# Patient Record
Sex: Male | Born: 1960 | Race: White | Hispanic: No | Marital: Married | Smoking: Former smoker
Health system: Southern US, Community
[De-identification: ages and names within clinical notes are randomized; demographics above are authoritative.]

## PROBLEM LIST (undated history)

## (undated) DIAGNOSIS — J189 Pneumonia, unspecified organism: Secondary | ICD-10-CM

## (undated) DIAGNOSIS — E119 Type 2 diabetes mellitus without complications: Secondary | ICD-10-CM

## (undated) DIAGNOSIS — I2510 Atherosclerotic heart disease of native coronary artery without angina pectoris: Secondary | ICD-10-CM

## (undated) DIAGNOSIS — I472 Ventricular tachycardia, unspecified: Secondary | ICD-10-CM

## (undated) DIAGNOSIS — Z87442 Personal history of urinary calculi: Secondary | ICD-10-CM

## (undated) DIAGNOSIS — E785 Hyperlipidemia, unspecified: Secondary | ICD-10-CM

## (undated) DIAGNOSIS — G4730 Sleep apnea, unspecified: Secondary | ICD-10-CM

## (undated) DIAGNOSIS — I4729 Other ventricular tachycardia: Secondary | ICD-10-CM

## (undated) DIAGNOSIS — I10 Essential (primary) hypertension: Secondary | ICD-10-CM

## (undated) DIAGNOSIS — E7800 Pure hypercholesterolemia, unspecified: Secondary | ICD-10-CM

## (undated) DIAGNOSIS — I252 Old myocardial infarction: Secondary | ICD-10-CM

## (undated) HISTORY — DX: Other ventricular tachycardia: I47.29

## (undated) HISTORY — DX: Essential (primary) hypertension: I10

## (undated) HISTORY — DX: Ventricular tachycardia, unspecified: I47.20

## (undated) HISTORY — PX: CORONARY STENT PLACEMENT: SHX1402

## (undated) HISTORY — DX: Pure hypercholesterolemia, unspecified: E78.00

## (undated) HISTORY — DX: Type 2 diabetes mellitus without complications: E11.9

## (undated) HISTORY — DX: Old myocardial infarction: I25.2

## (undated) HISTORY — DX: Ventricular tachycardia: I47.2

## (undated) HISTORY — DX: Pneumonia, unspecified organism: J18.9

## (undated) HISTORY — DX: Hyperlipidemia, unspecified: E78.5

## (undated) HISTORY — PX: CARDIAC CATHETERIZATION: SHX172

## (undated) HISTORY — DX: Atherosclerotic heart disease of native coronary artery without angina pectoris: I25.10

---

## 2001-05-17 ENCOUNTER — Inpatient Hospital Stay (HOSPITAL_COMMUNITY): Admission: EM | Admit: 2001-05-17 | Discharge: 2001-05-21 | Payer: Self-pay | Admitting: *Deleted

## 2001-05-17 ENCOUNTER — Encounter: Payer: Self-pay | Admitting: Nephrology

## 2004-04-26 ENCOUNTER — Ambulatory Visit (HOSPITAL_COMMUNITY): Admission: RE | Admit: 2004-04-26 | Discharge: 2004-04-26 | Payer: Self-pay | Admitting: Family Medicine

## 2004-04-26 ENCOUNTER — Emergency Department (HOSPITAL_COMMUNITY): Admission: EM | Admit: 2004-04-26 | Discharge: 2004-04-26 | Payer: Self-pay | Admitting: Family Medicine

## 2005-07-16 ENCOUNTER — Ambulatory Visit: Payer: Self-pay | Admitting: Cardiology

## 2005-07-16 ENCOUNTER — Inpatient Hospital Stay (HOSPITAL_COMMUNITY): Admission: EM | Admit: 2005-07-16 | Discharge: 2005-07-20 | Payer: Self-pay | Admitting: Emergency Medicine

## 2005-07-26 ENCOUNTER — Ambulatory Visit: Payer: Self-pay

## 2005-07-26 ENCOUNTER — Encounter: Payer: Self-pay | Admitting: Cardiology

## 2005-08-11 ENCOUNTER — Ambulatory Visit: Payer: Self-pay | Admitting: Cardiology

## 2005-08-25 ENCOUNTER — Ambulatory Visit: Payer: Self-pay | Admitting: Cardiology

## 2005-10-25 ENCOUNTER — Ambulatory Visit: Payer: Self-pay | Admitting: Cardiology

## 2005-11-17 ENCOUNTER — Ambulatory Visit: Payer: Self-pay | Admitting: Cardiology

## 2006-05-25 ENCOUNTER — Ambulatory Visit: Payer: Self-pay | Admitting: Cardiology

## 2006-05-27 ENCOUNTER — Ambulatory Visit: Payer: Self-pay | Admitting: Cardiology

## 2006-07-19 ENCOUNTER — Ambulatory Visit: Payer: Self-pay | Admitting: Cardiology

## 2006-07-19 LAB — CONVERTED CEMR LAB
ALT: 27 units/L (ref 0–40)
AST: 21 units/L (ref 0–37)
Albumin: 4.2 g/dL (ref 3.5–5.2)
Alkaline Phosphatase: 35 units/L — ABNORMAL LOW (ref 39–117)
Bilirubin, Direct: 0.1 mg/dL (ref 0.0–0.3)
HDL: 31.9 mg/dL — ABNORMAL LOW (ref >39.0)
Total Protein: 7.3 g/dL (ref 6.0–8.3)

## 2006-08-06 ENCOUNTER — Inpatient Hospital Stay (HOSPITAL_COMMUNITY): Admission: EM | Admit: 2006-08-06 | Discharge: 2006-08-09 | Payer: Self-pay | Admitting: Emergency Medicine

## 2006-08-08 ENCOUNTER — Ambulatory Visit: Payer: Self-pay | Admitting: Internal Medicine

## 2006-09-08 ENCOUNTER — Ambulatory Visit: Payer: Self-pay | Admitting: Cardiology

## 2006-09-14 ENCOUNTER — Ambulatory Visit: Payer: Self-pay | Admitting: Cardiology

## 2006-09-14 LAB — CONVERTED CEMR LAB
ALT: 28 units/L (ref 0–40)
AST: 17 units/L (ref 0–37)
HDL: 27.5 mg/dL — ABNORMAL LOW (ref >39.0)
Total Protein: 7 g/dL (ref 6.0–8.3)

## 2007-10-06 ENCOUNTER — Emergency Department (HOSPITAL_COMMUNITY): Admission: EM | Admit: 2007-10-06 | Discharge: 2007-10-06 | Payer: Self-pay | Admitting: Family Medicine

## 2009-01-13 ENCOUNTER — Telehealth: Payer: Self-pay | Admitting: Cardiology

## 2009-04-15 DIAGNOSIS — I2510 Atherosclerotic heart disease of native coronary artery without angina pectoris: Secondary | ICD-10-CM | POA: Insufficient documentation

## 2009-04-15 DIAGNOSIS — I10 Essential (primary) hypertension: Secondary | ICD-10-CM | POA: Insufficient documentation

## 2009-04-15 DIAGNOSIS — I252 Old myocardial infarction: Secondary | ICD-10-CM

## 2009-04-15 DIAGNOSIS — I472 Ventricular tachycardia: Secondary | ICD-10-CM

## 2009-04-15 DIAGNOSIS — E785 Hyperlipidemia, unspecified: Secondary | ICD-10-CM

## 2009-04-18 ENCOUNTER — Ambulatory Visit: Payer: Self-pay | Admitting: Cardiology

## 2009-04-22 ENCOUNTER — Ambulatory Visit: Payer: Self-pay | Admitting: Cardiology

## 2009-04-22 ENCOUNTER — Encounter (INDEPENDENT_AMBULATORY_CARE_PROVIDER_SITE_OTHER): Payer: Self-pay

## 2009-04-22 ENCOUNTER — Ambulatory Visit: Payer: Self-pay

## 2009-04-23 LAB — CONVERTED CEMR LAB
BUN: 16 mg/dL (ref 6–23)
Basophils Absolute: 0.1 10*3/uL (ref 0.0–0.1)
Eosinophils Absolute: 0.2 10*3/uL (ref 0.0–0.7)
Glucose, Bld: 120 mg/dL — ABNORMAL HIGH (ref 70–99)
HCT: 47.6 % (ref 39.0–52.0)
MCHC: 35.1 g/dL (ref 30.0–36.0)
MCV: 86.3 fL (ref 78.0–100.0)
Monocytes Absolute: 0.6 10*3/uL (ref 0.1–1.0)
Neutrophils Relative %: 57.2 % (ref 43.0–77.0)
Potassium: 3.9 meq/L (ref 3.5–5.1)
RDW: 12.6 % (ref 11.5–14.6)
Sodium: 140 meq/L (ref 135–145)

## 2009-04-24 ENCOUNTER — Observation Stay (HOSPITAL_COMMUNITY): Admission: AD | Admit: 2009-04-24 | Discharge: 2009-04-25 | Payer: Self-pay | Admitting: Orthopedic Surgery

## 2009-04-24 ENCOUNTER — Ambulatory Visit: Payer: Self-pay | Admitting: Cardiology

## 2009-04-30 ENCOUNTER — Encounter (INDEPENDENT_AMBULATORY_CARE_PROVIDER_SITE_OTHER): Payer: Self-pay | Admitting: *Deleted

## 2009-05-01 ENCOUNTER — Ambulatory Visit: Payer: Self-pay

## 2009-05-01 ENCOUNTER — Encounter: Payer: Self-pay | Admitting: Cardiology

## 2009-05-01 ENCOUNTER — Ambulatory Visit: Payer: Self-pay | Admitting: Cardiovascular Disease

## 2009-05-01 ENCOUNTER — Ambulatory Visit (HOSPITAL_COMMUNITY): Admission: RE | Admit: 2009-05-01 | Discharge: 2009-05-01 | Payer: Self-pay | Admitting: Cardiology

## 2009-05-02 ENCOUNTER — Encounter: Payer: Self-pay | Admitting: Cardiology

## 2009-05-07 ENCOUNTER — Encounter (INDEPENDENT_AMBULATORY_CARE_PROVIDER_SITE_OTHER): Payer: Self-pay | Admitting: *Deleted

## 2009-05-07 ENCOUNTER — Ambulatory Visit: Payer: Self-pay | Admitting: Cardiology

## 2009-07-16 ENCOUNTER — Telehealth: Payer: Self-pay | Admitting: Cardiology

## 2009-07-29 ENCOUNTER — Ambulatory Visit: Payer: Self-pay | Admitting: Cardiology

## 2009-09-16 ENCOUNTER — Ambulatory Visit: Payer: Self-pay | Admitting: Cardiology

## 2009-10-03 LAB — CONVERTED CEMR LAB: Alpha-1-Globulin: 6.8 % — ABNORMAL HIGH (ref 2.9–4.9)

## 2010-01-20 ENCOUNTER — Telehealth (INDEPENDENT_AMBULATORY_CARE_PROVIDER_SITE_OTHER): Payer: Self-pay | Admitting: *Deleted

## 2010-06-16 ENCOUNTER — Emergency Department (HOSPITAL_COMMUNITY)
Admission: EM | Admit: 2010-06-16 | Discharge: 2010-06-16 | Payer: Self-pay | Source: Home / Self Care | Admitting: Emergency Medicine

## 2010-06-24 ENCOUNTER — Ambulatory Visit
Admission: RE | Admit: 2010-06-24 | Discharge: 2010-06-24 | Payer: Self-pay | Source: Home / Self Care | Attending: Physician Assistant | Admitting: Physician Assistant

## 2010-06-24 ENCOUNTER — Encounter: Payer: Self-pay | Admitting: Physician Assistant

## 2010-06-24 DIAGNOSIS — F17200 Nicotine dependence, unspecified, uncomplicated: Secondary | ICD-10-CM | POA: Insufficient documentation

## 2010-06-24 DIAGNOSIS — R5381 Other malaise: Secondary | ICD-10-CM | POA: Insufficient documentation

## 2010-06-24 DIAGNOSIS — G4730 Sleep apnea, unspecified: Secondary | ICD-10-CM | POA: Insufficient documentation

## 2010-06-24 DIAGNOSIS — R5383 Other fatigue: Secondary | ICD-10-CM

## 2010-06-24 DIAGNOSIS — I517 Cardiomegaly: Secondary | ICD-10-CM | POA: Insufficient documentation

## 2010-07-10 ENCOUNTER — Ambulatory Visit
Admission: RE | Admit: 2010-07-10 | Discharge: 2010-07-10 | Payer: Self-pay | Source: Home / Self Care | Attending: Internal Medicine | Admitting: Internal Medicine

## 2010-07-10 ENCOUNTER — Ambulatory Visit
Admission: RE | Admit: 2010-07-10 | Discharge: 2010-07-10 | Payer: Self-pay | Source: Home / Self Care | Attending: Pulmonary Disease | Admitting: Pulmonary Disease

## 2010-07-10 DIAGNOSIS — J45909 Unspecified asthma, uncomplicated: Secondary | ICD-10-CM | POA: Insufficient documentation

## 2010-07-10 DIAGNOSIS — G4733 Obstructive sleep apnea (adult) (pediatric): Secondary | ICD-10-CM | POA: Insufficient documentation

## 2010-07-14 ENCOUNTER — Ambulatory Visit
Admission: RE | Admit: 2010-07-14 | Discharge: 2010-07-14 | Payer: Self-pay | Source: Home / Self Care | Attending: Internal Medicine | Admitting: Internal Medicine

## 2010-07-16 ENCOUNTER — Encounter: Payer: Self-pay | Admitting: Internal Medicine

## 2010-07-19 LAB — CONVERTED CEMR LAB
ALT: 32 units/L (ref 0–53)
AST: 17 units/L (ref 0–37)
Alkaline Phosphatase: 54 units/L (ref 39–117)

## 2010-07-21 NOTE — Assessment & Plan Note (Signed)
Summary: 3 mo f/u   Visit Type:  3 months follow up Primary Provider:  No PCP at this time  CC:  No complains.  History of Present Illness: Started walking and exercising and no chest pain.  Did two miles without difficulty.  Doing alot better.  Lots of recent family stress with death of a family member.  Really taking better care of self at this point.  Denies any significant chest pain.    Current Medications (verified): 1)  Crestor 20 Mg Tabs (Rosuvastatin Calcium) .... Take One Tablet By Mouth Daily. 2)  Aspirin Ec 325 Mg Tbec (Aspirin) .... Take One Tablet By Mouth Daily 3)  Nitrostat 0.4 Mg Subl (Nitroglycerin) .Marland Kitchen.. 1 Tablet Under Tongue At Onset of Chest Pain; You May Repeat Every 5 Minutes For Up To 3 Doses. 4)  Metoprolol Tartrate 25 Mg Tabs (Metoprolol Tartrate) .Marland Kitchen.. 1 Tab Two Times A Day 5)  Plavix 75 Mg Tabs (Clopidogrel Bisulfate) .Marland Kitchen.. 1 Tab Once Daily  Allergies (verified): No Known Drug Allergies  Vital Signs:  Patient profile:   50 year old male Height:      66 inches Weight:      196.25 pounds BMI:     31.79 Pulse rate:   71 / minute Pulse rhythm:   regular Resp:     18 per minute BP sitting:   122 / 88  (left arm) Cuff size:   large  Vitals Entered By: Vikki Ports (July 29, 2009 11:26 AM)  Physical Exam  General:  Well developed, well nourished, in no acute distress. Head:  normocephalic and atraumatic Lungs:  Clear bilaterally to auscultation and percussion. Heart:  PMI non displaced.  Normal S1 and S2.  No murmur, rub.  Pos S4 gallop.   Extremities:  No clubbing or cyanosis. Neurologic:  Alert and oriented x 3.   EKG  Procedure date:  07/29/2009  Findings:      NSR.  Nonspecific St abnormality.  Impression & Recommendations:  Problem # 1:  CAD (ICD-414.00) Remains stable.  Had DES for ISR with Cypher given lesion lenght.  Need for DAPT stressed to patient as well as reasons.  Continue medical therapy and reinforce good habits.   His  updated medication list for this problem includes:    Aspirin Ec 325 Mg Tbec (Aspirin) .Marland Kitchen... Take one tablet by mouth daily    Nitrostat 0.4 Mg Subl (Nitroglycerin) .Marland Kitchen... 1 tablet under tongue at onset of chest pain; you may repeat every 5 minutes for up to 3 doses.    Metoprolol Tartrate 25 Mg Tabs (Metoprolol tartrate) .Marland Kitchen... 1 tab two times a day    Plavix 75 Mg Tabs (Clopidogrel bisulfate) .Marland Kitchen... 1 tab once daily  Problem # 2:  HYPERTENSION, UNSPECIFIED (ICD-401.9) Reasonable control at present.  Continue current medications. His updated medication list for this problem includes:    Aspirin Ec 325 Mg Tbec (Aspirin) .Marland Kitchen... Take one tablet by mouth daily    Metoprolol Tartrate 25 Mg Tabs (Metoprolol tartrate) .Marland Kitchen... 1 tab two times a day  Problem # 3:  HYPERLIPIDEMIA (ICD-272.4) Check liver panel.   His updated medication list for this problem includes:    Crestor 20 Mg Tabs (Rosuvastatin calcium) .Marland Kitchen... Take one tablet by mouth daily.  Orders: TLB-Hepatic/Liver Function Pnl ([id number]-[id number])  Patient Instructions: 1)  Your physician recommends that you schedule a follow-up appointment in: 6 months with Dr. Riley Kill. 2)  Your physician recommends that you getr lab work today. 3)  Your  physician recommends that you continue on your current medications as directed. Please refer to the Current Medication list given to you today.

## 2010-07-21 NOTE — Progress Notes (Signed)
Summary: loss letter for gym, appt today  Phone Note Call from Patient Call back at Home Phone [phone]   Caller: Patient Reason for Call: Talk to Nurse Details for Reason: pt has appt today - gym. pt loss letter. need another Initial call taken by: Lorne Skeens,  July 16, 2009 12:49 PM  Follow-up for Phone Call        COPY OF LETTER LEFT AT FRONT DESK  PT'S WIFE AWARE Follow-up by: Scherrie Bateman, LPN,  July 16, 2009 1:12 PM

## 2010-07-21 NOTE — Progress Notes (Signed)
Summary: gym letter  Phone Note Call from Patient Call back at Home Phone [phone] Call back at [phone]   Caller: Sister/Debra Summary of Call: letter for gym has to be on prescription pad, please fax to [phone] Initial call taken by: Migdalia Dk,  July 16, 2009 2:38 PM  Follow-up for Phone Call        Phone Call Completed faxed note to gym. Follow-up by: Scherrie Bateman, LPN,  July 16, 2009 2:54 PM

## 2010-07-21 NOTE — Letter (Signed)
Summary: [facility]   Imported By: Kassie Mends 10/01/2009 10:35:48  _____________________________________________________________________  External Attachment:    Type:   Image     Comment:   External Document

## 2010-07-21 NOTE — Letter (Signed)
Summary: [facility] Progress Note  [facility]   Imported By: Roderic Ovens 09/25/2009 16:48:33  _____________________________________________________________________  External Attachment:    Type:   Image     Comment:   External Document

## 2010-07-21 NOTE — Progress Notes (Signed)
Summary: refill request  Phone Note Refill Request Message from:  Patient on January 20, 2010 9:02 AM  Refills Requested: Medication #1:  CRESTOR 20 MG TABS Take one tablet by mouth daily.  Medication #2:  METOPROLOL TARTRATE 25 MG TABS 1 tab two times a day  Medication #3:  PLAVIX 75 MG TABS 1 tab once daily. needs 3 month supply of all three meds, [facility] [address] [facility]pt changed insurance now has to get 34mo supply and is out of two of them now-call if any problems pt#[phone]   Method Requested: Telephone to Pharmacy Initial call taken by: Glynda Jaeger,  January 20, 2010 9:05 AM  Follow-up for Phone Call        Rx called to pharmacy. Vikki Ports  January 20, 2010 10:52 AM     Prescriptions: PLAVIX 75 MG TABS (CLOPIDOGREL BISULFATE) 1 tab once daily  #90 x 3   Entered by:   Vikki Ports   Authorized by:   Ronaldo Miyamoto, MD, [facility]   Signed by:   Vikki Ports on 01/20/2010   Method used:   [facility]ed to ...       [facility] [id number]       [address]       [place], [place]  [address]       Ph: [phone]       Fax: [phone]   RxID:   [id number] METOPROLOL TARTRATE 25 MG TABS (METOPROLOL TARTRATE) 1 tab two times a day  #180 x 3   Entered by:   Vikki Ports   Authorized by:   Ronaldo Miyamoto, MD, [facility]   Signed by:   Vikki Ports on 01/20/2010   Method used:   Faxed to ...       [facility] [id number]       [address]       [place], [place]  [address]       Ph: [phone]       Fax: [phone]   RxID:   [id number] CRESTOR 20 MG TABS (ROSUVASTATIN CALCIUM) Take one tablet by mouth daily.  #90 x 3   Entered by:   Vikki Ports   Authorized by:   Ronaldo Miyamoto, MD, [facility]   Signed by:   Vikki Ports on 01/20/2010   Method used:   Faxed to ...       [facility] [id number]       [address]       [place], [place]  [address]       Ph: [phone]       Fax: [phone]   RxID:   [id number]

## 2010-07-23 NOTE — Miscellaneous (Signed)
Summary: Orders Update pft charges   Clinical Lists Changes  Orders: Added new Service order of Carbon Monoxide diffusing w/capacity (94729) - Signed Added new Service order of Lung Volumes/Gas dilution or washout (94727) - Signed Added new Service order of Spirometry (Pre & Post) (94060) - Signed 

## 2010-07-23 NOTE — Assessment & Plan Note (Addendum)
Summary: pt went to ED for a cold & was told he has enlarged heart   Visit Type:  Follow-up Primary Provider:  No PCP at this time  CC:  no cardiac complaints.  History of Present Illness: Primary Cardiologist:  Dr. Shawnie Riggs  [name] is a 50 yo male with a h/o CAD, s/p stenting to the CFX and RCA in the past, last cath in 04/2009 demonstrating 90% in stent restenosis in the RCA which was treated with a DES (Cypher) and preserved LVF (EF 55%).  He recently presented to the emergency room and was diagnosed with acute bronchitis.  BNP was < 30 and CXR demonstrated cardiomegaly, no edema and bronchitic changes.  He was concerned about the cardiomegaly on his CXR and arranged this appt.  He is feeling much better since starting on tx for his bronchitis.  He is trying to quit smoking.  He denies chest discomfort reminiscent of his prior angina.  No arm or jaw pain.  He did note dyspnea with his bronchitis but this is getting better.  No orthopnea or PND.  No syncope.    Current Medications (verified): 1)  Crestor 20 Mg Tabs (Rosuvastatin Calcium) .... Take One Tablet By Mouth Daily. 2)  Aspirin Ec 325 Mg Tbec (Aspirin) .... Take One Tablet By Mouth Daily 3)  Nitrostat 0.4 Mg Subl (Nitroglycerin) .Marland Kitchen.. 1 Tablet Under Tongue At Onset of Chest Pain; You May Repeat Every 5 Minutes For Up To 3 Doses. 4)  Metoprolol Tartrate 25 Mg Tabs (Metoprolol Tartrate) .Marland Kitchen.. 1 Tab Two Times A Day 5)  Plavix 75 Mg Tabs (Clopidogrel Bisulfate) .Marland Kitchen.. 1 Tab Once Daily 6)  Prednisone 5 Mg Tabs (Prednisone) .... Use As Directed 7)  Iophen C-Nr 100-10 Mg/24ml Liqd (Guaifenesin-Codeine) .... Take 1 To 2 Teaspoons By Mouth Every 6 Hours As Needed For Cough 8)  Amoxicillin-Pot Clavulanate 875-125 Mg Tabs (Amoxicillin-Pot Clavulanate) .... Tak E1 Tablet Two Times A Day With Food 9)  Proair Hfa 108 [phone] Base) Mcg/act Aers (Albuterol Sulfate) .... Inhale 2 Puffs Every 4 Hours As Needed 10)  Acetaminophen-Codeine #3  300-30 Mg Tabs (Acetaminophen-Codeine) .... Take On or 2 Tablets Every 4 Ours As Needed  Allergies (verified): No Known Drug Allergies  Past History:  Past Medical History: Current Problems:  CAD (ICD-414.00)   a. s/p BMS to RCA and CFX   b.  s/p DES to RCA 2/2 ISR 2010 Good LVF HYPERTENSION, UNSPECIFIED (ICD-401.9) HYPERLIPIDEMIA (ICD-272.4) MYOCARDIAL INFARCTION, HX OF (ICD-412) VENTRICULAR TACHYCARDIA (ICD-427.1) HX  Social History: Reviewed history from 04/15/2009 and no changes required.  The patient works as a Education administrator.  He has a history of  tobacco abuse.  He denies alcohol abuse.  He is married.  Review of Systems       His wife reports h/o snoring and witnessed apneic episodes.  He describes daytime hypersomnolence and fatigue.  Otherwise, as per  the HPI.  All other systems reviewed and negative.   Vital Signs:  Patient profile:   50 year old male Height:      66 inches Weight:      199 pounds BMI:     32.24 Pulse rate:   89 / minute BP sitting:   132 / 74  (left arm)  Vitals Entered By: [name], CMA (June 24, 2010 9:35 AM)  Physical Exam  General:  Well nourished, well developed, in no acute distress HEENT: normal Neck: no JVD Cardiac:  normal S1, S2; RRR; no murmur  Lungs:  clear to auscultation bilaterally, no wheezing, rhonchi or rales Abd: soft, nontender, no hepatomegaly Ext: no edema Skin: warm and dry Neuro:  CNs 2-12 intact, no focal abnormalities noted    EKG  Procedure date:  06/24/2010  Findings:      Normal Sinus Rhythm Heart rate 89 Normal axis Nonspecific ST-T wave changes No significant change when compared to prior tracings.  Impression & Recommendations:  Problem # 1:  CARDIOMEGALY (ICD-429.3) I reviewed his prior CXRs with him and it appears his heart size is stable.  His echo in 2010 was notable for normal LVF and Grade 1 diast. dysfxn.  He had a negative BNP and no signs of CHF.  His breathing is improved with  treatment of his bronchitis.  I spent a lot of time explaining measurement of heart size on CXRs and when this is significant.  At this time, I do not think he needs any further testing.  I reassured him and his wife.  Problem # 2:  CAD (ICD-414.00) No angina. Continue ASA, Plavix and Statin.  Orders: EKG w/ Interpretation (93000)  Problem # 3:  HYPERTENSION, UNSPECIFIED (ICD-401.9) Controlled.  Orders: Primary Care Referral (Primary)  Problem # 4:  FATIGUE (ICD-780.79) Refer to sleep med specialist as I think he has symptoms of sleep apnea; espeially with mild CM noted on CXR.  Problem # 5:  SMOKER (ICD-305.1) He is committed to quiting.  Other Orders: Sleep Disorder Referral (Sleep Disorder)  Patient Instructions: 1)  David Newcomer, PA recommends for you to be evaluated by eith [name] or [name] for possible sleep apnea 2)  You have been referred to PRIMARY CARE AT OUR [facility] 3)  Your physician recommends that you continue on your current medications as directed. Please refer to the Current Medication list given to you today. 4)  Your physician recommends that you schedule a follow-up appointment in: 3 MONTHS WITH [name]

## 2010-07-23 NOTE — Assessment & Plan Note (Signed)
Summary: NEW/ [facility] / NWS  #   Vital Signs:  Patient profile:   50 year old male Height:      66 inches Weight:      198 pounds BMI:     32.07 O2 Sat:      92 % Temp:     98.5 degrees F oral Pulse rate:   92 / minute Pulse rhythm:   regular Resp:     16 per minute BP sitting:   120 / 72  (left arm) Cuff size:   regular  Vitals Entered By: Lanier Prude, CMA(AAMA) (July 10, 2010 1:22 PM)  Nutrition Counseling: Patient's BMI is greater than 25 and therefore counseled on weight management options. CC: Est PCP c/o recurrent cough, Cough Is Patient Diabetic? No   Primary Care Provider:  Yetta Barre  CC:  Est PCP c/o recurrent cough and Cough.  History of Present Illness:  Cough      This is a 50 year old man who presents with Cough.  The symptoms began 2 months ago.  The intensity is described as moderate.  The patient reports productive cough, shortness of breath, wheezing, and exertional dyspnea, but denies non-productive cough, pleuritic chest pain, fever, hemoptysis, and malaise.  Associated symtpoms include cold/URI symptoms, sore throat, nasal congestion, and chronic rhinitis.  The patient denies the following symptoms: weight loss, acid reflux symptoms, and peripheral edema.  The cough is worse with cold exposure.  Ineffective prior treatments have included throat lozenges and albuterol inhaler.  Risk factors include history of asthma.  Diagnostic testing to date has included CXR.  He tells me that he went to an [facility] about 3 weeks ago and was prescribed amoxil, prednisone, albuterol, a pain pill, and a cough med and was getting better until about 5 days ago when his symptons returned with a vengeance.  Preventive Screening-Counseling & Management  Alcohol-Tobacco     Alcohol drinks/day: 0     Alcohol Counseling: not indicated; patient does not drink     Smoking Status: current     Smoking Cessation Counseling: yes     Smoke Cessation Stage: precontemplative     Packs/Day:  2.0     Year Started: 1980     Pack years: 35     Tobacco Counseling: to quit use of tobacco products  Caffeine-Diet-Exercise     Does Patient Exercise: no  Hep-HIV-STD-Contraception     Hepatitis Risk: no risk noted     HIV Risk: no risk noted     STD Risk: no risk noted      Sexual History:  currently monogamous.        Drug Use:  no.        Blood Transfusions:  no.    Clinical Review Panels:  Lipid Management   Cholesterol:  102 (09/14/2006)   LDL (bad choesterol):  58 (09/14/2006)   HDL (good cholesterol):  27.5 (09/14/2006)  Diabetes Management   Creatinine:  0.8 (04/22/2009)  CBC   WBC:  9.1 (04/22/2009)   RBC:  5.51 (04/22/2009)   Hgb:  16.7 (04/22/2009)   Hct:  47.6 (04/22/2009)   Platelets:  228.0 (04/22/2009)   MCV  86.3 (04/22/2009)   MCHC  35.1 (04/22/2009)   RDW  12.6 (04/22/2009)   PMN:  57.2 (04/22/2009)   Lymphs:  32.9 (04/22/2009)   Monos:  6.8 (04/22/2009)   Eosinophils:  2.5 (04/22/2009)   Basophil:  0.6 (04/22/2009)  Complete Metabolic Panel   Glucose:  120 (04/22/2009)  Sodium:  140 (04/22/2009)   Potassium:  3.9 (04/22/2009)   Chloride:  104 (04/22/2009)   CO2:  27 (04/22/2009)   BUN:  16 (04/22/2009)   Creatinine:  0.8 (04/22/2009)   Albumin:  4.3 (07/29/2009)   Total Protein:  8.5 (07/29/2009)   Calcium:  9.4 (04/22/2009)   Total Bili:  0.5 (07/29/2009)   Alk Phos:  54 (07/29/2009)   SGPT (ALT):  32 (07/29/2009)   SGOT (AST):  17 (07/29/2009)   Medications Prior to Update: 1)  Crestor 20 Mg Tabs (Rosuvastatin Calcium) .... Take One Tablet By Mouth Daily. 2)  Aspirin Ec 325 Mg Tbec (Aspirin) .... Take One Tablet By Mouth Daily 3)  Nitrostat 0.4 Mg Subl (Nitroglycerin) .Marland Kitchen.. 1 Tablet Under Tongue At Onset of Chest Pain; You May Repeat Every 5 Minutes For Up To 3 Doses. 4)  Metoprolol Tartrate 25 Mg Tabs (Metoprolol Tartrate) .Marland Kitchen.. 1 Tab Two Times A Day 5)  Plavix 75 Mg Tabs (Clopidogrel Bisulfate) .Marland Kitchen.. 1 Tab Once Daily 6)  Proair  Hfa 108 (90 Base) Mcg/act Aers (Albuterol Sulfate) .... Inhale 2 Puffs Every 4 Hours As Needed  Current Medications (verified): 1)  Crestor 20 Mg Tabs (Rosuvastatin Calcium) .... Take One Tablet By Mouth Daily. 2)  Aspirin Ec 325 Mg Tbec (Aspirin) .... Take One Tablet By Mouth Daily 3)  Nitrostat 0.4 Mg Subl (Nitroglycerin) .Marland Kitchen.. 1 Tablet Under Tongue At Onset of Chest Pain; You May Repeat Every 5 Minutes For Up To 3 Doses. 4)  Metoprolol Tartrate 25 Mg Tabs (Metoprolol Tartrate) .Marland Kitchen.. 1 Tab Two Times A Day 5)  Plavix 75 Mg Tabs (Clopidogrel Bisulfate) .Marland Kitchen.. 1 Tab Once Daily 6)  Proair Hfa 108 (90 Base) Mcg/act Aers (Albuterol Sulfate) .... Inhale 2 Puffs Every 4 Hours As Needed 7)  Dulera 200-5 Mcg/act Aero (Mometasone Furo-Formoterol Fum) .... 2 Puffs Two Times A Day 8)  Avelox 400 Mg Tabs (Moxifloxacin Hcl) .... One By Mouth Once Daily For 10 Days 9)  Tussicaps 10-8 Mg Xr12h-Cap (Hydrocod Polst-Chlorphen Polst) .... One By Mouth Two Times A Day As Needed For Cough  Allergies (verified): No Known Drug Allergies  Past History:  Past Medical History: Current Problems:  CAD (ICD-414.00)   a. s/p BMS to RCA and CFX   b.  s/p DES to RCA 2/2 ISR 2010 Good LVF HYPERTENSION, UNSPECIFIED (ICD-401.9) HYPERLIPIDEMIA (ICD-272.4) MYOCARDIAL INFARCTION, HX OF (ICD-412) VENTRICULAR TACHYCARDIA (ICD-427.1) HX Asthma  Past Surgical History: Reviewed history from 04/15/2009 and no changes required.  stenting x2 in January of 2007.  The patient also had a right coronary stent in 2002.  Family History: Reviewed history from 04/15/2009 and no changes required.  His family history is positive for coronary artery disease.  Social History: Reviewed history from 04/15/2009 and no changes required.  The patient works as a Education administrator.  He has a history of  tobacco abuse.  He denies alcohol abuse.  He is married. Drug use-no Regular exercise-no Packs/Day:  2.0 Hepatitis Risk:  no risk noted HIV Risk:   no risk noted STD Risk:  no risk noted Sexual History:  currently monogamous Blood Transfusions:  no Drug Use:  no Does Patient Exercise:  no  Review of Systems       The patient complains of weight gain and prolonged cough.  The patient denies anorexia, fever, weight loss, decreased hearing, hoarseness, chest pain, syncope, peripheral edema, headaches, hemoptysis, abdominal pain, suspicious skin lesions, difficulty walking, depression, abnormal bleeding, enlarged lymph nodes, and angioedema.  General:  Complains of malaise and sleep disorder; denies chills, fatigue, fever, loss of appetite, sweats, and weakness. Resp:  Complains of cough, excessive snoring, hypersomnolence, shortness of breath, sputum productive, and wheezing; denies chest discomfort, chest pain with inspiration, coughing up blood, and pleuritic.  Physical Exam  General:  ruddy appearance, alert, well-developed, well-nourished, well-hydrated, appropriate dress, cooperative to examination, good hygiene, and overweight-appearing.   Head:  normocephalic, atraumatic, no abnormalities observed, and no abnormalities palpated.   Eyes:  vision grossly intact, pupils equal, and no injection.   Ears:  R ear normal and L ear normal.   Nose:  no external deformity, no nasal discharge, no mucosal pallor, no mucosal edema, no airflow obstruction, no intranasal foreign body, no nasal polyps, no nasal mucosal lesions, no mucosal friability, no active bleeding or clots, and no sinus percussion tenderness.   Mouth:  good dentition, no exudates, no posterior lymphoid hypertrophy, no postnasal drip, no pharyngeal crowing, no lesions, no aphthous ulcers, no erosions, no tongue abnormalities, no leukoplakia, no petechiae, and pharyngeal erythema.   Neck:  supple, full ROM, no masses, no thyromegaly, no thyroid nodules or tenderness, no JVD, normal carotid upstroke, no carotid bruits, no cervical lymphadenopathy, and no neck tenderness.   Lungs:   he has diffuse bilateral inspiratory wet rhonchi that clear with cough and some inspiratory wheezes, there are rales in the left base, he has good air movement. normal respiratory effort.   Heart:  normal rate, regular rhythm, no murmur, no gallop, no rub, and no JVD.   Abdomen:  soft, non-tender, normal bowel sounds, no distention, no masses, no guarding, no rigidity, no rebound tenderness, no abdominal hernia, no inguinal hernia, no hepatomegaly, and no splenomegaly.   Msk:  No deformity or scoliosis noted of thoracic or lumbar spine.   Pulses:  R and L carotid,radial,femoral,dorsalis pedis and posterior tibial pulses are full and equal bilaterally Extremities:  No clubbing, cyanosis, edema, or deformity noted with normal full range of motion of all joints.   Neurologic:  No cranial nerve deficits noted. Station and gait are normal. Plantar reflexes are down-going bilaterally. DTRs are symmetrical throughout. Sensory, motor and coordinative functions appear intact. Skin:  Intact without suspicious lesions or rashes Cervical Nodes:  no anterior cervical adenopathy and no posterior cervical adenopathy.   Axillary Nodes:  no R axillary adenopathy and no L axillary adenopathy.   Inguinal Nodes:  no R inguinal adenopathy and no L inguinal adenopathy.   Psych:  Cognition and judgment appear intact. Alert and cooperative with normal attention span and concentration. No apparent delusions, illusions, hallucinations   Impression & Recommendations:  Problem # 1:  COUGH (ICD-786.2) will check some studies to help distinguish between COPD, asthma, chronic bronchitis, pneumonia, mass , edema  Orders: T-2 View CXR (71020TC) Pulmonary Referral (Pulmonary)  Problem # 2:  ASTHMA (ICD-493.90) Assessment: Deteriorated  His updated medication list for this problem includes:    Proair Hfa 108 (90 Base) Mcg/act Aers (Albuterol sulfate) ..... Inhale 2 puffs every 4 hours as needed    Dulera 200-5 Mcg/act Aero  (Mometasone furo-formoterol fum) .Marland Kitchen... 2 puffs two times a day  Orders: Pulmonary Referral (Pulmonary) Tobacco use cessation intermediate 3-10 minutes (13086)  Problem # 3:  BRONCHITIS-ACUTE (ICD-466.0) Assessment: New  His updated medication list for this problem includes:    Proair Hfa 108 (90 Base) Mcg/act Aers (Albuterol sulfate) ..... Inhale 2 puffs every 4 hours as needed    Dulera 200-5 Mcg/act Aero (Mometasone furo-formoterol fum) .Marland KitchenMarland KitchenMarland KitchenMarland Kitchen  2 puffs two times a day    Avelox 400 Mg Tabs (Moxifloxacin hcl) ..... One by mouth once daily for 10 days    Tussicaps 10-8 Mg Xr12h-cap (Hydrocod polst-chlorphen polst) ..... One by mouth two times a day as needed for cough  Problem # 4:  SMOKER (ICD-305.1) Assessment: Deteriorated  Orders: Tobacco use cessation intermediate 3-10 minutes (99406)  Problem # 5:  HYPERTENSION, UNSPECIFIED (ICD-401.9) Assessment: Improved  His updated medication list for this problem includes:    Metoprolol Tartrate 25 Mg Tabs (Metoprolol tartrate) .Marland Kitchen... 1 tab two times a day  BP today: 120/72 Prior BP: 132/74 (06/24/2010)  Prior 10 Yr Risk Heart Disease: N/A (04/22/2009)  Labs Reviewed: K+: 3.9 (04/22/2009) Creat: : 0.8 (04/22/2009)   Chol: 102 (09/14/2006)   HDL: 27.5 (09/14/2006)   LDL: 58 (09/14/2006)   TG: 85 (09/14/2006)  Complete Medication List: 1)  Crestor 20 Mg Tabs (Rosuvastatin calcium) .... Take one tablet by mouth daily. 2)  Aspirin Ec 325 Mg Tbec (Aspirin) .... Take one tablet by mouth daily 3)  Nitrostat 0.4 Mg Subl (Nitroglycerin) .Marland Kitchen.. 1 tablet under tongue at onset of chest pain; you may repeat every 5 minutes for up to 3 doses. 4)  Metoprolol Tartrate 25 Mg Tabs (Metoprolol tartrate) .Marland Kitchen.. 1 tab two times a day 5)  Plavix 75 Mg Tabs (Clopidogrel bisulfate) .Marland Kitchen.. 1 tab once daily 6)  Proair Hfa 108 (90 Base) Mcg/act Aers (Albuterol sulfate) .... Inhale 2 puffs every 4 hours as needed 7)  Dulera 200-5 Mcg/act Aero (Mometasone  furo-formoterol fum) .... 2 puffs two times a day 8)  Avelox 400 Mg Tabs (Moxifloxacin hcl) .... One by mouth once daily for 10 days 9)  Tussicaps 10-8 Mg Xr12h-cap (Hydrocod polst-chlorphen polst) .... One by mouth two times a day as needed for cough  Patient Instructions: 1)  Please schedule a follow-up appointment in 1 month. 2)  Tobacco is very bad for your health and your loved ones! You Should stop smoking!. 3)  Stop Smoking Tips: Choose a Quit date. Cut down before the Quit date. decide what you will do as a substitute when you feel the urge to smoke(gum,toothpick,exercise). 4)  It is important that you exercise regularly at least 20 minutes 5 times a week. If you develop chest pain, have severe difficulty breathing, or feel very tired , stop exercising immediately and seek medical attention. 5)  You need to lose weight. Consider a lower calorie diet and regular exercise.  6)  Check your Blood Pressure regularly. If it is above 130/80: you should make an appointment. 7)  Take your antibiotic as prescribed until ALL of it is gone, but stop if you develop a rash or swelling and contact our office as soon as possible. 8)  Acute bronchitis symptoms for less than 10 days are not helped by antibiotics. take over the counter cough medications. call if no improvment in  5-7 days, sooner if increasing cough, fever, or new symptoms( shortness of breath, chest pain). Prescriptions: TUSSICAPS 10-8 MG XR12H-CAP (HYDROCOD POLST-CHLORPHEN POLST) One by mouth two times a day as needed for cough  #60 x 1   Entered and Authorized by:   Etta Grandchild MD   Signed by:   Etta Grandchild MD on 07/10/2010   Method used:   Print then Give to Patient   RxID:   [id number] AVELOX 400 MG TABS (MOXIFLOXACIN HCL) One by mouth once daily for 10 days  #10 x 1  Entered and Authorized by:   Etta Grandchild MD   Signed by:   Etta Grandchild MD on 07/10/2010   Method used:   Print then Give to Patient   RxID:    [id number] JYNWGN 200-5 MCG/ACT AERO (MOMETASONE FURO-FORMOTEROL FUM) 2 puffs two times a day  #8 inhs x 0   Entered and Authorized by:   Etta Grandchild MD   Signed by:   Etta Grandchild MD on 07/10/2010   Method used:   Samples Given   RxID:   [id number]    Orders Added: 1)  T-2 View CXR [71020TC] 2)  Pulmonary Referral [Pulmonary] 3)  Tobacco use cessation intermediate 3-10 minutes [99406] 4)  New Patient Level V [99205]

## 2010-07-29 NOTE — Assessment & Plan Note (Signed)
Summary: consult for possible osa   Copy to:  Shawnie Pons Primary Provider/Referring Provider:  No PCP at this time  CC:  Sleep Consult.  History of Present Illness: The pt is a 49y/o male who I have been asked to see for possible osa.  He has been noted to have loud snoring, as well as an abnormal breathing pattern during sleep.  He goes to bed around 11pm, and arises at 7-8am to start his day.  He awakens very frequently during the night, and his wife sees him sitting on the side of the bed very often.  He feels he is rested in the am's upon arising.  He stays very busy during the day in his job, but in the evening will fall asleep easily with tv and movies.  He has even fallen asleep in movie theatres.  On weekends he will fall asleep anytime he sits down.  He also notes sleepiness driving longer distances.  His epworth score today is 13, and his weight is up about 10 pounds over the last 2 years.    Current Medications (verified): 1)  Crestor 20 Mg Tabs (Rosuvastatin Calcium) .... Take One Tablet By Mouth Daily. 2)  Aspirin Ec 325 Mg Tbec (Aspirin) .... Take One Tablet By Mouth Daily 3)  Nitrostat 0.4 Mg Subl (Nitroglycerin) .Marland Kitchen.. 1 Tablet Under Tongue At Onset of Chest Pain; You May Repeat Every 5 Minutes For Up To 3 Doses. 4)  Metoprolol Tartrate 25 Mg Tabs (Metoprolol Tartrate) .Marland Kitchen.. 1 Tab Two Times A Day 5)  Plavix 75 Mg Tabs (Clopidogrel Bisulfate) .Marland Kitchen.. 1 Tab Once Daily 6)  Proair Hfa 108 (90 Base) Mcg/act Aers (Albuterol Sulfate) .... Inhale 2 Puffs Every 4 Hours As Needed  Allergies (verified): No Known Drug Allergies  Past History:  Past Medical History:  CAD (ICD-414.00)   a. s/p BMS to RCA and CFX   b.  s/p DES to RCA 2/2 ISR 2010 Good LVF HYPERTENSION, UNSPECIFIED (ICD-401.9) HYPERLIPIDEMIA (ICD-272.4) MYOCARDIAL INFARCTION, HX OF (ICD-412) VENTRICULAR TACHYCARDIA (ICD-427.1) HX  Past Surgical History:  stenting x2 in January of 2007 and Oct 2010.  The patient  also had a right coronary stent in 2002.  Family History: Reviewed history from 04/15/2009 and no changes required. heart disease: mother, siblings  Social History: Reviewed history from 04/15/2009 and no changes required.  The patient works as a Camera operator.   He has a history of  tobacco abuse.  current smoker.  1/2 ppd.  started at age 14. He denies alcohol abuse.   He is married and lives with wife, Hildebran Sink. Pt has children.  Review of Systems       The patient complains of shortness of breath with activity, shortness of breath at rest, productive cough, chest pain, tooth/dental problems, headaches, and hand/feet swelling.  The patient denies non-productive cough, coughing up blood, irregular heartbeats, acid heartburn, indigestion, loss of appetite, weight change, abdominal pain, difficulty swallowing, sore throat, nasal congestion/difficulty breathing through nose, sneezing, itching, ear ache, anxiety, depression, joint stiffness or pain, rash, change in color of mucus, and fever.    Vital Signs:  Patient profile:   50 year old male Height:      66 inches Weight:      198 pounds BMI:     32.07 O2 Sat:      94 % on Room air Temp:     97.4 degrees F oral Pulse rate:   93 / minute BP sitting:   114 /  70  (left arm) Cuff size:   large  Vitals Entered By: Arman Filter LPN (July 10, 2010 10:27 AM)  O2 Flow:  Room air CC: Sleep Consult Comments Medications reviewed with patient Arman Filter LPN  July 10, 2010 10:27 AM    Physical Exam  General:  ow male in nad Eyes:  PERRLA and EOMI.   Nose:  deviated septum to left with near obstruction right with mucosal edema but patent Mouth:  small OP, elongation of soft palate, normal uvula Crowding posteriorly Neck:  no jvd, tmg, LN Lungs:  a few rhonchi, o/w clear Heart:  rrr, no mrg Abdomen:  soft and nontender, bs+ Extremities:  no edema or cyanosis  pulses intact distally Neurologic:  awake, but  does appear very sleepy moves all 4   Impression & Recommendations:  Problem # 1:  OBSTRUCTIVE SLEEP APNEA (ICD-327.23) the pt's history is very suggestive of significant sleep apnea.  He has an abnormal breathing pattern during sleep, loud snoring, frequent awakenings, and definite EDS.  I have had a long discussion with the pt about sleep apnea, including its impact on QOL and CV health.  I think he needs to have a sleep study for diagnosis, and the pt is agreeable.    Medications Added to Medication List This Visit: 1)  Crestor 20 Mg Tabs (Rosuvastatin calcium) .... Take one tablet by mouth daily.  Other Orders: Consultation Level IV (16109) Sleep Disorder Referral (Sleep Disorder)  Patient Instructions: 1)  will schedule for a sleep study, and will arrange followup  to discuss results once available.

## 2010-08-04 ENCOUNTER — Encounter: Payer: Self-pay | Admitting: Pulmonary Disease

## 2010-08-04 ENCOUNTER — Encounter (HOSPITAL_BASED_OUTPATIENT_CLINIC_OR_DEPARTMENT_OTHER): Payer: Self-pay

## 2010-08-04 ENCOUNTER — Ambulatory Visit (HOSPITAL_BASED_OUTPATIENT_CLINIC_OR_DEPARTMENT_OTHER): Payer: PRIVATE HEALTH INSURANCE | Attending: Pulmonary Disease

## 2010-08-04 DIAGNOSIS — G4733 Obstructive sleep apnea (adult) (pediatric): Secondary | ICD-10-CM | POA: Insufficient documentation

## 2010-08-19 DIAGNOSIS — G4710 Hypersomnia, unspecified: Secondary | ICD-10-CM

## 2010-08-19 DIAGNOSIS — G4730 Sleep apnea, unspecified: Secondary | ICD-10-CM

## 2010-08-20 ENCOUNTER — Telehealth (INDEPENDENT_AMBULATORY_CARE_PROVIDER_SITE_OTHER): Payer: Self-pay | Admitting: *Deleted

## 2010-08-26 ENCOUNTER — Encounter: Payer: Self-pay | Admitting: Pulmonary Disease

## 2010-08-26 ENCOUNTER — Ambulatory Visit (INDEPENDENT_AMBULATORY_CARE_PROVIDER_SITE_OTHER): Payer: PRIVATE HEALTH INSURANCE | Admitting: Pulmonary Disease

## 2010-08-26 DIAGNOSIS — G4733 Obstructive sleep apnea (adult) (pediatric): Secondary | ICD-10-CM

## 2010-08-27 NOTE — Progress Notes (Signed)
Summary: need to sched ov with kc for sleep study results.   Phone Note Outgoing Call   Call placed by: Arman Filter LPN,  August 20, 2010 11:50 AM Call placed to: Patient Summary of Call: pt needs ov with kc to discuss sleep study results.   spoke with pt's wife.  wife scheduled pt an appt with kc for 08-26-2010 at 4:15pm. Initial call taken by: Arman Filter LPN,  August 20, 2010 11:54 AM

## 2010-08-31 LAB — BRAIN NATRIURETIC PEPTIDE: Pro B Natriuretic peptide (BNP): <30 pg/mL (ref 0.0–100.0)

## 2010-09-08 NOTE — Assessment & Plan Note (Signed)
Summary: ov to discuss sleep study results/mg   Copy to:  Shawnie Pons Primary Provider/Referring Provider:  Yetta Barre  CC:  Ov to discuss sleep study results.  .  History of Present Illness: the pt comes in today for f/u of his recent sleep study.  He was found to have mild osa with AHI 13/hr, and transient desat as low as 77%.  I have reviewed the study in detail with the pt, and answered all of their questions.   Medications Prior to Update: 1)  Crestor 20 Mg Tabs (Rosuvastatin Calcium) .... Take One Tablet By Mouth Daily. 2)  Aspirin Ec 325 Mg Tbec (Aspirin) .... Take One Tablet By Mouth Daily 3)  Nitrostat 0.4 Mg Subl (Nitroglycerin) .Marland Kitchen.. 1 Tablet Under Tongue At Onset of Chest Pain; You May Repeat Every 5 Minutes For Up To 3 Doses. 4)  Metoprolol Tartrate 25 Mg Tabs (Metoprolol Tartrate) .Marland Kitchen.. 1 Tab Two Times A Day 5)  Plavix 75 Mg Tabs (Clopidogrel Bisulfate) .Marland Kitchen.. 1 Tab Once Daily 6)  Proair Hfa 108 (90 Base) Mcg/act Aers (Albuterol Sulfate) .... Inhale 2 Puffs Every 4 Hours As Needed 7)  Dulera 200-5 Mcg/act Aero (Mometasone Furo-Formoterol Fum) .... 2 Puffs Two Times A Day 8)  Tussicaps 10-8 Mg Xr12h-Cap (Hydrocod Polst-Chlorphen Polst) .... One By Mouth Two Times A Day As Needed For Cough  Allergies (verified): No Known Drug Allergies  Review of Systems       The patient complains of productive cough and change in color of mucus.  The patient denies shortness of breath with activity, shortness of breath at rest, non-productive cough, coughing up blood, chest pain, irregular heartbeats, acid heartburn, indigestion, loss of appetite, weight change, abdominal pain, difficulty swallowing, sore throat, tooth/dental problems, headaches, nasal congestion/difficulty breathing through nose, sneezing, itching, ear ache, anxiety, depression, hand/feet swelling, joint stiffness or pain, rash, and fever.    Vital Signs:  Patient profile:   50 year old male Height:      66 inches Weight:       196.50 pounds BMI:     31.83 O2 Sat:      95 % on Room air Temp:     97.9 degrees F oral Pulse rate:   86 / minute BP sitting:   118 / 80  (left arm) Cuff size:   regular  Vitals Entered By: Arman Filter LPN (August 25, 8117 4:06 PM)  O2 Flow:  Room air CC: Ov to discuss sleep study results.   Comments Medications reviewed with patient Arman Filter LPN  August 25, 1476 4:06 PM    Physical Exam  General:  ow male in nad  Extremities:  no edema or cyanosis  Neurologic:  awake, but appears sleepy, moves all 4    Impression & Recommendations:  Problem # 1:  OBSTRUCTIVE SLEEP APNEA (ICD-327.23) the pt has mild osa by his recent sleep study, but has significant symptoms of sleep disruption and daytime sleepiness.  I have reviewed the various treatments for mild osa, including weight loss, ua surgery, dental appliance, and cpap.  After a long discussion, the pt has decided to try cpap given his symptoms.  I will set the patient up on cpap at a moderate pressure level to allow for desensitization, and will troubleshoot the device over the next 4-6weeks if needed.  The pt is to call me if having issues with tolerance.  Will then optimize the pressure once patient is able to wear cpap on a consistent basis.  Other Orders: Consultation Level IV (16109) DME Referral (DME) Est. Patient Level III (60454)  Patient Instructions: 1)  will try on cpap over the next 6 weeks and see how you do. Let me know if issues with tolerance. 2)  work on weight loss 3)  followup with me in 6weeks.

## 2010-09-15 ENCOUNTER — Encounter: Payer: Self-pay | Admitting: Cardiology

## 2010-09-17 ENCOUNTER — Ambulatory Visit (INDEPENDENT_AMBULATORY_CARE_PROVIDER_SITE_OTHER): Payer: PRIVATE HEALTH INSURANCE | Admitting: Cardiology

## 2010-09-17 ENCOUNTER — Encounter: Payer: Self-pay | Admitting: Cardiology

## 2010-09-17 DIAGNOSIS — F17200 Nicotine dependence, unspecified, uncomplicated: Secondary | ICD-10-CM

## 2010-09-17 DIAGNOSIS — E785 Hyperlipidemia, unspecified: Secondary | ICD-10-CM

## 2010-09-17 DIAGNOSIS — E7800 Pure hypercholesterolemia, unspecified: Secondary | ICD-10-CM

## 2010-09-17 DIAGNOSIS — I2510 Atherosclerotic heart disease of native coronary artery without angina pectoris: Secondary | ICD-10-CM

## 2010-09-17 LAB — HEPATIC FUNCTION PANEL
Albumin: 4.6 g/dL (ref 3.5–5.2)
Total Bilirubin: 0.3 mg/dL (ref 0.3–1.2)
Total Protein: 8 g/dL (ref 6.0–8.3)

## 2010-09-17 LAB — LIPID PANEL
Cholesterol: 170 mg/dL (ref 0–200)
Total CHOL/HDL Ratio: 5
Triglycerides: 285 mg/dL — ABNORMAL HIGH (ref 0.0–149.0)

## 2010-09-17 NOTE — Patient Instructions (Signed)
Your physician recommends that you have a FASTING lipid and liver profile today.  Your physician has recommended you make the following change in your medication: DECREASE Aspirin to 81mg  one daily.   Your physician wants you to follow-up in: 6 MONTHS.  You will receive a reminder letter in the mail two months in advance. If you don't receive a letter, please call our office to schedule the follow-up appointment.

## 2010-09-17 NOTE — Assessment & Plan Note (Signed)
Improved with CPAP.

## 2010-09-17 NOTE — Assessment & Plan Note (Signed)
No angina.  Told to reduce ASA to 81mg .  Prevention of SCD discussed with patient.

## 2010-09-17 NOTE — Assessment & Plan Note (Signed)
Counseled to ways to stop and importance. CXR shown to patient which shows findings of COPD.

## 2010-09-17 NOTE — Assessment & Plan Note (Signed)
Needs lipid and liver.  Reviewed in detail.

## 2010-09-17 NOTE — Progress Notes (Signed)
HPI:  Patient seen in followup.  Doing well. Counseled on smoking after he said that in fact that he is smoking again.  We spent from 3-10 minutes discussing this in detail.  Denies any chest pain.  Feels good overall.  He thinks CPAP has helped him a lot. Sleeping through the night.  Current Outpatient Prescriptions  Medication Sig Dispense Refill  . aspirin 325 MG EC tablet Take 325 mg by mouth daily.        . clopidogrel (PLAVIX) 75 MG tablet Take 75 mg by mouth daily.        . metoprolol tartrate (LOPRESSOR) 25 MG tablet Take 25 mg by mouth 2 (two) times daily.        . nitroGLYCERIN (NITROSTAT) 0.4 MG SL tablet Place 0.4 mg under the tongue every 5 (five) minutes as needed.        . rosuvastatin (CRESTOR) 20 MG tablet Take 20 mg by mouth daily.        Marland Kitchen albuterol (PROAIR HFA) 108 (90 BASE) MCG/ACT inhaler Inhale 2 puffs into the lungs every 6 (six) hours as needed.        Marland Kitchen DISCONTD: Hydrocod Polst-Chlorphen Polst (TUSSICAPS) 10-8 MG CP12 Take 1 capsule by mouth 2 (two) times daily as needed.        Marland Kitchen DISCONTD: Mometasone Furo-Formoterol Fum (DULERA) 200-5 MCG/ACT AERO Inhale 2 puffs into the lungs 2 (two) times daily.          Allergies  Allergen Reactions  . Heparin     Low bp    Past Medical History  Diagnosis Date  . Coronary artery disease     s/p BMS to RCA and CFX. s/p DES to RCA 2/2 ISR 2010  . Hypertension   . Hyperlipidemia   . Old myocardial infarction   . Paroxysmal ventricular tachycardia   . Asthma     Past Surgical History  Procedure Date  . Coronary stent placement 2002    right    Family History  Problem Relation Age of Onset  . Heart disease Mother     an siblings    History   Social History  . Marital Status: Married    Spouse Name: N/A    Number of Children: N/A  . Years of Education: N/A   Occupational History  . painter    Social History Main Topics  . Smoking status: Current Everyday Smoker -- 1.5 packs/day for 30 years    Types:  Cigarettes  . Smokeless tobacco: Never Used  . Alcohol Use: No  . Drug Use: No  . Sexually Active: Not on file   Other Topics Concern  . Not on file   Social History Narrative  . No narrative on file    ROS: Please see the HPI.  All other systems reviewed and negative.  PHYSICAL EXAM:  BP 132/89  Pulse 82  Ht 5\' 6"  (1.676 m)  Wt 191 lb (86.637 kg)  BMI 30.83 kg/m2  General: Well developed, well nourished, in no acute distress. Head:  Normocephalic and atraumatic. Neck: no JVD Lungs: Clear to auscultation and percussion. Heart: Normal S1 and S2.  No murmur, rubs or gallops.  Abdomen:  Normal bowel sounds; soft; non tender; no organomegaly Pulses: Pulses normal in all 4 extremities. Extremities: No clubbing or cyanosis. No edema. Neurologic: Alert and oriented x 3.  EKG:  ASSESSMENT AND PLAN:

## 2010-09-23 LAB — CBC
Hemoglobin: 15.9 g/dL (ref 13.0–17.0)
MCHC: 34.7 g/dL (ref 30.0–36.0)
Platelets: 205 10*3/uL (ref 150–400)
RBC: 5.42 MIL/uL (ref 4.22–5.81)
RDW: 13.6 % (ref 11.5–15.5)
WBC: 10.1 10*3/uL (ref 4.0–10.5)

## 2010-09-23 LAB — BASIC METABOLIC PANEL
Chloride: 99 mEq/L (ref 96–112)
Creatinine, Ser: 0.63 mg/dL (ref 0.4–1.5)
GFR calc Af Amer: >60 mL/min (ref >60)
Potassium: 3.7 mEq/L (ref 3.5–5.1)

## 2010-09-23 LAB — CARDIAC PANEL(CRET KIN+CKTOT+MB+TROPI)
Total CK: 136 U/L (ref 7–232)
Troponin I: 0.01 ng/mL (ref 0.00–0.06)

## 2010-10-01 ENCOUNTER — Encounter: Payer: Self-pay | Admitting: Pulmonary Disease

## 2010-10-06 ENCOUNTER — Encounter: Payer: Self-pay | Admitting: Pulmonary Disease

## 2010-10-07 ENCOUNTER — Ambulatory Visit: Payer: PRIVATE HEALTH INSURANCE | Admitting: Pulmonary Disease

## 2010-10-15 ENCOUNTER — Telehealth: Payer: Self-pay

## 2010-10-15 DIAGNOSIS — E7800 Pure hypercholesterolemia, unspecified: Secondary | ICD-10-CM

## 2010-10-15 DIAGNOSIS — I2510 Atherosclerotic heart disease of native coronary artery without angina pectoris: Secondary | ICD-10-CM

## 2010-10-15 MED ORDER — ROSUVASTATIN CALCIUM 40 MG PO TABS: 40.0000 mg | ORAL_TABLET | Freq: Every day | ORAL | Status: DC

## 2010-10-15 NOTE — Telephone Encounter (Signed)
Pt aware of results by phone. The pt will increase Crestor to 40mg  daily and have his labs rechecked the week of 11/30/10 (lipid and liver

## 2010-10-15 NOTE — Telephone Encounter (Signed)
Message copied by Julieta Gutting on Thu Oct 15, 2010  5:50 PM ------      Message from: Shawnie Pons      Created: Sun Oct 04, 2010 11:38 AM       He likely needs to increase Crestor to 40mg  unless there is an issue regarding tolerability.  He should have repeat lipid and liver in six weeks if he is in agreement to change the dose.  TS

## 2010-10-22 ENCOUNTER — Encounter: Payer: Self-pay | Admitting: Pulmonary Disease

## 2010-10-22 ENCOUNTER — Ambulatory Visit (INDEPENDENT_AMBULATORY_CARE_PROVIDER_SITE_OTHER): Payer: PRIVATE HEALTH INSURANCE | Admitting: Pulmonary Disease

## 2010-10-22 VITALS — BP 130/86 | HR 78 | Temp 97.5°F | Ht 66.0 in | Wt 196.0 lb

## 2010-10-22 DIAGNOSIS — G4733 Obstructive sleep apnea (adult) (pediatric): Secondary | ICD-10-CM

## 2010-10-22 NOTE — Progress Notes (Signed)
  Subjective:    Patient ID: [name], male    DOB: 06/21/1961, 50 y.o.   MRN: [id number]  HPI The pt comes in today for f/u of his osa.  He was started on cpap last visit, and put on auto with limits of 5-15cm.  His download shows great compliance, but his pressure stayed the majority of the time at the max of 15cm.  He also having issues with intermittant mask leaking which aggrevates his sleep.  The pt feels his sleep is much improved on the device, and his alertness is better as well during the day.  He is happy with his response to treatment.    Review of Systems  Constitutional: Negative for fever and unexpected weight change.  HENT: Positive for dental problem. Negative for ear pain, nosebleeds, congestion, sore throat, rhinorrhea, sneezing, trouble swallowing, postnasal drip and sinus pressure.   Eyes: Positive for redness. Negative for itching.  Respiratory: Negative for cough, chest tightness, shortness of breath and wheezing.   Cardiovascular: Negative for palpitations and leg swelling.  Gastrointestinal: Negative for nausea and vomiting.  Genitourinary: Negative for dysuria.  Musculoskeletal: Negative for joint swelling.  Skin: Negative for rash.  Neurological: Negative for headaches.  Hematological: Bruises/bleeds easily.  Psychiatric/Behavioral: Negative for dysphoric mood. The patient is not nervous/anxious.        Objective:   Physical Exam Ow male in nad No skin breakdown or pressure necrosis from cpap mask. No LE edema or cyanosis Awake, appears alert, moves all 4        Assessment & Plan:

## 2010-10-22 NOTE — Patient Instructions (Signed)
Will have your DME work on mask fit.  Let me know if this is not successful Will put machine on auto with pressure limits up to 20 for 2 weeks, and I will call with results of download Work on weight loss followup with me in 6mos

## 2010-10-22 NOTE — Assessment & Plan Note (Addendum)
The pt has had excellent compliance by his download, and feels he is sleeping much better with improved daytime alertness.  His auto study shows he was at max pressure of 15cm almost all nights.  Will therefore redo auto study with upper limit of 20cm instead.  He also has had mask leak issues at times, and will have dme work on improved fit.  I have encouraged him to work on weight loss.

## 2010-10-26 ENCOUNTER — Encounter: Payer: Self-pay | Admitting: Pulmonary Disease

## 2010-10-29 NOTE — Letter (Signed)
Oct 28, 2010   To whom it may concern:  RE:  RIVER, AMBROSIO MRN:  [id number]  /  DOB:  12-Jan-1961  I have been asked to write a letter on behalf of Mr. [name], a patient I follow for obstructive sleep apnea.  He has recently had a nocturnal polysomnogram which shows mild obstructive sleep apnea with an apnea/hypopnea index of 13 events per hour, but has oxygen desaturation as low as 77%.  Mr. Attridge has been noted to have loud snoring and also frequent apneas during the night by his wife.  He has frequent awakenings and his wife has noticed that he will often suddenly sit up on the side of the bed with breathing issues.  The patient also notes significant daytime sleepiness with periods of inactivity, as well as driving.  The patient has known coronary artery disease with myocardial infarction, as well as a history of ventricular tachycardia.  As a board certified sleep specialist, I would highly recommend the patient maintain on CPAP therapy.  He is very symptomatic both at night, during the day, poses a risk to others while driving, and has underlying health issues that are significantly affected by sleep disordered breathing.  This has been documented in many studies in the medical literature.  If I can be of further service or provide any other information that would allow you to have a favorable decision concerning his CPAP therapy, please feel free to call.   Sincerely,     Barbaraann Share, MD,FCCP Electronically Signed   [facility]/MedQ  DD: 10/28/2010  DT: 10/29/2010  Job #: [id number]

## 2010-11-06 NOTE — H&P (Signed)
NAMEMAKEL, MCMANN NO.:  [id number]   MEDICAL RECORD NO.:  [id number]          PATIENT TYPE:  EMS   LOCATION:  [facility]                         FACILITY:  [facility]   PHYSICIAN:  Doylene Canning. Ladona Ridgel, MD    DATE OF BIRTH:  1961-04-24   DATE OF ADMISSION:  08/06/2006  DATE OF DISCHARGE:                              HISTORY & PHYSICAL   ADMISSION DIAGNOSIS:  Chest pain consistent with unstable angina.   HISTORY OF PRESENT ILLNESS:  The patient is a 50 year old man with known  coronary artery disease, status post stenting in January 2007.  He has a  history of coronary artery disease with right coronary artery stenting  in 2002.  He has a history of dyslipidemia, noncompliance.  He was  recently seen by Dr. Riley Kill and had a stress test done which apparently  was not having any evidence for worsening coronary disease.  This was  carried out in December 2007.  The patient had been exercising on a  regular basis.  He awoke this morning with substernal chest pain, just  like what he had prior to his prior stents and presented to the  emergency department, where he has been treated with heparin and  morphine with improvement of symptoms.  The patient notes pain in his  jaw, and neck, and his upper chest.  He denies radiation to his arms.  He has minimal dyspnea.  He denies nausea, vomiting, or diaphoresis.  Additional past medical history notable for severe dyslipidemia for  which he is on a Statin drug therapy.   His family history is positive for coronary artery disease.   SOCIAL HISTORY:  The patient works as a Education administrator.  He has a history of  tobacco abuse.  He denies alcohol abuse.  He is married.   REVIEW OF SYSTEMS:  Unremarkable, except as stated in the HPI.   PHYSICAL EXAMINATION:  GENERAL:  He is a pleasant, middle-aged man who  looks somewhat older than his stated age of 83.  VITAL SIGNS:  His blood pressure was 141/95, pulse is 84 and regular,  respirations are  22, temperature is 98.  HEENT:  Normocephalic, atraumatic.  Pupils equal and round.  Oropharynx  moist.  Sclerae anicteric.  NECK:  Revealed no jugular distension.  There is no thyromegaly.  Trachea is midline.  The carotid were 2+ and symmetric.  Trachea was  midline.  LUNGS:  Clear bilaterally to auscultation.  There are no wheezes, rales  or rhonchi.  There is no increased work of breathing.  CARDIOVASCULAR:  Revealed a regular rate and rhythm with a normal S1 and  S2.  There was a fairly prominent S4 gallop present.  There are no  obvious murmurs.  The PMI was not enlarged nor laterally displaced.  ABDOMEN:  Soft, nontender, nondistended.  There is no organomegaly.  Bowel sounds are present.  There is no rebound or guarding.  EXTREMITIES:  Demonstrated no cyanosis, clubbing or edema.  The pulses  were 2+ symmetric.  NEUROLOGIC:  Alert and oriented x3.  Cranial nerves intact.  Strength  5/5 and symmetric.  MEDICATIONS:  Crestor, aspirin, metoprolol, and folate.   The EKG demonstrates a sinus rhythm with no acute ST-T wave changes.   IMPRESSION:  1. Chest pain consistent with unstable angina.  2. Known coronary artery disease, status post stenting x2.  3. Dyslipidemia.  4. History of medical noncompliance of   DISCUSSION:  I have recommended that we admit the patient to the  hospital, obtain serial cardiac enzymes.  As he recently (less than 2  months ago) had a catheterization and because of recurrent chest pain,  we might consider proceeding with catheterization again to define his  anatomy.   Now, we will treat him with morphine, aspirin, nitro, Plavix, and  Lovenox.      Doylene Canning. Ladona Ridgel, MD  Electronically Signed     GWT/MEDQ  D:  08/06/2006  T:  08/06/2006  Job:  [id number]

## 2010-11-06 NOTE — Procedures (Signed)
[facility]                              EXERCISE TREADMILL   NAME:FIELDSLeah, Skora                       MRN:          [id number]  DATE:05/27/2006                            DOB:          01-28-1961    EXERCISE TOLERANCE TEST:  Duration of exercise was 7 minutes 20 seconds.  Maximum heart rate 142, 81% of age-predicted maximum.   COMMENTS:  Mr. Dibiasio exercised today on the Bruce protocol.  Exercise  tolerance was good.  He experienced no chest pain.  The test was  terminated due to volitional fatigue.  There was no leg discomfort.   The electrocardiogram at baseline reveals normal sinus rhythm with minor  nonspecific ST abnormality.  At maximum stress, there was no significant  horizontal ST depression.  There was no ventricular ectopy.  The study  was felt to be nondiagnostic due to failure to achieve 85% of age-  predicted maximum, but without evidence of exercise-induced ischemia at  a good work load.   Mr. Mclester has had some atypical chest pain.  At maximum exercise today,  he had no evidence of chest discomfort.  His exercise tolerance was  fairly good.  I have encouraged him to remain active and do aerobic  exercise.  We did discuss his deer hunting.  It is very important that  he wants to do this.  I have encouraged him to avoid the extreme cold  associated with this.  He is to return to see me in one month in  followup, or sooner, should he have further symptoms.     Arturo Morton. Riley Kill, MD, [facility]  Electronically Signed    TDS/MedQ  DD: 05/27/2006  DT: 05/27/2006  Job #: [id number]

## 2010-11-06 NOTE — Cardiovascular Report (Signed)
NAMETREBOR, GALDAMEZ NO.:  [id number]   MEDICAL RECORD NO.:  [id number]          PATIENT TYPE:  INP   LOCATION:  6522                         FACILITY:  [facility]   PHYSICIAN:  Arturo Morton. Riley Kill, M.D. [facility] OF BIRTH:  04/12/1961   DATE OF PROCEDURE:  07/19/2005  DATE OF DISCHARGE:                              CARDIAC CATHETERIZATION   INDICATIONS:  Mr. Varden is a 50 year old gentleman who previously underwent  percutaneous stenting of the right coronary artery with non-DES stents in  2002. He has remained stable but over the last year has developed  progressive exertional angina. He was brought to catheterization laboratory  today for further evaluation. Importantly, the patient has resumed smoking.  He has also discontinued all of his medications.   His wife tells me that after his medications were complete, he just  discontinued all of them.   PROCEDURES:  1.  Left heart catheterization  2.  Selective coronary arteriography.  3.  PTCA and stenting of the circumflex coronary artery.   DESCRIPTION OF PROCEDURE:  The patient was brought to the catheterization  laboratory, prepped and draped in the usual fashion. Through an anterior  puncture, the right femoral artery was easily entered. A 6-French sheath was  placed. We then attempted to do a ventriculography; however, there appeared  to be possible clotting in the power injector, so we removed the pigtail  measuring pressures only. Views of the left and right coronary arteries were  then obtained. The patient had continued patency of the RCA stents but a  progressive lesion in the midcircumflex. Based on this, we felt that  percutaneous coronary intervention was indicated. The patient has had  progressive symptoms, progressing to the point where his wife thought he was  going to have a heart attack this past week. I discussed the case with the  patient and also subsequently with his wife. They were agreeable  to proceed  with percutaneous intervention. Following this, a JL-3.5 guiding catheter  was utilized. The patient had been enrolled in the [place] trial for  antiplatelet therapy. Also, the patient was given bivalirudin. ACT was  checked and found to be appropriate. A Prowater wire was put down the  artery. We then passed the nondrug-eluting stent, but there was no flow  beyond the stent. As a result, I removed the stent. We then placed a 2.20 x  15 Maverick balloon and dilatations were done up to about 5-6 atmospheres  for predilatation. The lesion was then crossed with an 18 x 2.5 mini Vision  stent. We elected to place a nondrug-eluting stent largely because the  patient has not been fairly compliant with medications, and he has not had  restenosis from the previously placed nondrug-eluting stents. With this the  stent was taken up to about 10 atmospheres. Because of its size, we did not  go much higher than this. We then postdilated using a Quantum Maverick  balloon inside the stent. These were taken to 14 atmospheres for  postdilatation with an excellent angiographic appearance. There was no  evidence of edge tear. All catheters were subsequently  removed.   Femoral sheath was sewn into place and he was taken to the holding area in  satisfactory clinical condition.   HEMODYNAMIC DATA:  1.  Central aortic pressure 95/58.  2.  Left ventricular pressure 107/18.  3.  No gradient on pullback across the aortic valve.   ANGIOGRAPHIC DATA:  1.  The right coronary artery demonstrates a previously placed stent in the      proximal to midvessel. Compared to the previous studies, the vessel      remains widely patent with about 30% diffuse narrowing throughout the      stent site; however, it appears patent. There is a distally placed stent      before the PDA that has 30-40% narrowing at most. There appears to be      good flow through this. The PDA and the posterolateral system are widely       patent.  2.  Left main is free of critical disease.  3.  The LAD courses to the apex. There is a fair amount of diffuse luminal      irregularity throughout the LAD but no areas of high-grade critical      stenoses.  4.  The circumflex demonstrates about 30% narrowing. There is a moderate-      sized marginal branch that has diffuse segmental plaquing of about 50%      in the midportion. Just beyond this, the AV circumflex demonstrates a      progressive 90% stenosis compared to the previous study. I have given      the wife a picture of the previous study and the current study.      Following stenting with the 18 x 2.5 mini Vision, the stenosis was      reduced to 0%. There was some diffuse luminal irregularity distally but      good flow in to the distal vessel.   Ventriculography was ultimately not performed to reduce contrast load. A 2-D  echocardiogram will be obtained in the morning.   CONCLUSIONS:  1.  Continued patency of the previously placed right coronary artery stents.  2.  High-grade stenosis of the midcircumflex with progression of disease      previously with classic history of exertional angina and successful      percutaneous stenting with a nondrug-eluting platform.   DISCUSSION:  The patient has not had restenosis from his previously placed  non-DES stents. Not too long after the stent placement, the patient  discontinued all of his medicines, and would not allow his wife to call the  physician. In addition, even when he developed pain over the past six months  or so he has encouraged her not to call. Ultimately, he was agreeable and he  now has presented with high-grade exertional symptoms. With the high-grade  lesion in the circumflex, we went ahead and stented this vessel and we used  a nondrug-eluting stent given the lack of restenosis in the previously  placed stent, and also the history of questionable compliance with medications. The patient will be  treated with medications appropriately.      Arturo Morton. Riley Kill, M.D. [facility]  Electronically Signed     TDS/MEDQ  D:  07/19/2005  T:  07/20/2005  Job:  [id number]   cc:   CV Laboratory   Patient's medical record

## 2010-11-06 NOTE — Assessment & Plan Note (Signed)
[facility]                            CARDIOLOGY OFFICE NOTE   JOAOPEDRO, ESCHBACH                       MRN:          [id number]  DATE:05/25/2006                            DOB:          1961-04-20    Mr. Cervone is in for a followup.  He is a Education administrator.  He has had some  discomfort for the past 2 weeks.  He initially said he did not have any,  but when his wife encouraged him to speak up, he had had some  discomfort.  The discomfort is not necessarily related to exertion.  He  can get out and about without any difficulty whatsoever.  Of note, the  patient underwent exercise tolerance testing nearly 4 months after his  second percutaneous intervention, and he had not had inducible ischemia  at that time.   MEDICATIONS:  1. Simvastatin 80 mg daily.  2. Enteric-coated aspirin 325 mg daily.  3. Plavix 75 mg daily.  4. Metoprolol 12.5 b.i.d.   PHYSICAL EXAMINATION:  VITAL SIGNS:  The blood pressure is 138/80, pulse  78.  LUNGS:  Lung Capron are clear.  CHEST:  There is no tenderness over the left chest.  HEART:  Cardiac rhythm is regular.   ELECTROCARDIOGRAM:  Normal sinus rhythm with no acute changes.   We talked about various options and strategies today.  The patient has  had 2-vessel percutaneous intervention with non-drug-eluding platforms.  He appears to be stable at the present time.  However, he has had  episodes of chest discomfort for the past 2 weeks, but they have been  decidedly atypical.  We talked about the option of a cardiac  catheterization, but he is agreeable to coming in within the next 2 days  for a repeat exercise tolerance test, and we will reassess his status.  Should he have any problems in the interim, they are to call 911.  He  has nitroglycerin to take at home should he have further symptoms.     Arturo Morton. Riley Kill, MD, [facility]  Electronically Signed    TDS/MedQ  DD: 05/25/2006  DT: 05/26/2006  Job #: [id number]

## 2010-11-06 NOTE — Assessment & Plan Note (Signed)
[facility]                            CARDIOLOGY OFFICE NOTE   SABER, [name]                       MRN:          [id number]  DATE:07/19/2006                            DOB:          1960/10/03    July 19, 2006   [name] is in for follow-up.  He is stable.  He has not been having  any chest pain.  He feels well. He goes to the gym on a daily basis.  He  has not missed the gym at all.   PHYSICAL EXAMINATION:  VITAL SIGNS:  Today the blood pressure is 130/82,  pulse 75, weight 192 pounds.  LUNGS:  Lung Jake are clear.  CARDIOVASCULAR:  Regular rhythm.  EXTREMITIES:  No edema.   IMPRESSION:  1. Coronary artery disease status post stenting of the circumflex and      right coronary arteries with nondrug eluting platforms.  2. Negative exercise tolerance test May 27, 2006.  3. Hyperlipidemia.   PLAN:  1. Decrease aspirin to 81 mg daily.  2. Discontinue Plavix.  3. Lipid and liver profile.     [name]. Riley Kill, MD, [facility]  Electronically Signed    TDS/MedQ  DD: 07/19/2006  DT: 07/19/2006  Job #: [id number]

## 2010-11-06 NOTE — Cardiovascular Report (Signed)
[place]. [facility]  Patient:    [name], HEURING Visit Number: [id number] MRN: [id number]          Service Type: MED Location: CCUA 2929 01 Attending Physician:  Ronaldo Miyamoto Dictated by:   Arturo Morton Riley Kill, M.D. [facility] Proc. Date: 05/19/01 Admit Date:  05/17/2001   CC:         CV Laboratory  Nathen May, M.D. [facility]   Cardiac Catheterization  INDICATIONS: The patient is a 50 year old male, who presents with chest pain. He had elevated enzymes. He had mild ST segment abnormalities which subsequently resolved. He also had an episode of nonsustained ventricular arrhythmia following bradycardia, possibly thought to be due to reperfusion. There was no significant evidence of ST segment elevation. He was brought to the catheterization lab after a period of initial medical stabilization with antiplatelets, IIb/IIIa glycoprotein inhibitors, intravenous heparin, and beta blockade. Risks, benefits and alternatives were discussed with the patient and his family in detail. He consented to proceed.  PROCEDURES: 1. Left heart catheterization. 2. Selective coronary arteriography. 3. Selective left ventriculography. 4. Percutaneous coronary intervention of the right coronary artery.  DESCRIPTION OF PROCEDURE: The procedure was performed from the right femoral artery using 6 French catheters.  He tolerated the procedure without complication. The study demonstrated some mild irregularities in the left coronary system, and two high-grade areas of disease involving the right coronary artery. Because of this, percutaneous intervention was recommended. I discussed the options with the patient and then subsequently his wife. Both were agreeable to proceed.  The 6 French sheath was then exchanged for a 7 [place] sheath.  ACT was checked and appropriate heparinization was given. A  7 [place] guiding catheter was utilized. A 0.014 Hi-Torque Floppy  wire was placed in the distal vessel. We pre-dilated the proximal lesion with a 2.5 mm balloon. Following this, the distal lesion was stented with a 12 mm length, 2.5 mm Express stent.  This was taken up to 14 atmospheres yielding a vessel of about 2.7. This was markedly improved.  The proximal lesion was then stented with a 24 mm stent x 2.5. There was marked improvement in the appearance of this artery, but the stent was just short proximally and a second 8 mm x 2.5 mm stent was overlapped in this area. We then post-dilated this entire area using a 3 mm Quantum Maverick balloon which was taken up to about 13-14 atmospheres with marked improvement in the appearance of the artery. All catheters were subsequently removed and the femoral sheath sewn into place and the patient was taken to the holding area in satisfactory clinical condition.  HEMODYNAMIC DATA: 1. Central aortic pressure 127/89, mean 107. 2. Left ventricular 116/15/23. 3. No gradient on pullback across the aortic valve.  ANGIOGRAPHIC DATA:  LEFT VENTRICULOGRAPHY: Left ventriculography was performed in the RAO projection. Ejection fraction was calculated at 45%. There was inferobasal hypo but not akinesis.  The left main coronary artery was free of critical disease.  The left anterior descending artery coursed to the apex where it bifurcated. There were two major diagonal branches and two major septal perforators. Both of these appeared to be free of significant disease.  There was a very small ramus intermedius vessel.  There was a 20% area of focal narrowing in the proximal circumflex prior to the first small marginal branch. The second marginal branch was moderate in size. There was segmental plaquing of about 50% distally.  The right coronary artery  demonstrates an 80% focal stenosis near the proximal and mid junction followed by a segmental 95% overlapping small RV branch. Distally, there was a 75% stenosis  prior to the PDA and posterolateral system. Following stenting the proximal stenoses were reduced to 0% and the distal stenosis reduced to 0%. There was an excellent angiographic appearance with TIMI-3 flow.  CONCLUSIONS: 1. Mild reduction in global left ventricular function with an inferobasal    wall motion abnormality with hypo but not akinesis. 2. High-grade tandem stenoses of the right coronary artery with successful    percutaneous stenting. 3. Mild abnormalities of the left coronary system as described above.  DISPOSITION: The patient will be treated with aspirin and Plavix. He will be continued on low-dose beta blockade and statin therapy will be added to his regimen. Long-term followup with cardiology will be recommended. Dictated by:   Arturo Morton Riley Kill, M.D. [facility] Attending Physician:  Ronaldo Miyamoto DD:  05/19/01 TD:  05/19/01 Job: [id number]

## 2010-11-06 NOTE — Discharge Summary (Signed)
NAMEYUEPHENG, SCHALLER NO.:  [id number]   MEDICAL RECORD NO.:  [id number]          PATIENT TYPE:  INP   LOCATION:  6522                         FACILITY:  [facility]   PHYSICIAN:  Learta Codding, M.D. LHCDATE OF BIRTH:  1961-02-15   DATE OF ADMISSION:  07/16/2005  DATE OF DISCHARGE:  07/20/2005                           DISCHARGE SUMMARY - REFERRING   ADDENDUM:  This is the additional supplement discharge addendum.  This is  supplemental to discharge summary [phone] and addendum [phone].   An echocardiogram will be performed at Dr. Rosalyn Charters office on July 26, 2005 at 2 p.m. to determine LV function in that during his catheterization  on July 19, 2005, LV-gram was not performed.      Joellyn Rued, P.A. [facility]      Learta Codding, M.D. [facility]  Electronically Signed    EW/MEDQ  D:  07/20/2005  T:  07/20/2005  Job:  [id number]

## 2010-11-06 NOTE — Discharge Summary (Signed)
NAMEBRAIN, HONEYCUTT NO.:  [id number]   MEDICAL RECORD NO.:  [id number]          PATIENT TYPE:  INP   LOCATION:  6522                         FACILITY:  [facility]   PHYSICIAN:  Joellyn Rued, P.A. [facility] DATE OF BIRTH:  Dec 21, 1960   DATE OF ADMISSION:  07/16/2005  DATE OF DISCHARGE:  07/20/2005                           DISCHARGE SUMMARY - REFERRING   ADDENDUM:  Additional discharge information for [name].  He was  given a supplemental discharge instruction sheet in regards to cardiac  catheterization post activity and wound care instructions.  He will follow  up with Dr. Riley Kill in the [facility] office on August 13, 2005 at 4:15  p.m.  He was advised no smoking or tobacco products.   MEDICATIONS:  1.  Aspirin 325 mg daily.  2.  Plavix 75 mg daily.  3.  Metoprolol 12.5 mg b.i.d.  4.  Simvastatin 40 mg q.h.s.  5.  Foltx 1 mg p.o. daily.  6.  Nitroglycerin p.r.n.   Prior to discharge, he received educational material in regards to smoking  cessation and hyperlipidemia.  In approximately six to eight weeks, he will  need fasting lipids and LFTs since a statin was initiated during this  admission.      Joellyn Rued, P.A. [facility]     EW/MEDQ  D:  07/20/2005  T:  07/20/2005  Job:  [id number]   cc:   Arturo Morton. Riley Kill, M.D. [facility]  [address]  [address]  [place]  [place] [address]

## 2010-11-06 NOTE — H&P (Signed)
[name], [name] NO.:  [id number]   MEDICAL RECORD NO.:  [id number]          PATIENT TYPE:  EMS   LOCATION:  MAJO                         FACILITY:  [facility]   PHYSICIAN:  Arturo Morton. Riley Kill, M.D. [facility] OF BIRTH:  07/16/1960   DATE OF ADMISSION:  07/16/2005  DATE OF DISCHARGE:                                HISTORY & PHYSICAL   PRIMARY CARE PHYSICIAN:  No primary care physician at this time.   Mr. [name] is a 50 year old Caucasian gentleman with a known history of  coronary artery disease, status post non-ST elevated MI with a PCI in 2002,  last visit with Dr. Riley Kill was in May 2003, at which time the patient had  an exercise stress test.  The patient has not had any followup with  cardiology or a primary care physician since that time.  He presents today  at the insistence of his wife with chest discomfort.  The patient's wife  states he had done quite well since 2002, when he had two stents to the  right coronary artery.  In fact, the patient's wife states he did so well  that the patient discontinued all of his medications himself secondary to  feeling so well.  And, the patient himself states he has been feeling quite  well until last year when he began having some mild dyspnea on exertion.  However, the last two months he has had intermittent episodes of chest  discomfort, described as substernal chest pain in the form of tightness.  However, the last two weeks the chest discomfort has become more pronounced,  more frequent, and more intense, to the point that the patient has not been  able to work a full day at times and he is a Education administrator up on a scaffold.  He  has had more shortness of breath also.  He has complained of dizziness,  generalized weakness, blurred vision.  The patient's wife states he is  diaphoretic with any exertion.  He is somewhat pale at times and nauseated.  Sunday, he had an episode of chest discomfort that was so intense he rated  it at a 10 on a scale of 1 to 10.  The patient was laying in the bed very  pale and diaphoretic, did not respond to wife for approximately five  minutes.  He states the pain was so intense he could not move.  He is  noncompliant with his diet, medications.  He continues to smoke two packs a  day.  He has decreased energy, increased fatigue, currently rating his pain  around a 5.  Nitroglycerin has been initiated.  The patient has received 4  mg of morphine.  While I am standing with the patient, he has had an episode  of paleness with severe diaphoresis after administration of morphine.  Nitroglycerin drip has been stopped.  The patient has been placed flat on a  stretcher.  A fluid bolus has been given.  His blood pressure dropped to  102/56.  After fluid bolus, blood pressure returned to 116/75.  The patient  more comfortable, currently denying any chest  pain.   ALLERGIES:  No known drug allergies.   MEDICATIONS:  None.   The patient was previously in 2002 at discharge on:  1.  Altace 2.5 mg daily.  2.  Zocor 20 mg daily.  3.  Aspirin 325 mg.  4.  Toprol XL 50 mg daily.  5.  Wellbutrin 150 mg p.o. b.i.d.  6.  Plavix 75 mg daily.  7.  Foltx one tablet daily.   PAST MEDICAL HISTORY:  1.  Coronary artery disease, status post non-ST elevated MI.      1.  Cath in 2002 with two Express stents to the right coronary artery.          Patient also with a 20% area of focal narrowing in the proximal          circumflex prior to the first small marginal branch.  The second          marginal branch was moderate in size.  There was a segmental          plaquing of about 50% distally.  Patient with an ejection fraction          calculated at 45% at time of cardiac catheterization.  There was          inferior basal hypo but not akinesis also.      2.  Last stress testing was done in May 2003.  The patient had an          exercise treadmill with no significant ST depression, negative for           ongoing ischemia, EF calculated at 45%, with inferior basal          hypokinesis at that time.      3.  The patient also had an episode of nonsustained V tach following          bradycardia at time of cardiac catheterization possibly thought to          be secondary to reperfusion.  2.  Questionable history of asthma.  3.  History of hyperlipidemia.  4.  History of ongoing tobacco abuse.   SOCIAL HISTORY:  The patient lives in [place] with his wife.  He is a  Education administrator.  He has two adult children.  Tobacco use:  Two packs a day, has  done so for many years.  No regular exercise.  Denies any ETOH, drug, or  herbal medication use.  He is on a regular diet.   FAMILY HISTORY:  Mother alive.  She has a history of bypass and also has a  history of Alzheimer disease.  Father deceased secondary to MVA at a younger  age.  Siblings:  He has a sister who has already had bypass.  She has kidney  disease and peripheral vascular disease.  He has two brothers who have also  had bypass and have already had redo.   REVIEW OF SYSTEMS:  Positive for sweats, positive for headaches, positive  for blurred vision.  CARDIOPULMONARY:  Positive for chest pain, shortness of  breath, dyspnea on exertion, palpitations, presyncope, chronic cough,  questionable claudication.  NEURO/PSYCH:  Positive for anxiety.  GI:  Positive for nausea, dysphagia, and GERD symptoms.  ENDO:  Positive for heat  intolerance.  All other systems negative per patient.   PHYSICAL EXAMINATION:  VITAL SIGNS:  Temp is 97, pulse is 89, respirations  are 18, blood pressure 143/91, blood pressure currently 116/75.  The patient  is sating  97% on room air, currently on 4 liters of oxygen sating 99%.  GENERAL:  He is in no acute distress.  He has a very flat affect.  He avoids  eye contact.  HEENT:  Pupils are equal, round, and reactive to light.  His sclerae is very injected.  Dentition:  The patient has a partial upper plate.  NECK:   Supple without lymphadenopathy.  Negative bruit or JVD.  CARDIOVASCULAR:  Heart rate regular rhythm.  S1 and S2.  Pulses are 2+ and  equal without bruits.  LUNGS:  Clear to auscultation.  He has poor inspiratory effort.  ABDOMEN:  Soft, nontender.  Positive bowel sounds.  EXTREMITIES:  Without clubbing, cyanosis, or edema.  He has 2+ DPs  bilateral.  NEUROLOGIC:  He is alert and oriented x3.  Cranial nerves II-XII grossly  intact.   Chest x-ray:  Results are pending.  EKG at a rate of 90, sinus rhythm.  He  has some mild ST depression inferior leads, otherwise no acute change.   LABORATORY DATA:  We got a WBC of 11.4, hemoglobin 16.4, hematocrit 47.8  with a platelet count of 258,000.  PT of 14.1, INR of 1.1.  Point-of-care  cardiac enzymes:  CK-MB 1.2, myo 72.6, with a troponin of less than 0.05.  No other lab work available at this time.   Dr. Lewayne Bunting in to examine and assess the patient with known coronary  artery disease, complaining of chest discomfort x2 week severe headaches,  blurred vision.   1.  The patient will be admitted for cardiac catheterization.  We will      proceed with cath on Monday.  2.  The patient will be continued on heparin which has already been started,      however, heparin is currently on hold.  3.  We will proceed with a CT scan of the head secondary to blurred vision      and ongoing headaches.  4.  A CT of the chest to rule out dissection.  If CT of the chest is      negative for dissection, heparin will be resumed at current rate per      pharmacy dosing.  5.  Nitroglycerin drip has been discontinued secondary to episode of      hypotension, questionable vagal response.  Nitroglycerin will be ordered      sublingual p.r.n. for chest discomfort.  6.  Lopressor will be initiated at a low dose 12.5 mg p.o. b.i.d.  It will      be held for systolic blood pressure less than 100 or heart rate less      than 65.  7.  The patient will also be placed  on a Statin, Lipitor 20 mg p.o. q.h.s.,      Foltx one tablet daily, aspirin 325 mg p.o. daily, Protonix 40 mg daily.  8.  In the setting of ongoing use of tobacco two packs per day, the patient      will be treated with Ativan 1 mg p.o.  __________  .  We will also get a      drug and ETOH screening.  Ativan can also serve dual purpose if needed.  9.  Morphine will be discontinued secondary to episode of hypotension and      decreased heart rate in the 60s, after administration.  We will use      Dilaudid if needed for chest discomfort.  10. At this time point-of-care enzymes are  negative.  11. We will place the patient on telemetry unit.  Will need to be upgraded      if any changes in status or CT scan is positive.      Dorian Pod, NP      Arturo Morton. Riley Kill, M.D. [facility]  Electronically Signed    MB/MEDQ  D:  07/16/2005  T:  07/16/2005  Job:  [id number]

## 2010-11-06 NOTE — Discharge Summary (Signed)
. [facility]  Patient:    AVYUKTH, BONTEMPO Visit Number: [id number] MRN: [id number]          Service Type: MED Location: 3700 3730 01 Attending Physician:  Ronaldo Miyamoto Dictated by:   [name], P.A. Admit Date:  05/17/2001 Discharge Date: 05/21/2001                             Discharge Summary  DATE OF BIRTH: 07-18-1960  DISCHARGE DIAGNOSES:  1. Status post nonST-segment elevation myocardial infarction.     a. Status post stenting x 2 to the right coronary artery.     b. Residual coronary artery disease in the proximal circumflex with 20%        narrowing in second marginal branch with segmental plaque of about        50% distally.     c. Left ventriculogram revealing ejection fraction of 45% with        inferobasilar hypokinesis.  2. Nonsustained ventricular tachycardia this admission related to     reperfusion.  3. Tobacco abuse.  4. Family history of coronary artery disease.  5. Questionable history of asthma.  6. Hyperlipidemia.  PROCEDURES THIS ADMISSION: Cardiac catheterization and percutaneous coronary intervention by Dr. Shawnie Pons on May 19, 2001.  HISTORY OF PRESENT ILLNESS: This 50 year old male, with no previous past medical history except for questionable asthma, presented to the emergency room on May 17, 2001 with complaint of chest pain.  He developed anterior chest pain like someone hit him in the chest that radiated to his left arm, with associated with shortness of breath and diaphoresis.  This occurred while driving.  He returned home and his wife made him come to the emergency room. He denied any prior occurrence of exertional chest pain, shortness of breath or limitations, orthopnea, PND, edema, palpitations, syncope, GI or GU problems.  In the emergency room he was treated with sublingual nitroglycerin, aspirin, and IV heparin with some improvement.  His chest x-ray showed  no active disease.  His first EKG revealed normal sinus rhythm, ST depression in 1 and aVL, and T wave inversion in 1, aVL, aVF, and lateral T wave flattening. His second EKG showed normal sinus rhythm with some improvement.  PHYSICAL EXAMINATION:  VITAL SIGNS:  On initial examination his blood pressure was 130/78, pulse 79, respirations 12.  NECK: Without JVD or bruits.  LUNGS: Clear to auscultation.  HEART: Regular rate and rhythm.  ABDOMEN: Soft without bruits.  EXTREMITIES: Without edema.  HOSPITAL COURSE: The patient was admitted and placed on IV heparin, aspirin, and 2b-3a inhibitor, as well as low-dose beta-blocker.  The patient experienced an episode of hypotension that evening as well as bradycardia, with heart rate in the 30s.  He was treated with Atropine and transferred to the CCU.  His nitroglycerin drip had been turned off.  ECG revealed sinus rhythm, heart rate 74, inferior Qs, but improved ST segments.  His nitroglycerin was left off.  He was continued on heparin and Integrelin.  The patient later had runs of nonsustained V tach.  He was placed on a lidocaine drip and his beta-blocker was increased.  On the morning of May 18, 2001 he was found to be in stable condition, without any further chest discomfort or left arm pain.  His VT had resolved with lidocaine.  His arrhythmia was reviewed by Dr. Riley Kill and it was felt that this  was secondary to reperfusion.  The patients enzymes were noted to be elevated.  His initial set of enzymes were normal but his second set his CK was 491, CK-MB 62.2, and troponin I 2.6.  His cardiac enzymes peaked out at a total CK of 978, CK-MB 121.8, and troponin I 11.87.  He was taken to the cardiac catheterization laboratory on May 19, 2001 by Dr. Shawnie Pons.  The results are noted above.  The patient also underwent percutaneous coronary intervention.  He tolerated the procedure well and had no immediate complications.   On the morning of May 20, 2001 he was in stable condition, without any shortness of breath or chest pain or groin bleeding.  His groin was stable.  His EKG was noted to exhibit inferior Q waves.  Tobacco cessation was discussed with the patient.  His beta-blocker was increased and ACE inhibitor, Foltx and statin were all added.  On the morning of May 21, 2001 he was found to be in stable condition without any further chest pain, and was ready to go home. His groin is still stable without hematoma or bruit, but with some slight ecchymosis.  It was decided he was stable enough to discharge home.  He would continue treatment with aspirin, Plavix, statin, beta-blocker, ACE inhibitor, Foltx, and nitroglycerin p.r.n.  He would remain on Wellbutrin for tobacco cessation.  FOLLOW-UP: He is to follow up with Dr. Riley Kill on June 02, 2001 in [place], [place].  He will need to be established with a primary care physician.  He will see Dr. De Nurse assistant on June 02, 2001 at 12 noon at our office in [place], [place].  LABORATORY DATA: Prior to discharge WBC 11,200, hemoglobin 14.2, hematocrit 41.7, platelet count 203,000.  INR on admission was 1.0.  Sodium 138, potassium 3.4, chloride 105, CO2 26, glucose 113, BUN 8, creatinine 1.0.  Last set of cardiac enzymes checked showed CK 258, CK-MB 7.3.  Lipid profile - total cholesterol 135, triglyceride 241, HDL 30, LDL 157, VLDL 48.  TSH 0.825.  DISCHARGE MEDICATIONS:  1. Aspirin 325 mg q.d.  2. Wellbutrin SR 150 mg b.i.d.  3. Plavix 75 mg q.d.  4. Toprol XL 50 mg q.d.  5. Altace 2.5 mg q.d.  6. Foltx q.d.  7. Zocor 20 mg q.h.s.  8. Nitroglycerin p.r.n. chest pain.  DISCHARGE ACTIVITY: No heavy exertion or working until seen by the doctor on June 02, 2001.  DISCHARGE DIET: Low-fat/low-sodium.  WOUND CARE: The patient is to call or return for any groin swelling, bleeding, or  bruising. Dictated by:   [name], P.A. Attending Physician:  Ronaldo Miyamoto DD:  05/21/01  TD:  05/21/01 Job: [id number]

## 2010-11-06 NOTE — Assessment & Plan Note (Signed)
[facility]                            CARDIOLOGY OFFICE NOTE   LAVERNE, KLUGH                       MRN:          [id number]  DATE:09/08/2006                            DOB:          1960/08/27    Mr. [name] is in for a followup visit.  From a cardiac standpoint, he  has been stable.  He has not been having any ongoing chest pain.  He  underwent cardiac catheterization.  Catheterization procedure  demonstrated continued patency of his stents.  He has scattered  plaque.  His laboratory studies during his hospitalization did reveal borderline  sugars as well as a hemoglobin A1c of 6.1.  His cardiac markers were  normal.  His cholesterol was in the mid 200s with triglycerides of 400s.  The HDL was 23.  All of these fit in with the pattern that appears to be  that of adult dysmetabolic syndrome.  He has had no recurrent chest  pain.   CURRENT MEDICATIONS:  1. Enteric-coated aspirin 81 mg daily.  2. Crestor 20 mg daily.  3. Metoprolol 12.5 mg p.o. b.i.d.   PHYSICAL EXAMINATION:  GENERAL:  He is alert and oriented.  VITAL SIGNS:  Blood pressure 130/90, pulse 60.  LUNGS:  The lung Kasler are clear.  CARDIAC:  Rhythm is regular.   Recent chest x-ray reveals cardiomegaly without evidence of acute  cardiopulmonary disease, but it was an AP film.   Other laboratory studies are as noted.   IMPRESSION:  1. Coronary artery disease.  2. Mixed hyperlipidemia associated with what appears to be adult      dysmetabolic syndrome.  3. Recent cardiac catheterization which fits with prior negative      exercise tolerance test in December 2007.   PLAN:  1. Return to clinic in 6 months.  2. I recommended significant weight loss, and we would reassess his      lipid profile in the interval period of time.     Arturo Morton. Riley Kill, MD, [facility]  Electronically Signed    TDS/MedQ  DD: 09/08/2006  DT: 09/08/2006  Job #: [id number]

## 2010-11-06 NOTE — Discharge Summary (Signed)
NAMETRASHAUN, STREIGHT NO.:  [id number]   MEDICAL RECORD NO.:  [id number]          PATIENT TYPE:  INP   LOCATION:  3711                         FACILITY:  [facility]   PHYSICIAN:  Arturo Morton. Riley Kill, MD, FACCDATE OF BIRTH:  April 15, 1961   DATE OF ADMISSION:  08/06/2006  DATE OF DISCHARGE:  08/09/2006                               DISCHARGE SUMMARY   NOTE:  The patient was discharged home by Dr. Riley Kill.  I am  transcribing discharge summary for Dr. Riley Kill.   PRIMARY CARE PHYSICIAN:  Arturo Morton. Riley Kill, MD, [facility]   PRIMARY CARE:  The patient states he is followed by the Urgent Care in  [place].   DISCHARGING DIAGNOSES:  1. Chest pain status post cardiac catheterization showing well-      preserved left ventricular function continued patency of the stents      involving two locations in the right coronary artery and distal      circumflex.  Chest pain of uncertain etiology.  Negative cardiac      workup.  2. Hypertension.  3. Hyperlipidemia.   PAST MEDICAL HISTORY:  1. Coronary artery disease status post stenting x2 in January of 2007.      The patient also had a right coronary stent in 2002.  2. Dyslipidemia.  3. Noncompliance.  4. Similar history of previous tobacco use.  5. Hypertension.  6. History of nonsustained ventricular tachycardia.   HOSPITAL COURSE:  Mr. Speir is a 50 year old gentleman with a known  history of coronary artery disease, status post previous stents.  Other  medical history as stated above.  The patient states on the morning of  admission he awoke with substernal chest discomfort similar to chest  discomfort prior to his stents.  He presented to the emergency room  where he was treated with heparin and morphine with improvement of his  symptoms EKG showed normal sinus rhythm without acute ST or T-wave  changes.  The patient stated compliance with his medications.  The  patient was admitted to the hospital and ruled out for an MI.   Treated  with aspirin, nitroglycerin, Plavix, Lovenox and morphine.  The patient  continued to have some chest discomfort concerning for unstable angina.  Taken to the cath lab on August 08, 2006 by Dr. Riley Kill showing normal  ejection fraction was continued patency of the right coronary artery and  circumflex stents.  D-dimer within normal limits.  Dr. Riley Kill  apparently saw the patient post cath the following day.  Lab work was  stable with a BUN of 12, creatinine 0.85, hemoglobin 14.4.  Patient  afebrile, blood pressure 121/79.  The patient discharged home by Dr.  Riley Kill.   DISCHARGE INSTRUCTIONS:  1. May return to work on August 15, 2006.  2. Follow up with Dr. Riley Kill.   MEDICATION:  1. Metoprolol as at home.  2. Aspirin 325 daily.   DURATION OF TRANSCRIBING:  Ten minutes.  Unknown discharge encounter  time by Dr. Riley Kill.   Unable to send to a primary care physician, as it is not listed which  urgent care the patient  frequents.      Dorian Pod, ACNP      Arturo Morton. Riley Kill, MD, [facility]  Electronically Signed    MB/MEDQ  D:  09/10/2006  T:  09/10/2006  Job:  [id number]

## 2010-11-06 NOTE — Discharge Summary (Signed)
NAMEARNELL, SLIVINSKI NO.:  [id number]   MEDICAL RECORD NO.:  [id number]          PATIENT TYPE:  INP   LOCATION:  6522                         FACILITY:  [facility]   PHYSICIAN:  Arturo Morton. Riley Kill, M.D. Romualdo Bolk OF BIRTH:  10-19-1960   DATE OF ADMISSION:  07/16/2005  DATE OF DISCHARGE:  07/20/2005                                 DISCHARGE SUMMARY   CARDIOLOGIST:  Maisie Fus D. Riley Kill, M.D.   DISCHARGING DIAGNOSIS:  Coronary artery disease, status post stent to the  circumflex lesion.   PROCEDURES PERFORMED DURING THIS HOSPITALIZATION:  1.  2-D echo, July 20, 2005.  2.  Left heart catheterization, SCA, percutaneous transluminal coronary      angioplasty/stent to circumflex on July 19, 2005.   SECONDARY DIAGNOSES:  1.  History of coronary artery disease, status post percutaneous coronary      intervention and stent to the right coronary artery x2 in 2002.  2.  Nonsustained ventricular tachycardia.  3.  History of dyslipidemia.  4.  History of noncompliance.   HISTORY OF PRESENT ILLNESS:  The patient is a 50 year old Caucasian male who  was admitted on July 16, 2005, for chest pain.  The patient had a  previous history of a stent in 2002 to his RCA.  The patient had  discontinued his medications at home and in the last few months had started  to have intermittent chest pain.   The patient was admitted on July 16, 2005, with unstable angina.  A  cardiac catheterization was performed on July 19, 2005, which showed  patent RCA stent with progressive circumflex lesion.  Dr. Riley Kill performed  a successful PCI with a drug-eluting stent on July 19, 2005.  A 2-D echo  was also ordered today, which is pending.   DISCHARGING MEDICATIONS:  1.  Aspirin 325 mg p.o. daily.  2.  Plavix 75 mg p.o. daily.  3.  Metoprolol 12.5 mg b.i.d.  4.  Simvastatin 40 mg q.h.s.  5.  Foltx tablet p.o. daily.   Discharging hemoglobin is 13.5, hematocrit is 35.8, platelet  count at 146.  Sodium is 138, potassium is 3.4, BUN is less than 1, creatinine is 0.7,  glucose is 137.  EKG shows normal sinus rhythm with a questionable left  atrial enlargement, with a rate of 71.   The patient was instructed to eat a healthy heart diet with a low-  cholesterol, low-fat diet, to stop smoking, to follow up with Dr. Riley Kill in  two weeks, and to continue current medications.  He was also instructed to  not drive for two days and no lifting for one week.  An instruction sheet  was given to the patient for follow-up.     ______________________________  April Humphrey, NP      Arturo Morton. Riley Kill, M.D. [facility]  Electronically Signed    AH/MEDQ  D:  07/20/2005  T:  07/20/2005  Job:  [id number]

## 2010-11-06 NOTE — Cardiovascular Report (Signed)
[name], [name] NO.:  [id number]   MEDICAL RECORD NO.:  [id number]          PATIENT TYPE:  INP   LOCATION:  3711                         FACILITY:  [facility]   PHYSICIAN:  Arturo Morton. Riley Kill, MD, FACCDATE OF BIRTH:  05-Oct-1960   DATE OF PROCEDURE:  08/08/2006  DATE OF DISCHARGE:                            CARDIAC CATHETERIZATION   INDICATIONS:  Mr. [name] is a 50 year old gentleman well-known to [place].  He underwent stenting in two locations in the right coronary artery in  2002 for an acute coronary syndrome.  He had a repeat intervention 1  year earlier involving the distal circumflex.  He has had a recent  stress test without evidence of exercise-induced chest discomfort.  Nonetheless, he presented with a recurrent episodes of chest pain.  The  current study was done to assess his coronary anatomy.  Enzymes were  negative.  There were no definite electrocardiographic abnormalities.   PROCEDURE:  1. Left heart catheterization.  2. Selective coronary arteriography.  3. Selective left ventriculography.   DESCRIPTION OF THE PROCEDURE:  The procedure was performed from the  right femoral artery using 5-French catheters.  He tolerated the  procedure without complication and was taken to the holding area in  satisfactory clinical condition.   HEMODYNAMIC DATA:  1. Central aortic pressure was 121/81, mean 99.  2. Left ventricular pressure 107/49.  3. No gradient or pullback across the aortic valve.   ANGIOGRAPHIC DATA:  1. Left ventriculography was done in the RAO projection.  Overall      systolic function is improved.  When compared to the study of 2002,      regional wall motion involving the inferior wall was significantly      better.  Overall systolic function appears to be preserved with an      ejection fraction estimate in the 55-60 range.  2. The left main is free of critical disease.  3. The LAD courses to the apex.  There two major diagonal  branches.      There is minor luminal irregularity in the LAD, but no significant      areas of high-grade focal stenosis.  The distal vessel does wrap      the apical tip.  4. The circumflex provides an insignificant first marginal.  The      second marginal is a segmentally plaqued vessel with about 40% mid-      narrowing.  It is somewhat smaller in caliber.  The AV circumflex      also has some diffuse luminal irregularities, but the stent site      demonstrates no more than about 30% area of narrowing in the mid      vessel.  5. The right coronary artery is a dominant vessel.  It has been      previously stented both in the mid and distal locations.  There is      a long stent in the proximal to mid portion of the mid artery.      There is perhaps diffuse 30-40% healing along this stent site.  The  distally placed stent is widely patent in its distal-most aspect,      but has about 30% narrowing in the proximal most aspect.  In either      case, it does not appear to be flow-limiting.  The posterior      descending and posterolateral branches are without critical      stenosis.   CONCLUSIONS:  1. Well-preserved left ventricular function.  2. Continued patency of the stents involving the 2 locations in the      right coronary artery and distal circumflex.  3. Chest pain of uncertain etiology.   DISPOSITION:  We will review the patient's clinical findings and look  for other potential sources of chest pain.  I have reviewed the films  with his wife in detail.      Arturo Morton. Riley Kill, MD, [facility]  Electronically Signed     TDS/MEDQ  D:  08/08/2006  T:  08/09/2006  Job:  [id number]

## 2010-11-26 ENCOUNTER — Telehealth: Payer: Self-pay | Admitting: Cardiology

## 2010-11-26 NOTE — Telephone Encounter (Signed)
Per wife - pt has taken Chantix before and it helped him stop smoking.  He has since restarted and want to try Chantix again.  Wife aware Dr Riley Kill not in the office today but will be tomorrow.  When we obtain order from MD - will call her back and let her know.  She states understanding.

## 2010-11-26 NOTE — Telephone Encounter (Signed)
Need a Rx for chantix - to help stop smoking.

## 2010-11-30 ENCOUNTER — Other Ambulatory Visit: Payer: Self-pay | Admitting: Cardiology

## 2010-11-30 ENCOUNTER — Other Ambulatory Visit (INDEPENDENT_AMBULATORY_CARE_PROVIDER_SITE_OTHER): Payer: PRIVATE HEALTH INSURANCE | Admitting: *Deleted

## 2010-11-30 DIAGNOSIS — E7800 Pure hypercholesterolemia, unspecified: Secondary | ICD-10-CM

## 2010-11-30 DIAGNOSIS — I2510 Atherosclerotic heart disease of native coronary artery without angina pectoris: Secondary | ICD-10-CM

## 2010-11-30 LAB — LDL CHOLESTEROL, DIRECT: Direct LDL: 79.7 mg/dL

## 2010-11-30 LAB — LIPID PANEL
Cholesterol: 157 mg/dL (ref 0–200)
HDL: 31.6 mg/dL — ABNORMAL LOW (ref >39.00)
Total CHOL/HDL Ratio: 5
Triglycerides: 326 mg/dL — ABNORMAL HIGH (ref 0.0–149.0)
VLDL: 65.2 mg/dL — ABNORMAL HIGH (ref 0.0–40.0)

## 2010-11-30 LAB — HEPATIC FUNCTION PANEL: Albumin: 4.8 g/dL (ref 3.5–5.2)

## 2010-11-30 MED ORDER — VARENICLINE TARTRATE 0.5 MG X 11 & 1 MG X 42 PO MISC: ORAL | Status: DC

## 2010-11-30 NOTE — Telephone Encounter (Signed)
I reviewed EMR record and this pt has been given Chantix in the past.  The pt had quit smoking for over a year after using Chantix.  I will send in a new Rx for Chantix starting month pack.  The pt will call the office when he completes this pack and then the continuing month pack can be prescribed (will need for 2 months).

## 2010-11-30 NOTE — Telephone Encounter (Signed)
Pt calling re rx for chantrix, was told dr Riley Kill would be asked on Friday and call her back, hasn't heard yet, pls call with answer ok to leave message

## 2010-12-01 NOTE — Telephone Encounter (Signed)
I spoke with the pt's wife and made her aware that Dr Riley Kill does not want the pt to start Chantix at this time.  I also reviewed the pt's cholesterol results with the pt's wife.  At this time the pt is not going to start fish oil because the pt's wife said that in the past Dr Riley Kill had instructed them not to take fish oil in the past because it interfered with the pt's medications. I scheduled the pt to come into the office on 12/16/10 to discuss smoking and cholesterol results with Dr Riley Kill.

## 2010-12-01 NOTE — Telephone Encounter (Signed)
I spoke with Dr Riley Kill by phone and at this time he would like to hold off on having the pt start Chantix.  Yesterday the pt's prescription was printed not sent into the pharmacy.  I will void this prescription.

## 2010-12-13 ENCOUNTER — Other Ambulatory Visit: Payer: Self-pay | Admitting: Pulmonary Disease

## 2010-12-13 DIAGNOSIS — G4733 Obstructive sleep apnea (adult) (pediatric): Secondary | ICD-10-CM

## 2010-12-16 ENCOUNTER — Ambulatory Visit (INDEPENDENT_AMBULATORY_CARE_PROVIDER_SITE_OTHER): Payer: PRIVATE HEALTH INSURANCE | Admitting: Cardiology

## 2010-12-16 ENCOUNTER — Encounter: Payer: Self-pay | Admitting: Cardiology

## 2010-12-16 DIAGNOSIS — E785 Hyperlipidemia, unspecified: Secondary | ICD-10-CM

## 2010-12-16 DIAGNOSIS — F17200 Nicotine dependence, unspecified, uncomplicated: Secondary | ICD-10-CM

## 2010-12-16 DIAGNOSIS — G4730 Sleep apnea, unspecified: Secondary | ICD-10-CM

## 2010-12-16 DIAGNOSIS — E7800 Pure hypercholesterolemia, unspecified: Secondary | ICD-10-CM

## 2010-12-16 DIAGNOSIS — Z720 Tobacco use: Secondary | ICD-10-CM

## 2010-12-16 DIAGNOSIS — I10 Essential (primary) hypertension: Secondary | ICD-10-CM

## 2010-12-16 DIAGNOSIS — I2510 Atherosclerotic heart disease of native coronary artery without angina pectoris: Secondary | ICD-10-CM

## 2010-12-16 LAB — LIPID PANEL
HDL: 32 mg/dL — ABNORMAL LOW (ref >39.00)
Triglycerides: 216 mg/dL — ABNORMAL HIGH (ref 0.0–149.0)

## 2010-12-16 LAB — LDL CHOLESTEROL, DIRECT: Direct LDL: 60.1 mg/dL

## 2010-12-16 LAB — HEPATIC FUNCTION PANEL
ALT: 32 U/L (ref 0–53)
AST: 20 U/L (ref 0–37)
Alkaline Phosphatase: 55 U/L (ref 39–117)
Total Bilirubin: 0.7 mg/dL (ref 0.3–1.2)

## 2010-12-16 NOTE — Patient Instructions (Signed)
Your physician recommends that you have lab work today: lipid/liver  Your physician recommends that you continue on your current medications as directed. Please refer to the Current Medication list given to you today.  Your physician wants you to follow-up in: 6 months. You will receive a reminder letter in the mail two months in advance. If you don't receive a letter, please call our office to schedule the follow-up appointment.

## 2010-12-23 DIAGNOSIS — Z720 Tobacco use: Secondary | ICD-10-CM | POA: Insufficient documentation

## 2010-12-23 NOTE — Assessment & Plan Note (Signed)
Reviewed in detail with patient and wife.  Exceed 3 minutes in counseling time.  Not a Chantix candidate. FDA data given.  Discussed methods, and he understands although has not been successful with a variety of things in the past.

## 2010-12-23 NOTE — Progress Notes (Signed)
HPI:  Continues to smoke and is in today to discuss this.  Worked with Chantix in the past, but I reveiwed with him the FDA warning, and he and his wife agreed this was not the solution.  We did review what has worked in the past.  Denies current chest pain, and overall is doing pretty well.  Last cath data is in [facility] for perusal of the user.   He did break out in a rash recently, was placed on Prednisone on a short course, and it is improved.  Went to urgent care.   Current Outpatient Prescriptions  Medication Sig Dispense Refill  . aspirin 325 MG tablet Take 325 mg by mouth daily.        . clopidogrel (PLAVIX) 75 MG tablet Take 75 mg by mouth daily.        . fish oil-omega-3 fatty acids 1000 MG capsule Take 2 g by mouth daily.        . metoprolol tartrate (LOPRESSOR) 25 MG tablet Take 25 mg by mouth 2 (two) times daily.        . Mometasone Furo-Formoterol Fum (DULERA) 200-5 MCG/ACT AERO Inhale 2 puffs into the lungs 2 (two) times daily.        . nitroGLYCERIN (NITROSTAT) 0.4 MG SL tablet Place 0.4 mg under the tongue every 5 (five) minutes as needed.        . rosuvastatin (CRESTOR) 40 MG tablet Take 1 tablet (40 mg total) by mouth daily.  30 tablet  11  . DISCONTD: clopidogrel (PLAVIX) 75 MG tablet Take 75 mg by mouth daily.         Allergies  Allergen Reactions  . Heparin     Low bp    Past Medical History  Diagnosis Date  . Coronary artery disease     s/p BMS to RCA and CFX. s/p DES to RCA 2/2 ISR 2010  . Hypertension   . Hyperlipidemia   . Old myocardial infarction   . Paroxysmal ventricular tachycardia   . Asthma     Past Surgical History  Procedure Date  . Coronary stent placement 2002, 2007, 2010    right    Family History  Problem Relation Age of Onset  . Heart disease Mother     an siblings    History   Social History  . Marital Status: Married    Spouse Name: N/A    Number of Children: N/A  . Years of Education: N/A   Occupational History  . painter      Social History Main Topics  . Smoking status: Current Everyday Smoker -- 1.5 packs/day for 30 years    Types: Cigarettes  . Smokeless tobacco: Never Used  . Alcohol Use: No  . Drug Use: No  . Sexually Active: Not on file   Other Topics Concern  . Not on file   Social History Narrative  . No narrative on file    ROS: Please see the HPI.  All other systems reviewed and negative.  PHYSICAL EXAM:  BP 127/89  Pulse 80  Ht 5\' 8"  (1.727 m)  Wt 194 lb (87.998 kg)  BMI 29.50 kg/m2  General: Well developed, well nourished, in no acute distress. Head:  Normocephalic and atraumatic. Neck: no JVD Lungs: Clear to auscultation and percussion.  Prolonged expiration Heart: Normal S1 and S2.  No murmur, rubs or gallops.  Abdomen:  Normal bowel sounds; soft; non tender; no organomegaly Pulses: Pulses normal in all 4 extremities.  Extremities: No clubbing or cyanosis. No edema. Neurologic: Alert and oriented x 3.  EKG: NSR. WNL.  CXR:  06/16/2010:  CHEST - 2 VIEW    Comparison: 06/16/2010    Findings: Attenuated peripheral pulmonary vasculature is compatible   with emphysema/COPD.  Mild cardiomegaly noted, without edema.    No pleural effusion identified.    IMPRESSION:    1. Attenuated peripheral pulmonary vasculature is compatible with   emphysema/COPD.   2.  Mild cardiomegaly, stable.    Read By:  Dellia Cloud,  M.D.  ASSESSMENT AND PLAN:

## 2010-12-23 NOTE — Assessment & Plan Note (Signed)
Currently controlled.  Would improve more if he quit smoking.

## 2010-12-23 NOTE — Assessment & Plan Note (Signed)
He remains stable despite his risk factors.

## 2010-12-23 NOTE — Assessment & Plan Note (Addendum)
Lipid data as noted.  Low HDL and higher triglceride----LDL at target.  Remain on statin.  Might be good candidate for CTEP inhibitor when available.  Also needs focus on weight.  Will recheck levels and make appropriate recommendations.

## 2011-01-22 ENCOUNTER — Other Ambulatory Visit: Payer: Self-pay | Admitting: *Deleted

## 2011-01-22 MED ORDER — CLOPIDOGREL BISULFATE 75 MG PO TABS: 75.0000 mg | ORAL_TABLET | Freq: Every day | ORAL | Status: DC

## 2011-02-05 ENCOUNTER — Other Ambulatory Visit: Payer: Self-pay | Admitting: *Deleted

## 2011-02-05 DIAGNOSIS — E7800 Pure hypercholesterolemia, unspecified: Secondary | ICD-10-CM

## 2011-02-05 DIAGNOSIS — I2510 Atherosclerotic heart disease of native coronary artery without angina pectoris: Secondary | ICD-10-CM

## 2011-02-05 MED ORDER — ROSUVASTATIN CALCIUM 40 MG PO TABS: 40.0000 mg | ORAL_TABLET | Freq: Every day | ORAL | Status: DC

## 2011-02-05 NOTE — Telephone Encounter (Signed)
rx sent in today. [name]  

## 2011-02-15 ENCOUNTER — Other Ambulatory Visit: Payer: Self-pay | Admitting: Cardiology

## 2011-04-16 ENCOUNTER — Other Ambulatory Visit: Payer: Self-pay | Admitting: Cardiology

## 2011-04-26 ENCOUNTER — Ambulatory Visit (INDEPENDENT_AMBULATORY_CARE_PROVIDER_SITE_OTHER): Payer: PRIVATE HEALTH INSURANCE | Admitting: Pulmonary Disease

## 2011-04-26 ENCOUNTER — Encounter: Payer: Self-pay | Admitting: Pulmonary Disease

## 2011-04-26 VITALS — BP 140/82 | HR 82 | Temp 97.6°F | Ht 66.0 in | Wt 200.4 lb

## 2011-04-26 DIAGNOSIS — G4733 Obstructive sleep apnea (adult) (pediatric): Secondary | ICD-10-CM

## 2011-04-26 NOTE — Assessment & Plan Note (Signed)
The patient has done extremely well with CPAP, and feels that he is sleeping much better with improved daytime alertness.  His machine recently stopped working, and therefore we will arrange for a replacement.  I have asked him to work aggressively on weight loss, and to followup with me in one year.

## 2011-04-26 NOTE — Patient Instructions (Signed)
Stay on cpap.  Will get you a new machine Work on weight loss followup with me in one year.

## 2011-04-26 NOTE — Progress Notes (Signed)
  Subjective:    Patient ID: [name], male    DOB: [birth year], 50 y.o.   MRN: [id number]  HPI The patient comes in today for followup of his obstructive sleep apnea.  He has been wearing CPAP compliantly, and has had no issues with mask fit or pressure tolerance.  He feels that he is sleeping much better, and his wife notes improved daytime alertness.  His machine recently began to malfunction, and it is only approximately 35 months old.  Will need to get him a replacement.   Review of Systems  Constitutional: Negative for fever and unexpected weight change.  HENT: Negative for ear pain, nosebleeds, congestion, sore throat, rhinorrhea, sneezing, trouble swallowing, dental problem, postnasal drip and sinus pressure.   Eyes: Negative for redness and itching.  Respiratory: Negative for cough, chest tightness, shortness of breath and wheezing.   Cardiovascular: Negative for palpitations and leg swelling.  Gastrointestinal: Negative for nausea and vomiting.  Genitourinary: Negative for dysuria.  Musculoskeletal: Negative for joint swelling.  Skin: Negative for rash.  Neurological: Negative for headaches.  Hematological: Does not bruise/bleed easily.  Psychiatric/Behavioral: Negative for dysphoric mood. The patient is not nervous/anxious.        Objective:   Physical Exam Overweight male in no acute distress no skin breakdown or pressure necrosis from the CPAP mask Lower extremities without edema, no cyanosis noted Alert and oriented, moves all 4 extremities.      Assessment & Plan:

## 2011-10-26 ENCOUNTER — Other Ambulatory Visit: Payer: Self-pay | Admitting: Cardiology

## 2011-11-12 ENCOUNTER — Other Ambulatory Visit: Payer: Self-pay

## 2011-11-12 MED ORDER — CLOPIDOGREL BISULFATE 75 MG PO TABS: 75.0000 mg | ORAL_TABLET | Freq: Every day | ORAL | Status: DC

## 2012-05-02 ENCOUNTER — Telehealth: Payer: Self-pay | Admitting: Pulmonary Disease

## 2012-05-02 NOTE — Telephone Encounter (Signed)
Recall Appointment: °Called patient to schedule follow up apt 3 times and left message. Sent letter 05/02/12.  °

## 2012-06-16 ENCOUNTER — Other Ambulatory Visit: Payer: Self-pay | Admitting: *Deleted

## 2012-06-16 MED ORDER — METOPROLOL TARTRATE 25 MG PO TABS: 25.0000 mg | ORAL_TABLET | Freq: Two times a day (BID) | ORAL | Status: DC

## 2012-06-26 IMAGING — CR DG CHEST 2V
2 series · 2 of 2 positions shown · non-contrast
Comparison: 04/24/2009

CLINICAL DATA: Back pain, cough, shortness of breath.  History
smoking two packs per day.

CHEST - 2 VIEW

[view not recorded (1 of 2)]
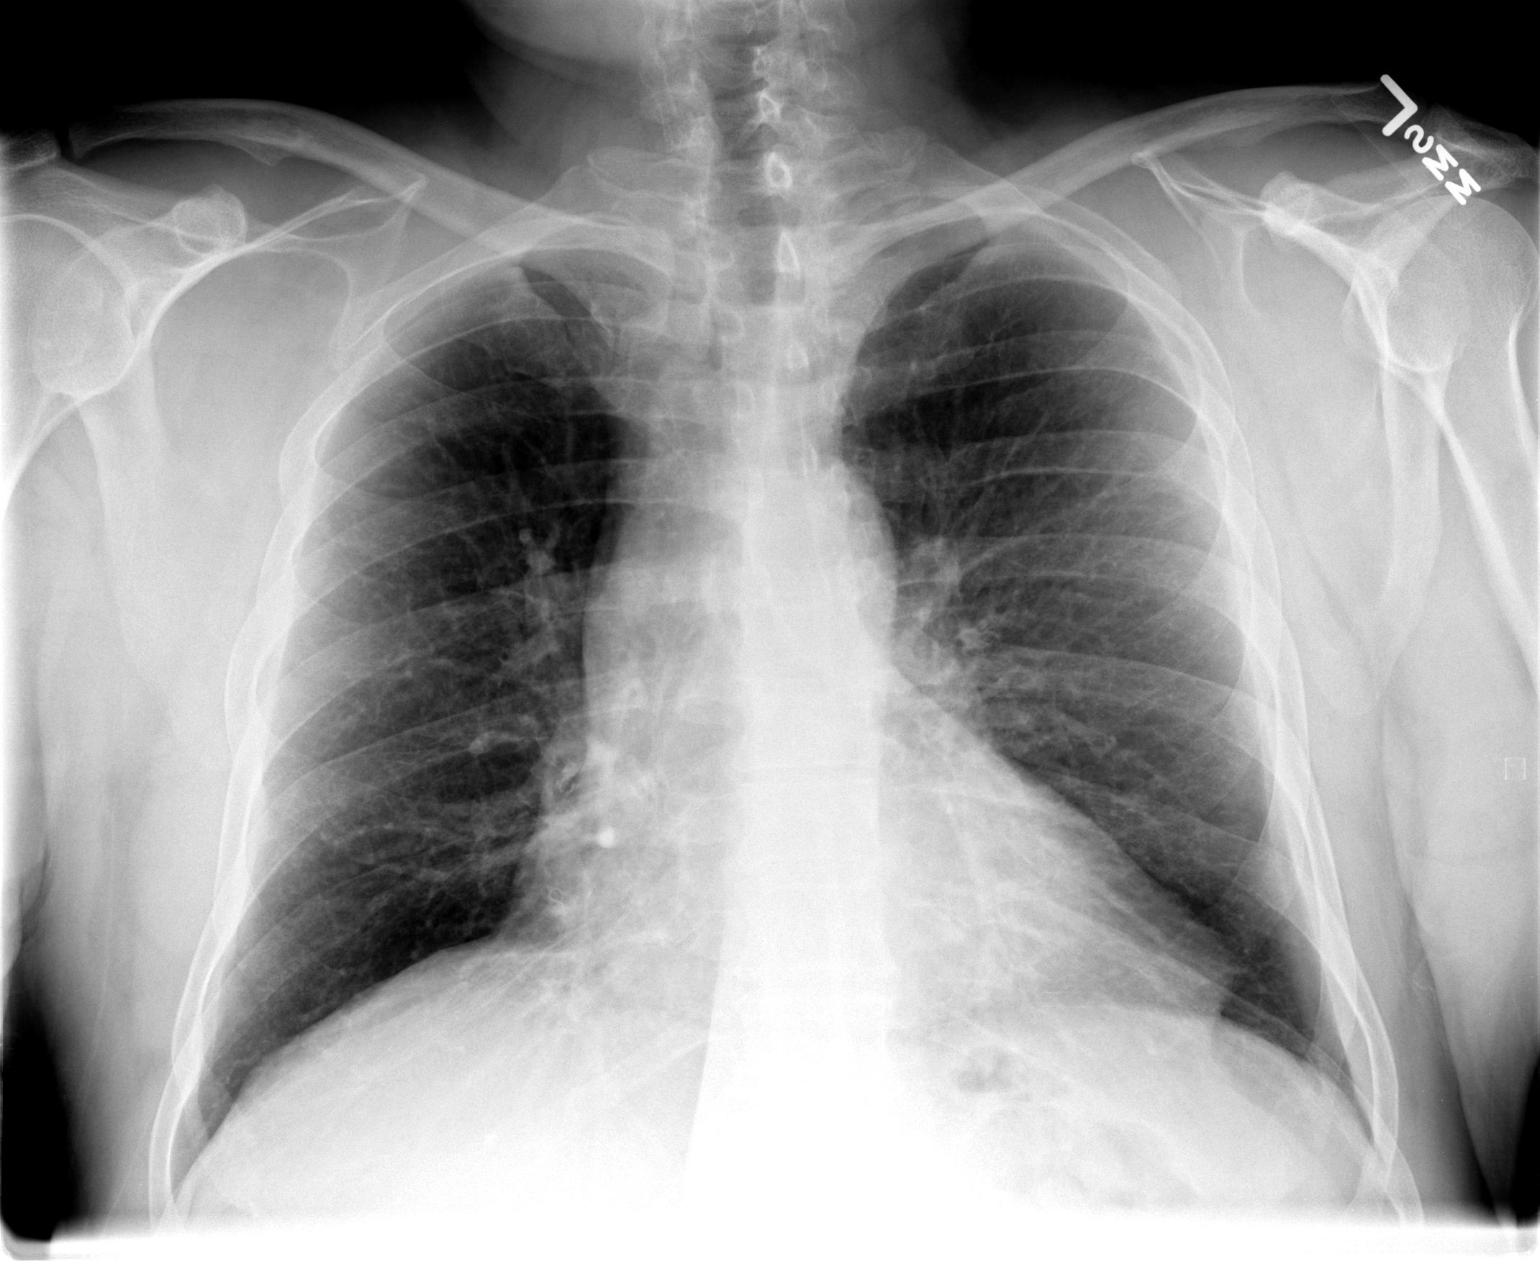

[view not recorded (2 of 2)]
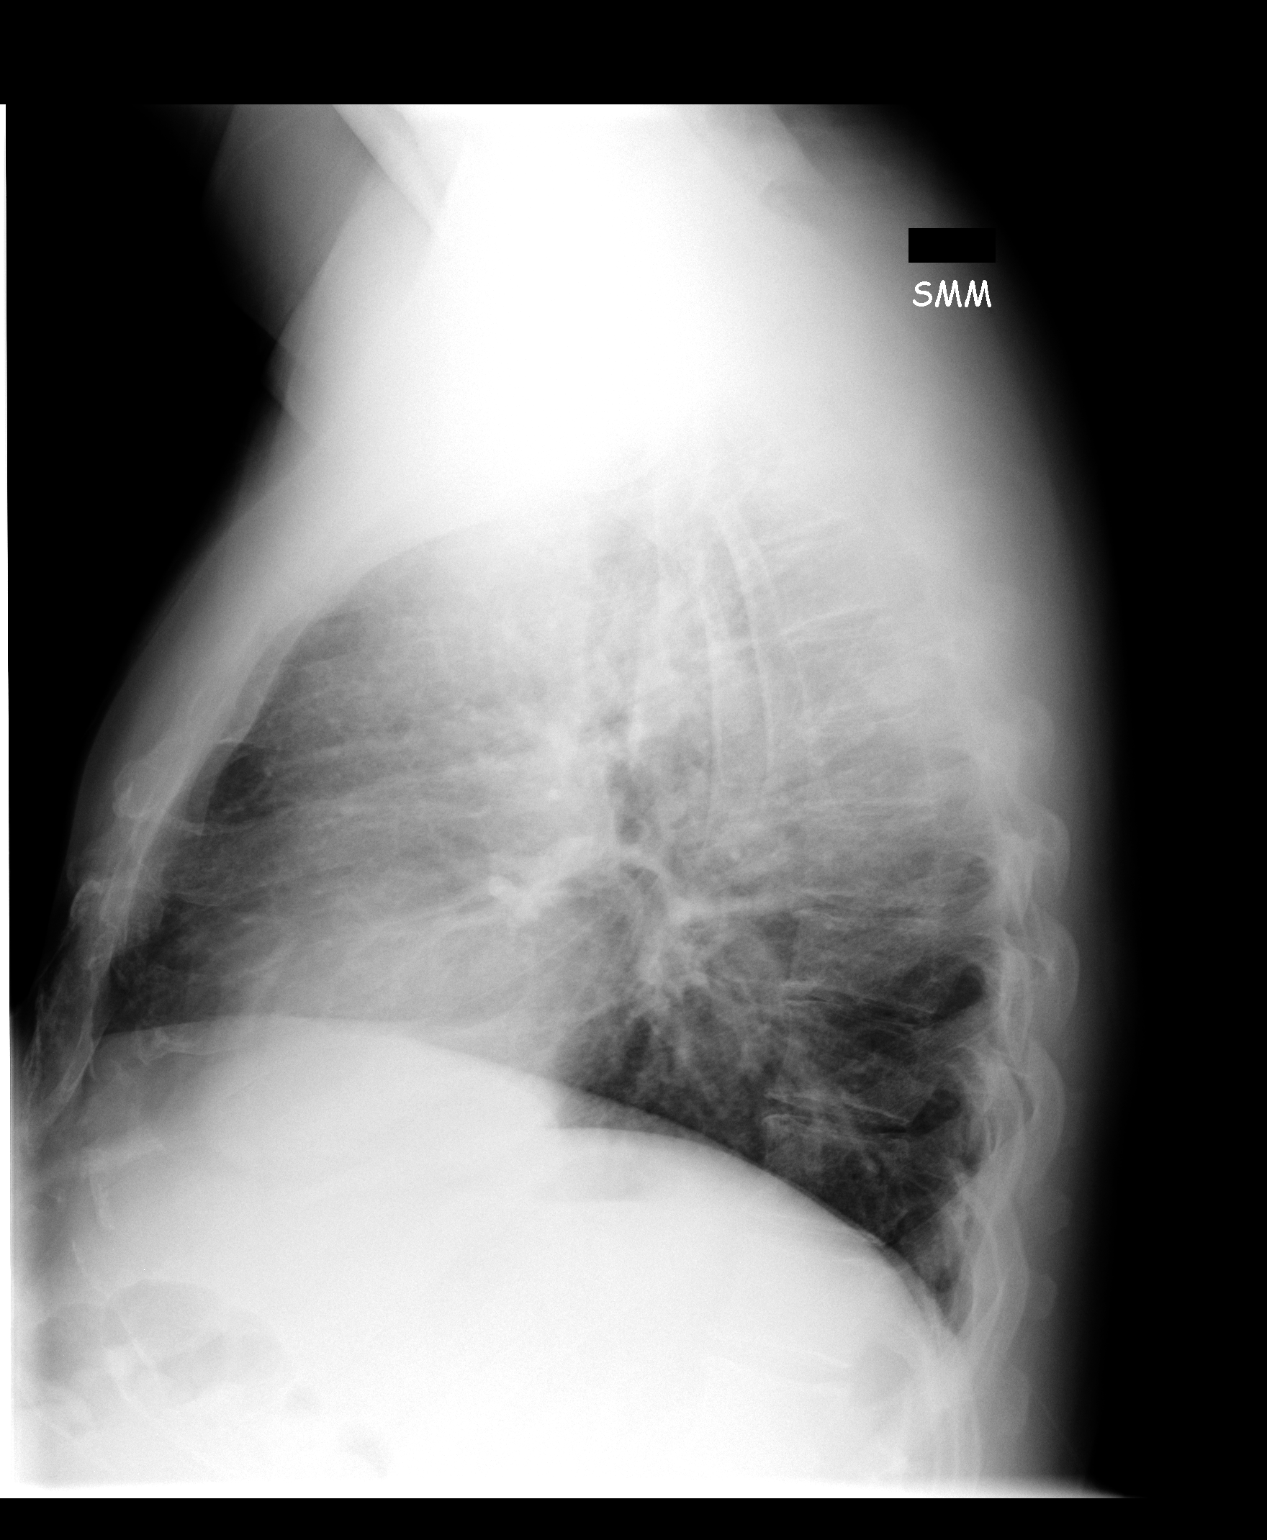

[2 of 2 positions shown; findings below may reference images not displayed]

FINDINGS: Heart size is enlarged.  No pulmonary edema.  There are
significant perihilar bronchitic changes.  No focal consolidations
or pleural effusions.
IMPRESSION: 1.  Cardiomegaly without pulmonary edema.
2.  Bronchitic changes.

## 2012-07-20 IMAGING — CR DG CHEST 2V
2 series · 2 of 2 positions shown · non-contrast
Comparison: 06/16/2010

CLINICAL DATA: Shortness of breath.  Cough.  Hypertension.  Tobacco
use.

CHEST - 2 VIEW

[view not recorded (1 of 2)]
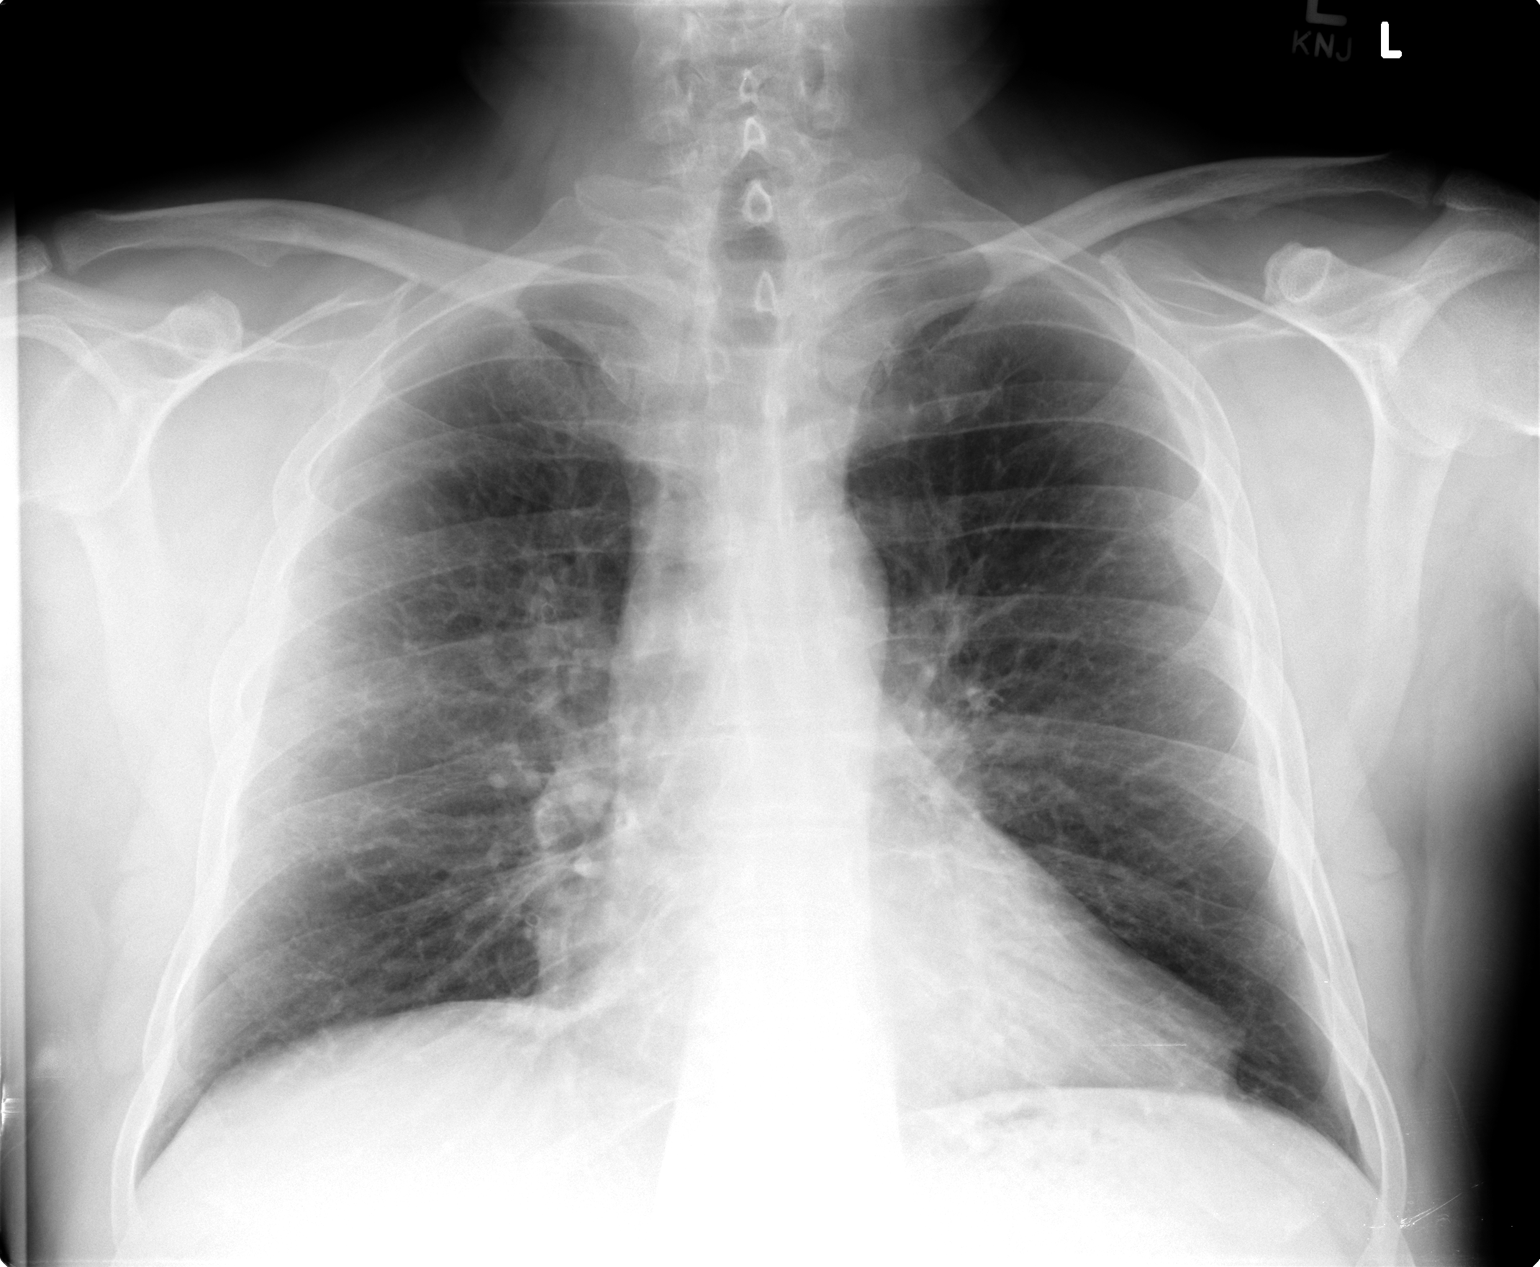

[view not recorded (2 of 2)]
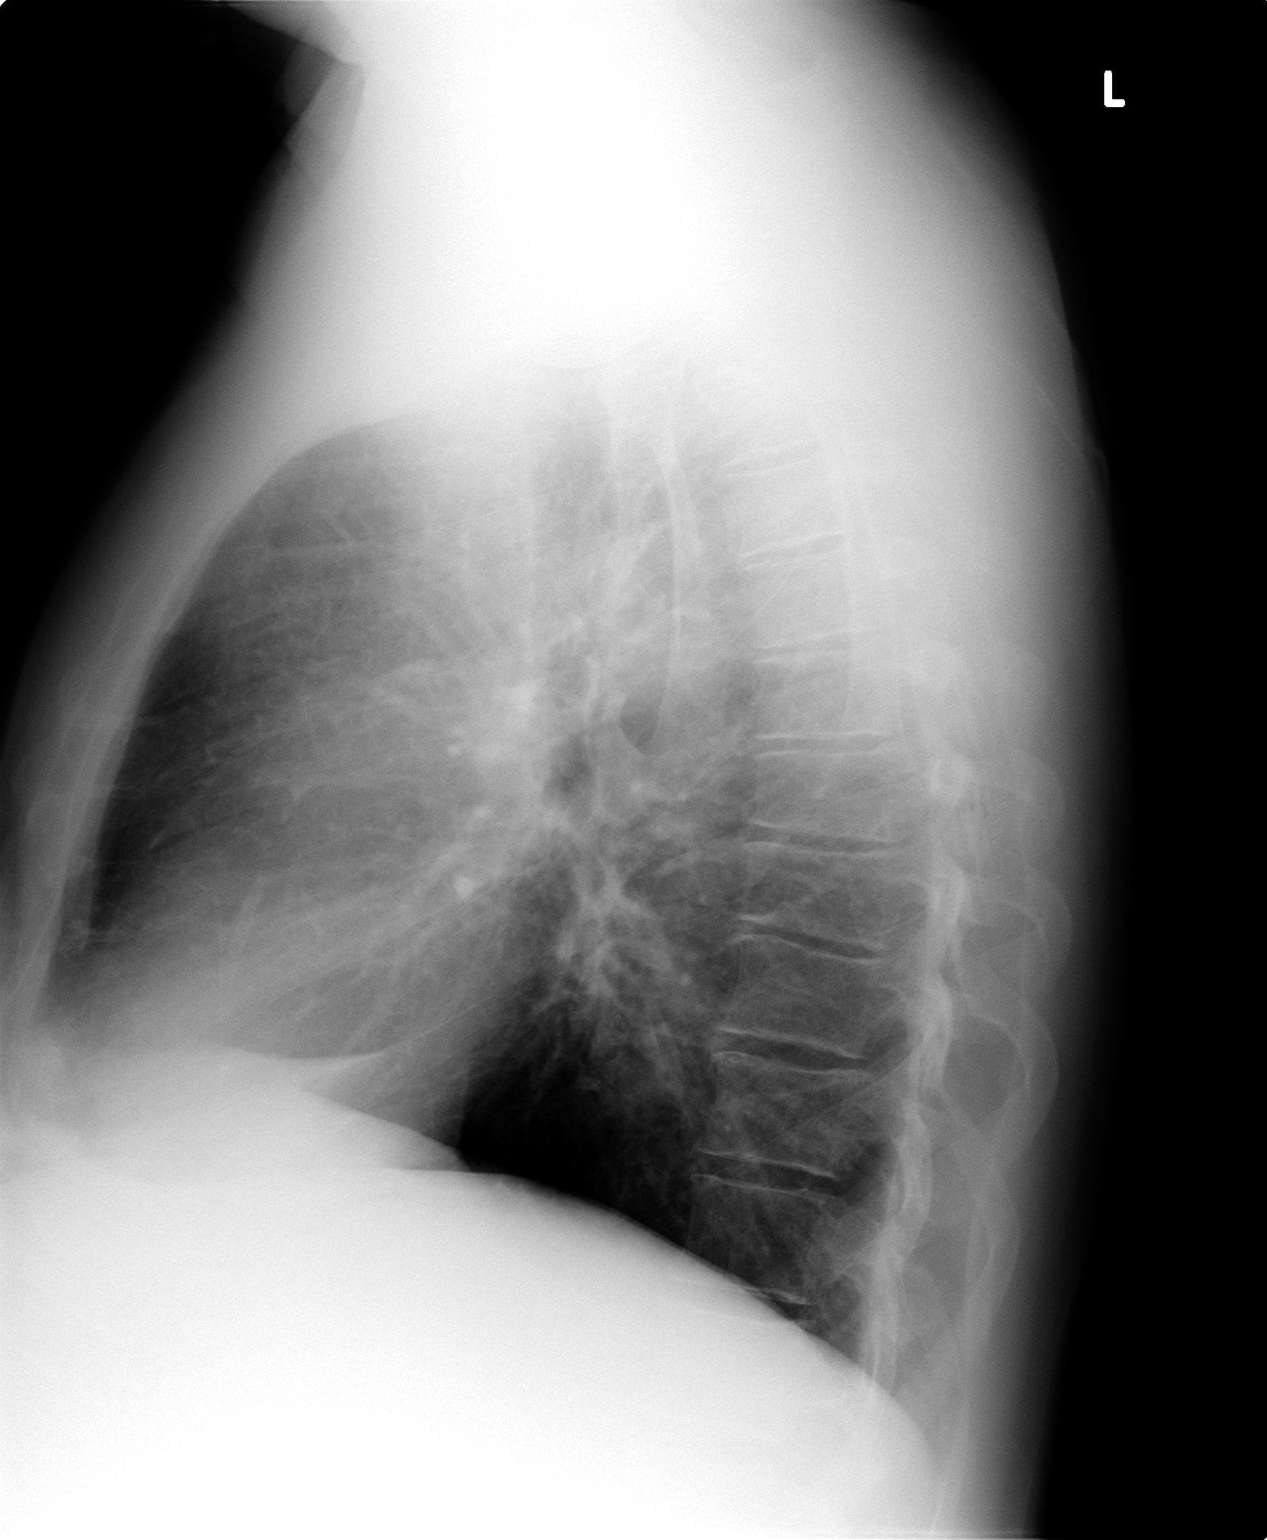

[2 of 2 positions shown; findings below may reference images not displayed]

FINDINGS: Attenuated peripheral pulmonary vasculature is compatible
with emphysema/COPD.  Mild cardiomegaly noted, without edema.

No pleural effusion identified.
IMPRESSION: 1. Attenuated peripheral pulmonary vasculature is compatible with
emphysema/COPD.
2.  Mild cardiomegaly, stable.

## 2012-09-05 ENCOUNTER — Ambulatory Visit (INDEPENDENT_AMBULATORY_CARE_PROVIDER_SITE_OTHER): Payer: PRIVATE HEALTH INSURANCE | Admitting: Internal Medicine

## 2012-09-05 ENCOUNTER — Encounter: Payer: Self-pay | Admitting: Internal Medicine

## 2012-09-05 ENCOUNTER — Other Ambulatory Visit (INDEPENDENT_AMBULATORY_CARE_PROVIDER_SITE_OTHER): Payer: PRIVATE HEALTH INSURANCE

## 2012-09-05 ENCOUNTER — Ambulatory Visit (INDEPENDENT_AMBULATORY_CARE_PROVIDER_SITE_OTHER)
Admission: RE | Admit: 2012-09-05 | Discharge: 2012-09-05 | Disposition: A | Discharge status: Home / Self Care | Payer: PRIVATE HEALTH INSURANCE | Source: Ambulatory Visit | Attending: Internal Medicine | Admitting: Internal Medicine

## 2012-09-05 VITALS — BP 122/82 | HR 93 | Temp 97.7°F | Resp 16 | Wt 208.0 lb

## 2012-09-05 DIAGNOSIS — M545 Low back pain, unspecified: Secondary | ICD-10-CM

## 2012-09-05 DIAGNOSIS — E118 Type 2 diabetes mellitus with unspecified complications: Secondary | ICD-10-CM | POA: Insufficient documentation

## 2012-09-05 DIAGNOSIS — I2510 Atherosclerotic heart disease of native coronary artery without angina pectoris: Secondary | ICD-10-CM

## 2012-09-05 DIAGNOSIS — I10 Essential (primary) hypertension: Secondary | ICD-10-CM

## 2012-09-05 DIAGNOSIS — E781 Pure hyperglyceridemia: Secondary | ICD-10-CM | POA: Insufficient documentation

## 2012-09-05 DIAGNOSIS — G4733 Obstructive sleep apnea (adult) (pediatric): Secondary | ICD-10-CM

## 2012-09-05 DIAGNOSIS — IMO0001 Reserved for inherently not codable concepts without codable children: Secondary | ICD-10-CM

## 2012-09-05 DIAGNOSIS — F17200 Nicotine dependence, unspecified, uncomplicated: Secondary | ICD-10-CM

## 2012-09-05 DIAGNOSIS — E785 Hyperlipidemia, unspecified: Secondary | ICD-10-CM

## 2012-09-05 DIAGNOSIS — Z23 Encounter for immunization: Secondary | ICD-10-CM

## 2012-09-05 DIAGNOSIS — E559 Vitamin D deficiency, unspecified: Secondary | ICD-10-CM

## 2012-09-05 LAB — HM DIABETES FOOT EXAM

## 2012-09-05 LAB — COMPREHENSIVE METABOLIC PANEL (CC13)
A1c: 7.1
Alkaline Phosphatase: 60 U/L
BUN/Creatinine Ratio: 19
CRP, High Sensitivity: 2.8
Calcium: 10.1 mg/dL
Chloride: 103 mmol/L
EGFR: 103 mg/dL
Glucose: 173
INSULIN: 29.8
Potassium: 4.4 mmol/L
Sodium: 138 mmol/L (ref 137–147)
Total bilirubin, fluid: 0.6
Vit D, 25-Hydroxy: 22

## 2012-09-05 LAB — LIPID PANEL
LDL (calc): 107
LDL DIRECT: 129
LP(a) Cholesterol: 5
Remnant Lipo (IDL+VLDL3): 44
Total VLDL-C: 58
Triglycerides: 402
VLDL: 27 mg/dL

## 2012-09-05 LAB — URINALYSIS, ROUTINE W REFLEX MICROSCOPIC
Ketones, ur: NEGATIVE
Leukocytes, UA: NEGATIVE
Specific Gravity, Urine: <=1.005 (ref 1.000–1.030)
Urine Glucose: NEGATIVE
Urobilinogen, UA: 0.2 (ref 0.0–1.0)
WBC, UA: NONE SEEN (ref 0–?)
pH: 6 (ref 5.0–8.0)

## 2012-09-05 LAB — BASIC METABOLIC PANEL
BUN: 14 mg/dL (ref 6–23)
Creatinine, Ser: 0.7 mg/dL (ref 0.4–1.5)
GFR: 123.16 mL/min (ref >60.00)
Glucose, Bld: 146 mg/dL — ABNORMAL HIGH (ref 70–99)

## 2012-09-05 LAB — HEMOGLOBIN A1C: Hgb A1c MFr Bld: 7.4 % — ABNORMAL HIGH (ref 4.6–6.5)

## 2012-09-05 MED ORDER — OXYCODONE-ACETAMINOPHEN 5-325 MG PO TABS: 1.0000 | ORAL_TABLET | Freq: Four times a day (QID) | ORAL | Status: DC | PRN

## 2012-09-05 MED ORDER — ROSUVASTATIN CALCIUM 40 MG PO TABS: 40.0000 mg | ORAL_TABLET | Freq: Every day | ORAL | Status: DC

## 2012-09-05 MED ORDER — OMEGA-3-ACID ETHYL ESTERS 1 G PO CAPS: 2.0000 g | ORAL_CAPSULE | Freq: Two times a day (BID) | ORAL | Status: DC

## 2012-09-05 MED ORDER — CHOLECALCIFEROL 1.25 MG (50000 UT) PO CAPS: 50000.0000 [IU] | ORAL_CAPSULE | ORAL | Status: DC

## 2012-09-05 MED ORDER — CYCLOBENZAPRINE HCL ER 30 MG PO CP24: 30.0000 mg | ORAL_CAPSULE | Freq: Every day | ORAL | Status: DC | PRN

## 2012-09-05 NOTE — Assessment & Plan Note (Signed)
Start Vit D 50,000 u once a week

## 2012-09-05 NOTE — Assessment & Plan Note (Signed)
His BP is well controlled today 

## 2012-09-05 NOTE — Assessment & Plan Note (Signed)
He needs an updated visit with sleep medicine

## 2012-09-05 NOTE — Assessment & Plan Note (Signed)
He is getting relief with percocet and amrix that was started by another MD - he will continue using that for pain I will check a plain film of his low back today

## 2012-09-05 NOTE — Patient Instructions (Signed)
Back Pain, Adult Low back pain is very common. About 1 in 5 people have back pain.The cause of low back pain is rarely dangerous. The pain often gets better over time.About half of people with a sudden onset of back pain feel better in just 2 weeks. About 8 in 10 people feel better by 6 weeks.  CAUSES Some common causes of back pain include:  Strain of the muscles or ligaments supporting the spine.  Wear and tear (degeneration) of the spinal discs.  Arthritis.  Direct injury to the back. DIAGNOSIS Most of the time, the direct cause of low back pain is not known.However, back pain can be treated effectively even when the exact cause of the pain is unknown.Answering your caregiver's questions about your overall health and symptoms is one of the most accurate ways to make sure the cause of your pain is not dangerous. If your caregiver needs more information, he or she may order lab work or imaging tests (X-rays or MRIs).However, even if imaging tests show changes in your back, this usually does not require surgery. HOME CARE INSTRUCTIONS For many people, back pain returns.Since low back pain is rarely dangerous, it is often a condition that people can learn to manageon their own.   Remain active. It is stressful on the back to sit or stand in one place. Do not sit, drive, or stand in one place for more than 30 minutes at a time. Take short walks on level surfaces as soon as pain allows.Try to increase the length of time you walk each day.  Do not stay in bed.Resting more than 1 or 2 days can delay your recovery.  Do not avoid exercise or work.Your body is made to move.It is not dangerous to be active, even though your back may hurt.Your back will likely heal faster if you return to being active before your pain is gone.  Pay attention to your body when you bend and lift. Many people have less discomfortwhen lifting if they bend their knees, keep the load close to their bodies,and  avoid twisting. Often, the most comfortable positions are those that put less stress on your recovering back.  Find a comfortable position to sleep. Use a firm mattress and lie on your side with your knees slightly bent. If you lie on your back, put a pillow under your knees.  Only take over-the-counter or prescription medicines as directed by your caregiver. Over-the-counter medicines to reduce pain and inflammation are often the most helpful.Your caregiver may prescribe muscle relaxant drugs.These medicines help dull your pain so you can more quickly return to your normal activities and healthy exercise.  Put ice on the injured area.  Put ice in a plastic bag.  Place a towel between your skin and the bag.  Leave the ice on for 15 to 20 minutes, 3 to 4 times a day for the first 2 to 3 days. After that, ice and heat may be alternated to reduce pain and spasms.  Ask your caregiver about trying back exercises and gentle massage. This may be of some benefit.  Avoid feeling anxious or stressed.Stress increases muscle tension and can worsen back pain.It is important to recognize when you are anxious or stressed and learn ways to manage it.Exercise is a great option. SEEK MEDICAL CARE IF:  You have pain that is not relieved with rest or medicine.  You have pain that does not improve in 1 week.  You have new symptoms.  You are generally   not feeling well. SEEK IMMEDIATE MEDICAL CARE IF:   You have pain that radiates from your back into your legs.  You develop new bowel or bladder control problems.  You have unusual weakness or numbness in your arms or legs.  You develop nausea or vomiting.  You develop abdominal pain.  You feel faint. Document Released: 06/07/2005 Document Revised: 12/07/2011 Document Reviewed: 10/26/2010 Exit[employer] Patient Information 2013 [employer].  

## 2012-09-05 NOTE — Assessment & Plan Note (Signed)
Change otc fish oils to lovaza

## 2012-09-05 NOTE — Assessment & Plan Note (Signed)
This is a new diagnosis I will recheck his a1c today and will advise further

## 2012-09-05 NOTE — Assessment & Plan Note (Signed)
He will restart crestor 

## 2012-09-05 NOTE — Progress Notes (Signed)
Subjective:    Patient ID: [name], male    DOB: 1960-11-18, 52 y.o.   MRN: [id number]  Back Pain This is a recurrent problem. The current episode started 1 to 4 weeks ago. The problem occurs constantly. The problem is unchanged. The pain is present in the lumbar spine. The quality of the pain is described as stabbing and burning. The pain radiates to the right thigh. The pain is at a severity of 6/10. The pain is moderate. The pain is worse during the day. The symptoms are aggravated by bending, position and twisting. Associated symptoms include leg pain. Pertinent negatives include no abdominal pain, bladder incontinence, bowel incontinence, chest pain, dysuria, fever, headaches, numbness, paresis, paresthesias, pelvic pain, perianal numbness, tingling, weakness or weight loss. Risk factors include obesity and lack of exercise. He has tried muscle relaxant and analgesics for the symptoms. The treatment provided significant relief.      Review of Systems  Constitutional: Negative.  Negative for fever, chills, weight loss, diaphoresis, activity change, appetite change, fatigue and unexpected weight change.  HENT: Negative.   Eyes: Negative.   Respiratory: Positive for apnea. Negative for cough, choking, chest tightness, shortness of breath, wheezing and stridor.   Cardiovascular: Negative for chest pain, palpitations and leg swelling.  Gastrointestinal: Negative for nausea, vomiting, abdominal pain, diarrhea, constipation and bowel incontinence.  Endocrine: Negative.  Negative for cold intolerance, heat intolerance, polydipsia, polyphagia and polyuria.  Genitourinary: Negative.  Negative for bladder incontinence, dysuria and pelvic pain.  Musculoskeletal: Positive for back pain. Negative for myalgias, joint swelling, arthralgias and gait problem.  Skin: Negative.   Allergic/Immunologic: Negative.   Neurological: Negative.  Negative for dizziness, tingling, weakness, light-headedness,  numbness, headaches and paresthesias.  Hematological: Negative.  Negative for adenopathy. Does not bruise/bleed easily.  Psychiatric/Behavioral: Negative.        Objective:   Physical Exam  Vitals reviewed. Constitutional: He is oriented to person, place, and time. He appears well-developed and well-nourished.  Non-toxic appearance. He does not have a sickly appearance. He does not appear ill. No distress.  HENT:  Head: Normocephalic and atraumatic.  Mouth/Throat: Oropharynx is clear and moist. No oropharyngeal exudate.  Eyes: Conjunctivae are normal. Right eye exhibits no discharge. Left eye exhibits no discharge. No scleral icterus.  Neck: Normal range of motion. Neck supple. No JVD present. No tracheal deviation present. No thyromegaly present.  Cardiovascular: Normal rate, regular rhythm, normal heart sounds and intact distal pulses.  Exam reveals no gallop and no friction rub.   No murmur heard. Pulmonary/Chest: Effort normal and breath sounds normal. No stridor. No respiratory distress. He has no wheezes. He has no rales. He exhibits no tenderness.  Abdominal: Soft. Bowel sounds are normal. He exhibits no distension and no mass. There is no tenderness. There is no rebound and no guarding.  Musculoskeletal: Normal range of motion. He exhibits no edema and no tenderness.       Lumbar back: Normal. He exhibits normal range of motion, no tenderness, no bony tenderness, no swelling, no edema, no deformity, no pain and no spasm.  Lymphadenopathy:    He has no cervical adenopathy.  Neurological: He is alert and oriented to person, place, and time. He has normal strength. He displays no atrophy, no tremor and normal reflexes. No cranial nerve deficit or sensory deficit. He exhibits normal muscle tone. He displays a negative Romberg sign. He displays no seizure activity. Coordination and gait normal. He displays no Babinski's sign on the right side. He  displays no Babinski's sign on the left  side.  Reflex Scores:      Tricep reflexes are 1+ on the right side and 1+ on the left side.      Bicep reflexes are 1+ on the right side and 1+ on the left side.      Brachioradialis reflexes are 1+ on the right side.      Patellar reflexes are 1+ on the right side and 1+ on the left side.      Achilles reflexes are 1+ on the right side and 1+ on the left side. - SLR in BLE  Skin: Skin is warm and dry. No rash noted. He is not diaphoretic. No erythema. No pallor.  Psychiatric: He has a normal mood and affect. His behavior is normal. Judgment and thought content normal.     Lab Results  Component Value Date   WBC 10.1 04/25/2009   HGB 15.9 04/25/2009   HCT 45.9 04/25/2009   PLT 205 04/25/2009   GLUCOSE 116* 04/25/2009   CHOL 123 12/16/2010   TRIG 216.0* 12/16/2010   HDL 32.00* 12/16/2010   LDLDIRECT 60.1 12/16/2010   LDLCALC 58 09/14/2006   ALT 32 12/16/2010   AST 20 12/16/2010   NA 130* 04/25/2009   K 3.7 04/25/2009   CL 99 04/25/2009   CREATININE 0.63 04/25/2009   BUN 10 04/25/2009   CO2 25 04/25/2009   INR 0.9 ratio 04/22/2009       Assessment & Plan:

## 2012-09-05 NOTE — Assessment & Plan Note (Signed)
He has no s/s today but he is overdue for a f/up with cardiology

## 2012-09-07 ENCOUNTER — Ambulatory Visit: Payer: PRIVATE HEALTH INSURANCE | Admitting: Cardiology

## 2012-09-08 ENCOUNTER — Encounter: Payer: Self-pay | Admitting: Cardiology

## 2012-09-08 ENCOUNTER — Ambulatory Visit (INDEPENDENT_AMBULATORY_CARE_PROVIDER_SITE_OTHER): Payer: PRIVATE HEALTH INSURANCE | Admitting: Cardiology

## 2012-09-08 VITALS — BP 128/78 | HR 94 | Ht 66.0 in | Wt 202.0 lb

## 2012-09-08 DIAGNOSIS — I2510 Atherosclerotic heart disease of native coronary artery without angina pectoris: Secondary | ICD-10-CM

## 2012-09-08 DIAGNOSIS — M545 Low back pain: Secondary | ICD-10-CM

## 2012-09-08 DIAGNOSIS — Z720 Tobacco use: Secondary | ICD-10-CM

## 2012-09-08 DIAGNOSIS — F17200 Nicotine dependence, unspecified, uncomplicated: Secondary | ICD-10-CM

## 2012-09-08 DIAGNOSIS — E785 Hyperlipidemia, unspecified: Secondary | ICD-10-CM

## 2012-09-08 NOTE — Patient Instructions (Addendum)
Your physician wants you to follow-up in: 1 YEAR with Dr Cooper (previous pt of Dr Stuckey). You will receive a reminder letter in the mail two months in advance. If you don't receive a letter, please call our office to schedule the follow-up appointment.  Your physician recommends that you continue on your current medications as directed. Please refer to the Current Medication list given to you today.  

## 2012-09-08 NOTE — Progress Notes (Signed)
HPI:  The patient is seen in followup. He was recently seen in urgent care because of problems with discomfort in his right leg including the upper portion. It generally extends around from his right lower back. It has created some problems with walking. The patient has not had any ongoing cardiac symptoms. Fortunately he stopped smoking quite some time ago. We discussed his medications in some detail today. We also discussed the fact that he may need further workup regarding his back.  This visit was set up by his primary care physician. The patient is a dual antiplatelet therapy, but has a history of multiple stent implants.  Feels good at present.    Current Outpatient Prescriptions  Medication Sig Dispense Refill  . aspirin 81 MG tablet Take 81 mg by mouth daily.        . Cholecalciferol 50000 UNITS capsule Take 1 capsule (50,000 Units total) by mouth once a week.  12 capsule  3  . clopidogrel (PLAVIX) 75 MG tablet Take 1 tablet (75 mg total) by mouth daily.  30 tablet  6  . cyclobenzaprine (AMRIX) 30 MG 24 hr capsule Take 30 mg by mouth daily. For 21 days      . metoprolol tartrate (LOPRESSOR) 25 MG tablet Take 1 tablet (25 mg total) by mouth 2 (two) times daily.  180 tablet  2  . nitroGLYCERIN (NITROSTAT) 0.4 MG SL tablet Place 0.4 mg under the tongue every 5 (five) minutes as needed.        Marland Kitchen omega-3 acid ethyl esters (LOVAZA) 1 G capsule Take 2 capsules (2 g total) by mouth 2 (two) times daily.  120 capsule  11  . oxyCODONE-acetaminophen (PERCOCET/ROXICET) 5-325 MG per tablet Take 1 tablet by mouth every 6 (six) hours as needed for pain. Take 1-2 tabs po every 4 hrs prn  65 tablet  0  . rosuvastatin (CRESTOR) 40 MG tablet Take 1 tablet (40 mg total) by mouth daily.  90 tablet  3   No current facility-administered medications for this visit.    Allergies  Allergen Reactions  . Heparin     Low bp    Past Medical History  Diagnosis Date  . Coronary artery disease     s/p BMS to RCA  and CFX. s/p DES to RCA 2/2 ISR 2010  . Hypertension   . Hyperlipidemia   . Old myocardial infarction   . Paroxysmal ventricular tachycardia   . Asthma   . Diabetes mellitus without complication     Past Surgical History  Procedure Laterality Date  . Coronary stent placement  2002, 2007, 2010    right    Family History  Problem Relation Age of Onset  . Heart disease Mother     an siblings    History   Social History  . Marital Status: Married    Spouse Name: N/A    Number of Children: N/A  . Years of Education: N/A   Occupational History  . painter    Social History Main Topics  . Smoking status: Current Every Day Smoker -- 1.50 packs/day for 30 years    Types: Cigarettes  . Smokeless tobacco: Never Used     Comment: currently smoking 1 ppd.   . Alcohol Use: No  . Drug Use: No  . Sexually Active: Yes    Birth Control/ Protection: None   Other Topics Concern  . Not on file   Social History Narrative  . No narrative on file  ROS: Please see the HPI.  All other systems reviewed and negative.  PHYSICAL EXAM:  BP 128/78  Pulse 94  Ht 5\' 6"  (1.676 m)  Wt 202 lb (91.627 kg)  BMI 32.62 kg/m2  General: Well developed, well nourished, in no acute distress. Head:  Normocephalic and atraumatic. Neck: no JVD Lungs: Some decrease BS and prolonged expiration Heart: Normal S1 and S2.  No murmur, rubs or gallops.  Abdomen:  Normal bowel sounds; soft; non tender; no organomegaly Pulses: Pulses normal in all 4 extremities. Extremities: No clubbing or cyanosis. No edema. Neurologic: Alert and oriented x 3.  LE pulses intact.  Reflexes intact as well    EKG:  NSR.  Nonspecific ST and T wave changes.  New from last tracing.    ASSESSMENT AND PLAN:  1.  CAD --- multiple stents as noted.  Continue DAPT. 2

## 2012-09-11 NOTE — Assessment & Plan Note (Signed)
Not sure he was taking his meds.

## 2012-09-11 NOTE — Assessment & Plan Note (Signed)
Has non DES in CFX, and Cypher (first gen) for treatment of ISR in the RCA.  Has remained on DAPT.  Will continue for now given indication--ISR with First generation DES.  Perhaps after DAPT this can be revisited.

## 2012-09-11 NOTE — Assessment & Plan Note (Signed)
This may require MRI.  Pulses seem fairly good.  Have encouraged him to follow with Dr. Yetta Barre regarding.  He can come off Plavix for necessary treatment.

## 2012-09-11 NOTE — Assessment & Plan Note (Signed)
Fortunately stopped about a year ago.

## 2012-09-12 ENCOUNTER — Telehealth: Payer: Self-pay | Admitting: Internal Medicine

## 2012-09-12 DIAGNOSIS — M545 Low back pain: Secondary | ICD-10-CM

## 2012-09-12 MED ORDER — METHOCARBAMOL 500 MG PO TABS: 500.0000 mg | ORAL_TABLET | Freq: Four times a day (QID) | ORAL | Status: DC

## 2012-09-12 NOTE — Telephone Encounter (Signed)
done

## 2012-09-12 NOTE — Telephone Encounter (Signed)
LMOVM advising to check with pharmacy

## 2012-09-12 NOTE — Telephone Encounter (Signed)
Pt spouse called and stated that Amrix is too expensive for the pt and they were wondering if we have a saving card or if there another med that would be cheaper. Please call pt spouse

## 2012-09-25 ENCOUNTER — Other Ambulatory Visit: Payer: Self-pay

## 2012-09-25 MED ORDER — CLOPIDOGREL BISULFATE 75 MG PO TABS: 75.0000 mg | ORAL_TABLET | Freq: Every day | ORAL | Status: DC

## 2012-09-26 ENCOUNTER — Encounter: Payer: Self-pay | Admitting: Internal Medicine

## 2012-09-26 ENCOUNTER — Ambulatory Visit (INDEPENDENT_AMBULATORY_CARE_PROVIDER_SITE_OTHER): Payer: PRIVATE HEALTH INSURANCE | Admitting: Internal Medicine

## 2012-09-26 VITALS — BP 128/80 | HR 77 | Temp 97.8°F | Resp 16 | Wt 200.8 lb

## 2012-09-26 DIAGNOSIS — M545 Low back pain: Secondary | ICD-10-CM

## 2012-09-26 DIAGNOSIS — I10 Essential (primary) hypertension: Secondary | ICD-10-CM

## 2012-09-26 DIAGNOSIS — E781 Pure hyperglyceridemia: Secondary | ICD-10-CM

## 2012-09-26 DIAGNOSIS — E785 Hyperlipidemia, unspecified: Secondary | ICD-10-CM

## 2012-09-26 NOTE — Assessment & Plan Note (Signed)
He is doing well on crestor 

## 2012-09-26 NOTE — Assessment & Plan Note (Signed)
His BP is well controlled 

## 2012-09-26 NOTE — Progress Notes (Signed)
Subjective:    Patient ID: [name], male    DOB: 11-14-1960, 52 y.o.   MRN: [id number]  Diabetes He presents for his initial diabetic visit. He has type 2 diabetes mellitus. His disease course has been stable. Pertinent negatives for hypoglycemia include no dizziness or pallor. Pertinent negatives for diabetes include no blurred vision, no chest pain, no fatigue, no foot paresthesias, no foot ulcerations, no polydipsia, no polyphagia, no polyuria, no visual change, no weakness and no weight loss. There are no hypoglycemic complications. Symptoms are stable. Diabetic complications include heart disease. Current diabetic treatment includes diet. He is compliant with treatment most of the time. His weight is stable. He is following a generally healthy diet. Meal planning includes avoidance of concentrated sweets. He participates in exercise intermittently. There is no change in his home blood glucose trend. An ACE inhibitor/angiotensin II receptor blocker is not being taken. He does not see a podiatrist.Eye exam is not current.      Review of Systems  Constitutional: Negative.  Negative for fever, chills, weight loss, diaphoresis, activity change, appetite change, fatigue and unexpected weight change.  HENT: Negative.   Eyes: Negative.  Negative for blurred vision.  Respiratory: Negative.  Negative for cough, chest tightness, shortness of breath, wheezing and stridor.   Cardiovascular: Negative.  Negative for chest pain, palpitations and leg swelling.  Gastrointestinal: Negative.  Negative for nausea, vomiting, abdominal pain, diarrhea, constipation and blood in stool.  Endocrine: Negative.  Negative for polydipsia, polyphagia and polyuria.  Genitourinary: Negative.   Musculoskeletal: Negative.  Negative for myalgias, back pain, joint swelling, arthralgias and gait problem.  Skin: Negative.  Negative for color change, pallor, rash and wound.  Allergic/Immunologic: Negative.   Neurological:  Negative.  Negative for dizziness, weakness and light-headedness.  Hematological: Negative.  Negative for adenopathy. Does not bruise/bleed easily.  Psychiatric/Behavioral: Negative.        Objective:   Physical Exam  Vitals reviewed. Constitutional: He is oriented to person, place, and time. He appears well-developed and well-nourished. No distress.  HENT:  Head: Normocephalic and atraumatic.  Mouth/Throat: Oropharynx is clear and moist. No oropharyngeal exudate.  Eyes: Conjunctivae are normal. Right eye exhibits no discharge. Left eye exhibits no discharge. No scleral icterus.  Neck: Normal range of motion. Neck supple. No JVD present. No tracheal deviation present. No thyromegaly present.  Cardiovascular: Normal rate, regular rhythm, normal heart sounds and intact distal pulses.  Exam reveals no gallop and no friction rub.   No murmur heard. Pulmonary/Chest: Effort normal and breath sounds normal. No stridor. No respiratory distress. He has no wheezes. He has no rales. He exhibits no tenderness.  Abdominal: Soft. Bowel sounds are normal. He exhibits no distension and no mass. There is no tenderness. There is no rebound and no guarding.  Musculoskeletal: Normal range of motion. He exhibits no edema and no tenderness.  Lymphadenopathy:    He has no cervical adenopathy.  Neurological: He is oriented to person, place, and time.  Skin: Skin is warm and dry. No rash noted. He is not diaphoretic. No erythema. No pallor.  Psychiatric: He has a normal mood and affect. His behavior is normal. Judgment and thought content normal.     Lab Results  Component Value Date   WBC 10.1 04/25/2009   HGB 15.9 04/25/2009   HCT 45.9 04/25/2009   PLT 205 04/25/2009   GLUCOSE 146* 09/05/2012   CHOL 123 12/16/2010   TRIG 402 08/20/2012   HDL 32.00* 12/16/2010   LDLDIRECT  129 08/20/2012   LDLCALC 107 08/20/2012   ALT 34 08/20/2012   AST 20 08/20/2012   NA 135 09/05/2012   K 3.9 09/05/2012   CL 100 09/05/2012    CREATININE 0.7 09/05/2012   BUN 14 09/05/2012   CO2 25 09/05/2012   INR 0.9 ratio 04/22/2009   HGBA1C 7.4* 09/05/2012   MICROALBUR 6.1* 09/05/2012       Assessment & Plan:

## 2012-09-26 NOTE — Assessment & Plan Note (Signed)
He does not want to start a med yet, he will work on his lifestyle modifications 

## 2012-09-26 NOTE — Patient Instructions (Signed)
Diabetes, Type 2 Diabetes is a long-lasting (chronic) disease. In type 2 diabetes, the pancreas does not make enough insulin (a hormone), and the body does not respond normally to the insulin that is made. This type of diabetes was also previously called adult-onset diabetes. It usually occurs after the age of 43, but it can occur at any age.  CAUSES  Type 2 diabetes happens because the pancreasis not making enough insulin or your body has trouble using the insulin that your pancreas does make properly. SYMPTOMS   Drinking more than usual.  Urinating more than usual.  Blurred vision.  Dry, itchy skin.  Frequent infections.  Feeling more tired than usual (fatigue). DIAGNOSIS The diagnosis of type 2 diabetes is usually made by one of the following tests:  Fasting blood glucose test. You will not eat for at least 8 hours and then take a blood test.  Random blood glucose test. Your blood glucose (sugar) is checked at any time of the day regardless of when you ate.  Oral glucose tolerance test (OGTT). Your blood glucose is measured after you have not eaten (fasted) and then after you drink a glucose containing beverage. TREATMENT   Healthy eating.  Exercise.  Medicine, if needed.  Monitoring blood glucose.  Seeing your caregiver regularly. HOME CARE INSTRUCTIONS   Check your blood glucose at least once a day. More frequent monitoring may be necessary, depending on your medicines and on how well your diabetes is controlled. Your caregiver will advise you.  Take your medicine as directed by your caregiver.  Do not smoke.  Make wise food choices. Ask your caregiver for information. Weight loss can improve your diabetes.  Learn about low blood glucose (hypoglycemia) and how to treat it.  Get your eyes checked regularly.  Have a yearly physical exam. Have your blood pressure checked and your blood and urine tested.  Wear a pendant or bracelet saying that you have  diabetes.  Check your feet every night for cuts, sores, blisters, and redness. Let your caregiver know if you have any problems. SEEK MEDICAL CARE IF:   You have problems keeping your blood glucose in target range.  You have problems with your medicines.  You have symptoms of an illness that do not improve after 24 hours.  You have a sore or wound that is not healing.  You notice a change in vision or a new problem with your vision.  You have a fever. MAKE SURE YOU:  Understand these instructions.  Will watch your condition.  Will get help right away if you are not doing well or get worse. Document Released: 06/07/2005 Document Revised: 08/30/2011 Document Reviewed: 11/23/2010 [facility] Patient Information 2013 [place], [place]. A1c, Hemoglobin A1c The A1c (hemoglobin A1c, glycosylated hemoglobin) test checks the average amount of sugar (glucose) in the blood over the last 2 to 3 months. It does this by measuring the concentration of glycosylated hemoglobin. As glucose circulates in the blood, some of it binds to hemoglobin A. This is the main form of hemoglobin in adults. Hemoglobin is a red protein that carries oxygen in the red blood cells (RBCs). Once the glucose is bound to the hemoglobin A, it remains there for the life of the red blood cell (about 120 days). This combination of glucose and hemoglobin A is called A1c. Increased glucose in the blood, increases the hemoglobin A1c. A1c levels do not change quickly but will shift as RBCs are replaced. A1c is a valuable test because it enables  you to know how your glucose has been controlled over the past 3 months.  5% A1c  Estimated Average Glucose mg/dL: 97  6% Z6X  Estimated Average Glucose mg/dL: 096  7% E4V  Estimated Average Glucose mg/dL: 409  8% W1X  Estimated Average Glucose mg/dL: 914  9% N8G  Estimated Average Glucose mg/dL: 956  21% H0Q  Estimated Average Glucose mg/dL: 657  84% O9G  Estimated Average  Glucose mg/dL: 295  28% U1L  Estimated Average Glucose mg/dL: 244 The American Diabetes Association (ADA) recommends testing your A1c level 4 times each year if you have type 1 or type 2 diabetes and use insulin; or 2 times each year if you have type 2 diabetes and do not use insulin. When someone is first diagnosed with diabetes or if control is not good, A1c may be ordered more frequently. PREPARATION FOR TEST No preparation or fasting is necessary for this blood sample. NORMAL FINDINGS   Adults without diabetes: 2.2 to 4.8%  Children without diabetes: 1.8 to 4.0%  Good diabetic control: 2.5 to 5.9%  Fair diabetic control: 6 to 8%  Poor diabetic control: greater than 8% The values of A1c may be falsely low in pregnancy, in disorders with shortened red blood cell lives, or in sickle cell disease or trait (carrier). The values may be falsely high in disorders with longer red cell lives, or in people with Thallassemia, kidney failure, or iron deficiency anemia. Ranges for normal findings may vary among different laboratories and hospitals. You should always check with your doctor after having lab work or other tests done to discuss the meaning of your test results and whether your values are considered within normal limits. MEANING OF TEST  Your caregiver will go over the test results with you and discuss the importance and meaning of your results, as well as treatment options and the need for additional tests if necessary. If your A1c is greater than 7%, discuss treatment options with your caregiver. OBTAINING THE TEST RESULTS  It is your responsibility to obtain your test results. Ask the lab or department performing the test when and how you will get your results. Document Released: 06/29/2004 Document Revised: 08/30/2011 Document Reviewed: 05/11/2008 [facility] Patient Information 2013 [place], [place].

## 2012-09-26 NOTE — Assessment & Plan Note (Signed)
This has resolved.

## 2012-09-26 NOTE — Assessment & Plan Note (Signed)
He is doing well on lovaza

## 2012-09-29 ENCOUNTER — Institutional Professional Consult (permissible substitution): Payer: PRIVATE HEALTH INSURANCE | Admitting: Pulmonary Disease

## 2012-12-28 ENCOUNTER — Other Ambulatory Visit (INDEPENDENT_AMBULATORY_CARE_PROVIDER_SITE_OTHER): Payer: PRIVATE HEALTH INSURANCE

## 2012-12-28 ENCOUNTER — Ambulatory Visit (INDEPENDENT_AMBULATORY_CARE_PROVIDER_SITE_OTHER): Payer: PRIVATE HEALTH INSURANCE | Admitting: Internal Medicine

## 2012-12-28 ENCOUNTER — Encounter: Payer: Self-pay | Admitting: Internal Medicine

## 2012-12-28 VITALS — BP 146/88 | HR 76 | Temp 97.4°F | Resp 16 | Ht 66.0 in | Wt 203.0 lb

## 2012-12-28 DIAGNOSIS — E785 Hyperlipidemia, unspecified: Secondary | ICD-10-CM

## 2012-12-28 DIAGNOSIS — E781 Pure hyperglyceridemia: Secondary | ICD-10-CM

## 2012-12-28 DIAGNOSIS — I10 Essential (primary) hypertension: Secondary | ICD-10-CM

## 2012-12-28 DIAGNOSIS — I517 Cardiomegaly: Secondary | ICD-10-CM

## 2012-12-28 DIAGNOSIS — IMO0001 Reserved for inherently not codable concepts without codable children: Secondary | ICD-10-CM

## 2012-12-28 LAB — BASIC METABOLIC PANEL
CO2: 28 mEq/L (ref 19–32)
Calcium: 9.8 mg/dL (ref 8.4–10.5)
GFR: 104.39 mL/min (ref >60.00)
Glucose, Bld: 205 mg/dL — ABNORMAL HIGH (ref 70–99)
Potassium: 4.2 mEq/L (ref 3.5–5.1)
Sodium: 139 mEq/L (ref 135–145)

## 2012-12-28 LAB — LIPID PANEL
HDL: 34.3 mg/dL — ABNORMAL LOW (ref >39.00)
Triglycerides: 417 mg/dL — ABNORMAL HIGH (ref 0.0–149.0)
VLDL: 83.4 mg/dL — ABNORMAL HIGH (ref 0.0–40.0)

## 2012-12-28 MED ORDER — OLMESARTAN MEDOXOMIL 20 MG PO TABS: 20.0000 mg | ORAL_TABLET | Freq: Every day | ORAL | Status: DC

## 2012-12-28 NOTE — Assessment & Plan Note (Addendum)
I will recheck his A1C today and will treat if needed  Late note, his A1C is getting higher so I have asked him to start [id number]-[id number]

## 2012-12-28 NOTE — Assessment & Plan Note (Signed)
His BP is not adequately well controlled I think Benicar is a good choice for him

## 2012-12-28 NOTE — Assessment & Plan Note (Signed)
He is doing well on crestor 

## 2012-12-28 NOTE — Assessment & Plan Note (Signed)
Recheck the FLP today He has improved his lifestyle modifications

## 2012-12-28 NOTE — Patient Instructions (Signed)
Type 2 Diabetes Mellitus, Adult Type 2 diabetes mellitus, often simply referred to as type 2 diabetes, is a long-lasting (chronic) disease. In type 2 diabetes, the pancreas does not make enough insulin (a hormone), the cells are less responsive to the insulin that is made (insulin resistance), or both. Normally, insulin moves sugars from food into the tissue cells. The tissue cells use the sugars for energy. The lack of insulin or the lack of normal response to insulin causes excess sugars to build up in the blood instead of going into the tissue cells. As a result, high blood sugar (hyperglycemia) develops. The effect of high sugar (glucose) levels can cause many complications. Type 2 diabetes was also previously called adult-onset diabetes but it can occur at any age.  RISK FACTORS  A person is predisposed to developing type 2 diabetes if someone in the family has the disease and also has one or more of the following primary risk factors:  Overweight.  An inactive lifestyle.  A history of consistently eating high-calorie foods. Maintaining a normal weight and regular physical activity can reduce the chance of developing type 2 diabetes. SYMPTOMS  A person with type 2 diabetes may not show symptoms initially. The symptoms of type 2 diabetes appear slowly. The symptoms include:  Increased thirst (polydipsia).  Increased urination (polyuria).  Increased urination during the night (nocturia).  Weight loss. This weight loss may be rapid.  Frequent, recurring infections.  Tiredness (fatigue).  Weakness.  Vision changes, such as blurred vision.  Fruity smell to your breath.  Abdominal pain.  Nausea or vomiting.  Cuts or bruises which are slow to heal.  Tingling or numbness in the hands or feet. DIAGNOSIS Type 2 diabetes is frequently not diagnosed until complications of diabetes are present. Type 2 diabetes is diagnosed when symptoms or complications are present and when blood  glucose levels are increased. Your blood glucose level may be checked by one or more of the following blood tests:  A fasting blood glucose test. You will not be allowed to eat for at least 8 hours before a blood sample is taken.  A random blood glucose test. Your blood glucose is checked at any time of the day regardless of when you ate.  A hemoglobin A1c blood glucose test. A hemoglobin A1c test provides information about blood glucose control over the previous 3 months.  An oral glucose tolerance test (OGTT). Your blood glucose is measured after you have not eaten (fasted) for 2 hours and then after you drink a glucose-containing beverage. TREATMENT   You may need to take insulin or diabetes medicine daily to keep blood glucose levels in the desired range.  You will need to match insulin dosing with exercise and healthy food choices. The treatment goal is to maintain the before meal blood sugar (preprandial glucose) level at 70 130 mg/dL. HOME CARE INSTRUCTIONS   Have your hemoglobin A1c level checked twice a year.  Perform daily blood glucose monitoring as directed by your caregiver.  Monitor urine ketones when you are ill and as directed by your caregiver.  Take your diabetes medicine or insulin as directed by your caregiver to maintain your blood glucose levels in the desired range.  Never run out of diabetes medicine or insulin. It is needed every day.  Adjust insulin based on your intake of carbohydrates. Carbohydrates can raise blood glucose levels but need to be included in your diet. Carbohydrates provide vitamins, minerals, and fiber which are an essential part of   a healthy diet. Carbohydrates are found in fruits, vegetables, whole grains, dairy products, legumes, and foods containing added sugars.    Eat healthy foods. Alternate 3 meals with 3 snacks.  Lose weight if overweight.  Carry a medical alert card or wear your medical alert jewelry.  Carry a 15 gram  carbohydrate snack with you at all times to treat low blood glucose (hypoglycemia). Some examples of 15 gram carbohydrate snacks include:  Glucose tablets, 3 or 4   Glucose gel, 15 gram tube  Raisins, 2 tablespoons (24 grams)  Jelly beans, 6  Animal crackers, 8  Regular pop, 4 ounces (120 mL)  Gummy treats, 9  Recognize hypoglycemia. Hypoglycemia occurs with blood glucose levels of 70 mg/dL and below. The risk for hypoglycemia increases when fasting or skipping meals, during or after intense exercise, and during sleep. Hypoglycemia symptoms can include:  Tremors or shakes.  Decreased ability to concentrate.  Sweating.  Increased heart rate.  Headache.  Dry mouth.  Hunger.  Irritability.  Anxiety.  Restless sleep.  Altered speech or coordination.  Confusion.  Treat hypoglycemia promptly. If you are alert and able to safely swallow, follow the 15:15 rule:  Take 15 20 grams of rapid-acting glucose or carbohydrate. Rapid-acting options include glucose gel, glucose tablets, or 4 ounces (120 mL) of fruit juice, regular soda, or low fat milk.  Check your blood glucose level 15 minutes after taking the glucose.  Take 15 20 grams more of glucose if the repeat blood glucose level is still 70 mg/dL or below.  Eat a meal or snack within 1 hour once blood glucose levels return to normal.    Be alert to polyuria and polydipsia which are early signs of hyperglycemia. An early awareness of hyperglycemia allows for prompt treatment. Treat hyperglycemia as directed by your caregiver.  Engage in at least 150 minutes of moderate-intensity physical activity a week, spread over at least 3 days of the week or as directed by your caregiver. In addition, you should engage in resistance exercise at least 2 times a week or as directed by your caregiver.  Adjust your medicine and food intake as needed if you start a new exercise or sport.  Follow your sick day plan at any time you  are unable to eat or drink as usual.  Avoid tobacco use.  Limit alcohol intake to no more than 1 drink per day for nonpregnant women and 2 drinks per day for men. You should drink alcohol only when you are also eating food. Talk with your caregiver whether alcohol is safe for you. Tell your caregiver if you drink alcohol several times a week.  Follow up with your caregiver regularly.  Schedule an eye exam soon after the diagnosis of type 2 diabetes and then annually.  Perform daily skin and foot care. Examine your skin and feet daily for cuts, bruises, redness, nail problems, bleeding, blisters, or sores. A foot exam by a caregiver should be done annually.  Brush your teeth and gums at least twice a day and floss at least once a day. Follow up with your dentist regularly.  Share your diabetes management plan with your workplace or school.  Stay up-to-date with immunizations.  Learn to manage stress.  Obtain ongoing diabetes education and support as needed.  Participate in, or seek rehabilitation as needed to maintain or improve independence and quality of life. Request a physical or occupational therapy referral if you are having foot or hand numbness or difficulties with grooming,   dressing, eating, or physical activity. SEEK MEDICAL CARE IF:   You are unable to eat food or drink fluids for more than 6 hours.  You have nausea and vomiting for more than 6 hours.  Your blood glucose level is over 240 mg/dL.  There is a change in mental status.  You develop an additional serious illness.  You have diarrhea for more than 6 hours.  You have been sick or have had a fever for a couple of days and are not getting better.  You have pain during any physical activity.  SEEK IMMEDIATE MEDICAL CARE IF:  You have difficulty breathing.  You have moderate to large ketone levels. MAKE SURE YOU:  Understand these instructions.  Will watch your condition.  Will get help right away if  you are not doing well or get worse. Document Released: 06/07/2005 Document Revised: 03/01/2012 Document Reviewed: 01/04/2012 [employer] Patient Information 2014 [employer].  

## 2012-12-28 NOTE — Progress Notes (Signed)
Subjective:    Patient ID: [name], male    DOB: [birth year], 52 y.o.   MRN: [id number]  Diabetes He presents for his follow-up diabetic visit. He has type 2 diabetes mellitus. His disease course has been stable. There are no hypoglycemic associated symptoms. Pertinent negatives for hypoglycemia include no dizziness, headaches or tremors. Pertinent negatives for diabetes include no blurred vision, no chest pain, no fatigue, no foot paresthesias, no foot ulcerations, no polydipsia, no polyphagia, no polyuria, no visual change, no weakness and no weight loss. There are no hypoglycemic complications. Symptoms are stable. There are no diabetic complications. Current diabetic treatment includes diet. He is compliant with treatment most of the time. His weight is stable. He is following a generally healthy diet. Meal planning includes avoidance of concentrated sweets. He has not had a previous visit with a dietician. He never participates in exercise. There is no change in his home blood glucose trend. An ACE inhibitor/angiotensin II receptor blocker is not being taken. He does not see a podiatrist.Eye exam is current.      Review of Systems  Constitutional: Negative.  Negative for chills, weight loss, diaphoresis and fatigue.  HENT: Negative.   Eyes: Negative.  Negative for blurred vision.  Respiratory: Negative.  Negative for cough, chest tightness, shortness of breath, wheezing and stridor.   Cardiovascular: Negative.  Negative for chest pain, palpitations and leg swelling.  Gastrointestinal: Negative.  Negative for nausea, vomiting, abdominal pain, diarrhea and constipation.  Endocrine: Negative.  Negative for polydipsia, polyphagia and polyuria.  Genitourinary: Negative.   Musculoskeletal: Negative.   Skin: Negative.   Allergic/Immunologic: Negative.   Neurological: Negative.  Negative for dizziness, tremors, weakness, light-headedness and headaches.  Hematological: Negative.  Negative  for adenopathy. Does not bruise/bleed easily.  Psychiatric/Behavioral: Negative.        Objective:   Physical Exam  Vitals reviewed. Constitutional: He is oriented to person, place, and time. He appears well-developed and well-nourished. No distress.  HENT:  Head: Normocephalic and atraumatic.  Mouth/Throat: Oropharynx is clear and moist. No oropharyngeal exudate.  Eyes: Conjunctivae are normal. Right eye exhibits no discharge. Left eye exhibits no discharge. No scleral icterus.  Neck: Normal range of motion. Neck supple. No JVD present. No tracheal deviation present. No thyromegaly present.  Cardiovascular: Normal rate, regular rhythm, normal heart sounds and intact distal pulses.  Exam reveals no gallop and no friction rub.   No murmur heard. Pulmonary/Chest: Effort normal and breath sounds normal. No stridor. No respiratory distress. He has no wheezes. He has no rales. He exhibits no tenderness.  Abdominal: Soft. Bowel sounds are normal. He exhibits no distension and no mass. There is no tenderness. There is no rebound and no guarding.  Musculoskeletal: Normal range of motion. He exhibits no edema and no tenderness.  Lymphadenopathy:    He has no cervical adenopathy.  Neurological: He is oriented to person, place, and time.  Skin: Skin is warm and dry. No rash noted. He is not diaphoretic. No erythema. No pallor.  Psychiatric: He has a normal mood and affect. His behavior is normal. Judgment and thought content normal.     Lab Results  Component Value Date   WBC 10.1 04/25/2009   HGB 15.9 04/25/2009   HCT 45.9 04/25/2009   PLT 205 04/25/2009   GLUCOSE 146* 09/05/2012   CHOL 123 12/16/2010   TRIG 402 08/20/2012   HDL 32.00* 12/16/2010   LDLDIRECT 129 08/20/2012   LDLCALC 107 08/20/2012   ALT 34 08/20/2012  AST 20 08/20/2012   NA 135 09/05/2012   K 3.9 09/05/2012   CL 100 09/05/2012   CREATININE 0.7 09/05/2012   BUN 14 09/05/2012   CO2 25 09/05/2012   INR 0.9 ratio 04/22/2009   HGBA1C 7.4*  09/05/2012   MICROALBUR 6.1* 09/05/2012       Assessment & Plan:

## 2012-12-29 ENCOUNTER — Encounter: Payer: Self-pay | Admitting: Internal Medicine

## 2012-12-29 MED ORDER — SITAGLIP PHOS-METFORMIN HCL ER 100-1000 MG PO TB24: 1.0000 | ORAL_TABLET | Freq: Every day | ORAL | Status: DC

## 2012-12-29 NOTE — Addendum Note (Signed)
Addended by: Etta Grandchild on: 12/29/2012 07:41 AM   Modules accepted: Orders

## 2013-01-16 ENCOUNTER — Telehealth: Payer: Self-pay | Admitting: *Deleted

## 2013-01-16 MED ORDER — SITAGLIPTIN PHOS-METFORMIN HCL 50-500 MG PO TABS: 1.0000 | ORAL_TABLET | Freq: Two times a day (BID) | ORAL | Status: DC

## 2013-01-16 NOTE — Telephone Encounter (Signed)
Pts wife called states the Janumet XR was too expensive even with insurance.  Requesting something less expensive.

## 2013-01-16 NOTE — Telephone Encounter (Signed)
Changed to the BID form If that is still too expensive then come get samples and a co-pay card

## 2013-01-17 ENCOUNTER — Telehealth: Payer: Self-pay | Admitting: *Deleted

## 2013-01-17 NOTE — Telephone Encounter (Signed)
Danford Bad, pharmacist called states the Janumet 50/500mg  is also too expensive.  Janumet is not covered by insurance.  Suggests Metformin by itself with Januvia.  Januvia is $75 and MEtformin is $10.  Danford Bad requests a phone call from you to discuss.

## 2013-01-17 NOTE — Telephone Encounter (Signed)
Ask him to come get samples

## 2013-01-17 NOTE — Telephone Encounter (Signed)
Spoke with pt advised him of MDs message

## 2013-07-04 ENCOUNTER — Other Ambulatory Visit: Payer: Self-pay | Admitting: Cardiology

## 2013-07-12 ENCOUNTER — Other Ambulatory Visit: Payer: Self-pay | Admitting: Cardiovascular Disease

## 2013-07-12 ENCOUNTER — Other Ambulatory Visit: Payer: Self-pay

## 2013-07-12 MED ORDER — CLOPIDOGREL BISULFATE 75 MG PO TABS: 75.0000 mg | ORAL_TABLET | Freq: Every day | ORAL | Status: DC

## 2013-08-11 ENCOUNTER — Other Ambulatory Visit: Payer: Self-pay | Admitting: Internal Medicine

## 2013-08-13 ENCOUNTER — Telehealth: Payer: Self-pay | Admitting: *Deleted

## 2013-08-13 NOTE — Telephone Encounter (Signed)
Spouse phoned stating pharmacy couldn't refill patient's vit D.  Last OV with PCP 12/28/12.  Per chart review PCP refused to refill until patient has an OV.  Transferred spouse to scheduling for an appt.  CB# [phone]

## 2013-08-15 ENCOUNTER — Ambulatory Visit: Payer: PRIVATE HEALTH INSURANCE | Admitting: Internal Medicine

## 2013-08-21 ENCOUNTER — Encounter: Payer: Self-pay | Admitting: Internal Medicine

## 2013-08-21 ENCOUNTER — Other Ambulatory Visit (INDEPENDENT_AMBULATORY_CARE_PROVIDER_SITE_OTHER): Payer: Managed Care, Other (non HMO)

## 2013-08-21 ENCOUNTER — Telehealth: Payer: Self-pay | Admitting: *Deleted

## 2013-08-21 ENCOUNTER — Ambulatory Visit (INDEPENDENT_AMBULATORY_CARE_PROVIDER_SITE_OTHER): Payer: Managed Care, Other (non HMO) | Admitting: Internal Medicine

## 2013-08-21 VITALS — BP 132/80 | HR 76 | Temp 97.7°F | Resp 16 | Ht 66.0 in | Wt 206.0 lb

## 2013-08-21 DIAGNOSIS — E1165 Type 2 diabetes mellitus with hyperglycemia: Principal | ICD-10-CM

## 2013-08-21 DIAGNOSIS — Z0000 Encounter for general adult medical examination without abnormal findings: Secondary | ICD-10-CM

## 2013-08-21 DIAGNOSIS — IMO0001 Reserved for inherently not codable concepts without codable children: Secondary | ICD-10-CM

## 2013-08-21 DIAGNOSIS — E785 Hyperlipidemia, unspecified: Secondary | ICD-10-CM

## 2013-08-21 DIAGNOSIS — I2510 Atherosclerotic heart disease of native coronary artery without angina pectoris: Secondary | ICD-10-CM

## 2013-08-21 DIAGNOSIS — E781 Pure hyperglyceridemia: Secondary | ICD-10-CM

## 2013-08-21 DIAGNOSIS — Z23 Encounter for immunization: Secondary | ICD-10-CM

## 2013-08-21 DIAGNOSIS — I10 Essential (primary) hypertension: Secondary | ICD-10-CM

## 2013-08-21 LAB — CBC WITH DIFFERENTIAL/PLATELET
BASOS PCT: 0.2 % (ref 0.0–3.0)
Basophils Absolute: 0 10*3/uL (ref 0.0–0.1)
EOS ABS: 0.2 10*3/uL (ref 0.0–0.7)
Eosinophils Relative: 2.4 % (ref 0.0–5.0)
HCT: 43 % (ref 39.0–52.0)
Hemoglobin: 14.3 g/dL (ref 13.0–17.0)
Lymphocytes Relative: 28.9 % (ref 12.0–46.0)
Lymphs Abs: 2.8 10*3/uL (ref 0.7–4.0)
MCHC: 33.1 g/dL (ref 30.0–36.0)
MCV: 82.4 fl (ref 78.0–100.0)
Monocytes Absolute: 0.6 10*3/uL (ref 0.1–1.0)
Monocytes Relative: 6.6 % (ref 3.0–12.0)
Neutro Abs: 5.9 10*3/uL (ref 1.4–7.7)
Neutrophils Relative %: 61.9 % (ref 43.0–77.0)
Platelets: 243 10*3/uL (ref 150.0–400.0)
RBC: 5.22 Mil/uL (ref 4.22–5.81)
RDW: 14 % (ref 11.5–14.6)
WBC: 9.6 10*3/uL (ref 4.5–10.5)

## 2013-08-21 LAB — COMPREHENSIVE METABOLIC PANEL
ALT: 31 U/L (ref 0–53)
AST: 22 U/L (ref 0–37)
Albumin: 4.3 g/dL (ref 3.5–5.2)
Alkaline Phosphatase: 37 U/L — ABNORMAL LOW (ref 39–117)
BUN: 17 mg/dL (ref 6–23)
CALCIUM: 9.8 mg/dL (ref 8.4–10.5)
CO2: 26 mEq/L (ref 19–32)
Chloride: 102 mEq/L (ref 96–112)
Creatinine, Ser: 0.8 mg/dL (ref 0.4–1.5)
GFR: 104.12 mL/min (ref >60.00)
GLUCOSE: 165 mg/dL — AB (ref 70–99)
Potassium: 3.7 mEq/L (ref 3.5–5.1)
Sodium: 135 mEq/L (ref 135–145)
Total Bilirubin: 0.8 mg/dL (ref 0.3–1.2)
Total Protein: 7.4 g/dL (ref 6.0–8.3)

## 2013-08-21 LAB — LIPID PANEL
CHOLESTEROL: 137 mg/dL (ref 0–200)
HDL: 31 mg/dL — ABNORMAL LOW (ref >39.00)
LDL Cholesterol: 35 mg/dL (ref 0–99)
Total CHOL/HDL Ratio: 4
Triglycerides: 353 mg/dL — ABNORMAL HIGH (ref 0.0–149.0)
VLDL: 70.6 mg/dL — AB (ref 0.0–40.0)

## 2013-08-21 LAB — PSA: PSA: 0.22 ng/mL (ref 0.10–4.00)

## 2013-08-21 LAB — TSH: TSH: 3.35 u[IU]/mL (ref 0.35–5.50)

## 2013-08-21 LAB — FECAL OCCULT BLOOD, GUAIAC: FECAL OCCULT BLD: NEGATIVE

## 2013-08-21 LAB — HEMOGLOBIN A1C: Hgb A1c MFr Bld: 6.9 % — ABNORMAL HIGH (ref 4.6–6.5)

## 2013-08-21 MED ORDER — OMEGA-3-ACID ETHYL ESTERS 1 G PO CAPS: 2.0000 g | ORAL_CAPSULE | Freq: Two times a day (BID) | ORAL | Status: DC

## 2013-08-21 NOTE — Patient Instructions (Signed)
Type 2 Diabetes Mellitus, Adult Type 2 diabetes mellitus, often simply referred to as type 2 diabetes, is a long-lasting (chronic) disease. In type 2 diabetes, the pancreas does not make enough insulin (a hormone), the cells are less responsive to the insulin that is made (insulin resistance), or both. Normally, insulin moves sugars from food into the tissue cells. The tissue cells use the sugars for energy. The lack of insulin or the lack of normal response to insulin causes excess sugars to build up in the blood instead of going into the tissue cells. As a result, high blood sugar (hyperglycemia) develops. The effect of high sugar (glucose) levels can cause many complications. Type 2 diabetes was also previously called adult-onset diabetes but it can occur at any age.  RISK FACTORS  A person is predisposed to developing type 2 diabetes if someone in the family has the disease and also has one or more of the following primary risk factors:  Overweight.  An inactive lifestyle.  A history of consistently eating high-calorie foods. Maintaining a normal weight and regular physical activity can reduce the chance of developing type 2 diabetes. SYMPTOMS  A person with type 2 diabetes may not show symptoms initially. The symptoms of type 2 diabetes appear slowly. The symptoms include:  Increased thirst (polydipsia).  Increased urination (polyuria).  Increased urination during the night (nocturia).  Weight loss. This weight loss may be rapid.  Frequent, recurring infections.  Tiredness (fatigue).  Weakness.  Vision changes, such as blurred vision.  Fruity smell to your breath.  Abdominal pain.  Nausea or vomiting.  Cuts or bruises which are slow to heal.  Tingling or numbness in the hands or feet. DIAGNOSIS Type 2 diabetes is frequently not diagnosed until complications of diabetes are present. Type 2 diabetes is diagnosed when symptoms or complications are present and when blood  glucose levels are increased. Your blood glucose level may be checked by one or more of the following blood tests:  A fasting blood glucose test. You will not be allowed to eat for at least 8 hours before a blood sample is taken.  A random blood glucose test. Your blood glucose is checked at any time of the day regardless of when you ate.  A hemoglobin A1c blood glucose test. A hemoglobin A1c test provides information about blood glucose control over the previous 3 months.  An oral glucose tolerance test (OGTT). Your blood glucose is measured after you have not eaten (fasted) for 2 hours and then after you drink a glucose-containing beverage. TREATMENT   You may need to take insulin or diabetes medicine daily to keep blood glucose levels in the desired range.  You will need to match insulin dosing with exercise and healthy food choices. The treatment goal is to maintain the before meal blood sugar (preprandial glucose) level at 70 130 mg/dL. HOME CARE INSTRUCTIONS   Have your hemoglobin A1c level checked twice a year.  Perform daily blood glucose monitoring as directed by your caregiver.  Monitor urine ketones when you are ill and as directed by your caregiver.  Take your diabetes medicine or insulin as directed by your caregiver to maintain your blood glucose levels in the desired range.  Never run out of diabetes medicine or insulin. It is needed every day.  Adjust insulin based on your intake of carbohydrates. Carbohydrates can raise blood glucose levels but need to be included in your diet. Carbohydrates provide vitamins, minerals, and fiber which are an essential part of   a healthy diet. Carbohydrates are found in fruits, vegetables, whole grains, dairy products, legumes, and foods containing added sugars.    Eat healthy foods. Alternate 3 meals with 3 snacks.  Lose weight if overweight.  Carry a medical alert card or wear your medical alert jewelry.  Carry a 15 gram  carbohydrate snack with you at all times to treat low blood glucose (hypoglycemia). Some examples of 15 gram carbohydrate snacks include:  Glucose tablets, 3 or 4   Glucose gel, 15 gram tube  Raisins, 2 tablespoons (24 grams)  Jelly beans, 6  Animal crackers, 8  Regular pop, 4 ounces (120 mL)  Gummy treats, 9  Recognize hypoglycemia. Hypoglycemia occurs with blood glucose levels of 70 mg/dL and below. The risk for hypoglycemia increases when fasting or skipping meals, during or after intense exercise, and during sleep. Hypoglycemia symptoms can include:  Tremors or shakes.  Decreased ability to concentrate.  Sweating.  Increased heart rate.  Headache.  Dry mouth.  Hunger.  Irritability.  Anxiety.  Restless sleep.  Altered speech or coordination.  Confusion.  Treat hypoglycemia promptly. If you are alert and able to safely swallow, follow the 15:15 rule:  Take 15 20 grams of rapid-acting glucose or carbohydrate. Rapid-acting options include glucose gel, glucose tablets, or 4 ounces (120 mL) of fruit juice, regular soda, or low fat milk.  Check your blood glucose level 15 minutes after taking the glucose.  Take 15 20 grams more of glucose if the repeat blood glucose level is still 70 mg/dL or below.  Eat a meal or snack within 1 hour once blood glucose levels return to normal.    Be alert to polyuria and polydipsia which are early signs of hyperglycemia. An early awareness of hyperglycemia allows for prompt treatment. Treat hyperglycemia as directed by your caregiver.  Engage in at least 150 minutes of moderate-intensity physical activity a week, spread over at least 3 days of the week or as directed by your caregiver. In addition, you should engage in resistance exercise at least 2 times a week or as directed by your caregiver.  Adjust your medicine and food intake as needed if you start a new exercise or sport.  Follow your sick day plan at any time you  are unable to eat or drink as usual.  Avoid tobacco use.  Limit alcohol intake to no more than 1 drink per day for nonpregnant women and 2 drinks per day for men. You should drink alcohol only when you are also eating food. Talk with your caregiver whether alcohol is safe for you. Tell your caregiver if you drink alcohol several times a week.  Follow up with your caregiver regularly.  Schedule an eye exam soon after the diagnosis of type 2 diabetes and then annually.  Perform daily skin and foot care. Examine your skin and feet daily for cuts, bruises, redness, nail problems, bleeding, blisters, or sores. A foot exam by a caregiver should be done annually.  Brush your teeth and gums at least twice a day and floss at least once a day. Follow up with your dentist regularly.  Share your diabetes management plan with your workplace or school.  Stay up-to-date with immunizations.  Learn to manage stress.  Obtain ongoing diabetes education and support as needed.  Participate in, or seek rehabilitation as needed to maintain or improve independence and quality of life. Request a physical or occupational therapy referral if you are having foot or hand numbness or difficulties with grooming,   dressing, eating, or physical activity. SEEK MEDICAL CARE IF:   You are unable to eat food or drink fluids for more than 6 hours.  You have nausea and vomiting for more than 6 hours.  Your blood glucose level is over 240 mg/dL.  There is a change in mental status.  You develop an additional serious illness.  You have diarrhea for more than 6 hours.  You have been sick or have had a fever for a couple of days and are not getting better.  You have pain during any physical activity.  SEEK IMMEDIATE MEDICAL CARE IF:  You have difficulty breathing.  You have moderate to large ketone levels. MAKE SURE YOU:  Understand these instructions.  Will watch your condition.  Will get help right away if  you are not doing well or get worse. Document Released: 06/07/2005 Document Revised: 03/01/2012 Document Reviewed: 01/04/2012 [employer] Patient Information 2014 [employer]. Health Maintenance, Males A healthy lifestyle and preventative care can promote health and wellness.  Maintain regular health, dental, and eye exams.  Eat a healthy diet. Foods like vegetables, fruits, whole grains, low-fat dairy products, and lean protein foods contain the nutrients you need and are low in calories. Decrease your intake of foods high in solid fats, added sugars, and salt. Get information about a proper diet from your health care provider, if necessary.  Regular physical exercise is one of the most important things you can do for your health. Most adults should get at least 150 minutes of moderate-intensity exercise (any activity that increases your heart rate and causes you to sweat) each week. In addition, most adults need muscle-strengthening exercises on 2 or more days a week.   Maintain a healthy weight. The body mass index (BMI) is a screening tool to identify possible weight problems. It provides an estimate of body fat based on height and weight. Your health care provider can find your BMI and can help you achieve or maintain a healthy weight. For males 20 years and older:  A BMI below 18.5 is considered underweight.  A BMI of 18.5 to 24.9 is normal.  A BMI of 25 to 29.9 is considered overweight.  A BMI of 30 and above is considered obese.  Maintain normal blood lipids and cholesterol by exercising and minimizing your intake of saturated fat. Eat a balanced diet with plenty of fruits and vegetables. Blood tests for lipids and cholesterol should begin at age 20 and be repeated every 5 years. If your lipid or cholesterol levels are high, you are over 50, or you are at high risk for heart disease, you may need your cholesterol levels checked more frequently.Ongoing high lipid and cholesterol  levels should be treated with medicines, if diet and exercise are not working.  If you smoke, find out from your health care provider how to quit. If you do not use tobacco, do not start.  Lung cancer screening is recommended for adults aged 55 80 years who are at high risk for developing lung cancer because of a history of smoking. A yearly low-dose CT scan of the lungs is recommended for people who have at least a 30-pack-year history of smoking and are a current smoker or have quit within the past 15 years. A pack year of smoking is smoking an average of 1 pack of cigarettes a day for 1 year (for example, a 30-pack-year history of smoking could mean smoking 1 pack a day for 30 years or 2 packs a day   for 15 years). Yearly screening should continue until the smoker has stopped smoking for at least 15 years. Yearly screening should be stopped for people who develop a health problem that would prevent them from having lung cancer treatment.  If you choose to drink alcohol, do not have more than 2 drinks per day. One drink is considered to be 12 oz (360 mL) of beer, 5 oz (150 mL) of wine, or 1.5 oz (45 mL) of liquor.  Avoid use of street drugs. Do not share needles with anyone. Ask for help if you need support or instructions about stopping the use of drugs.  High blood pressure causes heart disease and increases the risk of stroke. Blood pressure should be checked at least every 1 2 years. Ongoing high blood pressure should be treated with medicines if weight loss and exercise are not effective.  If you are 45 51 years old, ask your health care provider if you should take aspirin to prevent heart disease.  Diabetes screening involves taking a blood sample to check your fasting blood sugar level. This should be done once every 3 years after age 45, if you are at a normal weight and without risk factors for diabetes. Testing should be considered at a younger age or be carried out more frequently if you  are overweight and have at least 1 risk factor for diabetes.  Colorectal cancer can be detected and often prevented. Most routine colorectal cancer screening begins at the age of 50 and continues through age 75. However, your health care provider may recommend screening at an earlier age if you have risk factors for colon cancer. On a yearly basis, your health care provider may provide home test kits to check for hidden blood in the stool. A small camera at the end of a tube may be used to directly examine the colon (sigmoidoscopy or colonoscopy) to detect the earliest forms of colorectal cancer. Talk to your health care provider about this at age 50, when routine screening begins. A direct exam of the colon should be repeated every 5 10 years through age 75, unless early forms of pre-cancerous polyps or small growths are found.  People who are at an increased risk for hepatitis B should be screened for this virus. You are considered at high risk for hepatitis B if:  You were born in a country where hepatitis B occurs often. Talk with your health care provider about which countries are considered high-risk.  Your parents were born in a high-risk country and you have not received a shot to protect against hepatitis B (hepatitis B vaccine).  You have HIV or AIDS.  You use needles to inject street drugs.  You live with, or have sex with, someone who has hepatitis B.  You are a man who has sex with other men (MSM).  You get hemodialysis treatment.  You take certain medicines for conditions like cancer, organ transplantation, and autoimmune conditions.  Hepatitis C blood testing is recommended for all people born from 1945 through 1965 and any individual with known risk factors for hepatitis C.  Healthy men should no longer receive prostate-specific antigen (PSA) blood tests as part of routine cancer screening. Talk to your health care provider about prostate cancer screening.  Testicular cancer  screening is not recommended for adolescents or adult males who have no symptoms. Screening includes self-exam, a health care provider exam, and other screening tests. Consult with your health care provider about any symptoms you have or   any concerns you have about testicular cancer.  Practice safe sex. Use condoms and avoid high-risk sexual practices to reduce the spread of sexually transmitted infections (STIs).  Use sunscreen. Apply sunscreen liberally and repeatedly throughout the day. You should seek shade when your shadow is shorter than you. Protect yourself by wearing long sleeves, pants, a wide-brimmed hat, and sunglasses year round, whenever you are outdoors.  Tell your health care provider of new moles or changes in moles, especially if there is a change in shape or color. Also tell your provider if a mole is larger than the size of a pencil eraser.  A one-time screening for abdominal aortic aneurysm (AAA) and surgical repair of large AAAs by ultrasound is recommended for men aged 65 75 years who are current or former smokers.  Stay current with your vaccines (immunizations). Document Released: 12/04/2007 Document Revised: 03/28/2013 Document Reviewed: 11/02/2010 [employer] Patient Information 2014 [employer].  

## 2013-08-21 NOTE — Progress Notes (Signed)
Subjective:    Patient ID: [name], male    DOB: 10-05-1960, 53 y.o.   MRN: [id number]  Diabetes He presents for his follow-up diabetic visit. He has type 2 diabetes mellitus. His disease course has been stable. There are no hypoglycemic associated symptoms. Pertinent negatives for diabetes include no blurred vision, no chest pain, no fatigue, no foot paresthesias, no foot ulcerations, no polydipsia, no polyphagia, no polyuria, no visual change, no weakness and no weight loss. There are no hypoglycemic complications. Symptoms are stable. Diabetic complications include heart disease. Current diabetic treatment includes oral agent (dual therapy). He is compliant with treatment most of the time. His weight is stable. He is following a generally healthy diet. Meal planning includes avoidance of concentrated sweets. He has not had a previous visit with a dietician. He never participates in exercise. There is no change in his home blood glucose trend. An ACE inhibitor/angiotensin II receptor blocker is being taken. He does not see a podiatrist.Eye exam is current.      Review of Systems  Constitutional: Negative.  Negative for fever, chills, weight loss, diaphoresis, appetite change and fatigue.  HENT: Negative.   Eyes: Negative.  Negative for blurred vision.  Respiratory: Negative.  Negative for cough, choking, chest tightness, shortness of breath and stridor.   Cardiovascular: Negative.  Negative for chest pain, palpitations and leg swelling.  Gastrointestinal: Negative.  Negative for nausea, abdominal pain, diarrhea, constipation and blood in stool.  Endocrine: Negative.  Negative for polydipsia, polyphagia and polyuria.  Genitourinary: Negative.  Negative for difficulty urinating.  Musculoskeletal: Negative.  Negative for arthralgias, myalgias, neck pain and neck stiffness.  Skin: Negative.   Allergic/Immunologic: Negative.   Neurological: Negative.  Negative for weakness.    Hematological: Negative.  Negative for adenopathy. Does not bruise/bleed easily.  Psychiatric/Behavioral: Negative.        Objective:   Physical Exam  Vitals reviewed. Constitutional: He is oriented to person, place, and time. He appears well-developed and well-nourished. No distress.  HENT:  Head: Normocephalic and atraumatic.  Mouth/Throat: Oropharynx is clear and moist. No oropharyngeal exudate.  Eyes: Conjunctivae are normal. Right eye exhibits no discharge. Left eye exhibits no discharge. No scleral icterus.  Neck: Normal range of motion. Neck supple. No JVD present. No tracheal deviation present. No thyromegaly present.  Cardiovascular: Normal rate, regular rhythm, normal heart sounds and intact distal pulses.  Exam reveals no gallop and no friction rub.   No murmur heard. Pulmonary/Chest: Effort normal and breath sounds normal. No stridor. No respiratory distress. He has no wheezes. He has no rales. He exhibits no tenderness.  Abdominal: Soft. Bowel sounds are normal. He exhibits no distension and no mass. There is no tenderness. There is no rebound and no guarding. Hernia confirmed negative in the right inguinal area and confirmed negative in the left inguinal area.  Genitourinary: Rectum normal, prostate normal, testes normal and penis normal. Rectal exam shows no external hemorrhoid, no internal hemorrhoid, no fissure, no mass, no tenderness and anal tone normal. Guaiac negative stool. Prostate is not enlarged and not tender. Right testis shows no mass, no swelling and no tenderness. Right testis is descended. Left testis shows no mass, no swelling and no tenderness. Left testis is descended. Circumcised. No penile erythema or penile tenderness. No discharge found.  Musculoskeletal: Normal range of motion. He exhibits no edema and no tenderness.  Lymphadenopathy:    He has no cervical adenopathy.       Right: No inguinal adenopathy  present.       Left: No inguinal adenopathy  present.  Neurological: He is oriented to person, place, and time.  Skin: Skin is warm and dry. No rash noted. He is not diaphoretic. No erythema. No pallor.  Psychiatric: He has a normal mood and affect. His behavior is normal. Judgment and thought content normal.     Lab Results  Component Value Date   WBC 10.1 04/25/2009   HGB 15.9 04/25/2009   HCT 45.9 04/25/2009   PLT 205 04/25/2009   GLUCOSE 205* 12/28/2012   CHOL 153 12/28/2012   TRIG 417.0 Triglyceride is over 400; calculations on Lipids are invalid.* 12/28/2012   HDL 34.30* 12/28/2012   LDLDIRECT 76.1 12/28/2012   LDLCALC 107 08/20/2012   ALT 34 08/20/2012   AST 20 08/20/2012   NA 139 12/28/2012   K 4.2 12/28/2012   CL 105 12/28/2012   CREATININE 0.8 12/28/2012   BUN 16 12/28/2012   CO2 28 12/28/2012   INR 0.9 ratio 04/22/2009   HGBA1C 7.6* 12/28/2012   MICROALBUR 6.1* 09/05/2012       Assessment & Plan:

## 2013-08-21 NOTE — Assessment & Plan Note (Signed)
Slight improvement noted He will work on his lifestyle modifications

## 2013-08-21 NOTE — Telephone Encounter (Signed)
Phoned and left voicemail message requested change had been completed.

## 2013-08-21 NOTE — Assessment & Plan Note (Signed)
Exam done Flu vax was given Labs ordered Pt ed material was given He was referred for a colonoscopy

## 2013-08-21 NOTE — Assessment & Plan Note (Signed)
His BP is well controlled 

## 2013-08-21 NOTE — Telephone Encounter (Signed)
Spouse phoned after patient's OV this morning & noticed that SMOKING was still as current on his history.  States that patient quit smoking over 2 years ago & requests that current smoker be removed, as it impacts insurance costs.  Please advise & I will change per patient request.  CB# [phone]

## 2013-08-21 NOTE — Progress Notes (Signed)
Pre visit review using our clinic review tool, if applicable. No additional management support is needed unless otherwise documented below in the visit note. 

## 2013-08-21 NOTE — Assessment & Plan Note (Addendum)
No s/s noted He has an appt soon with cardiology

## 2013-08-21 NOTE — Assessment & Plan Note (Signed)
Goal achieved 

## 2013-08-21 NOTE — Assessment & Plan Note (Signed)
His A1C shows that he has good control of his blood sugar

## 2013-08-21 NOTE — Telephone Encounter (Signed)
I changed it 

## 2013-09-07 ENCOUNTER — Other Ambulatory Visit: Payer: Self-pay | Admitting: Internal Medicine

## 2013-09-18 ENCOUNTER — Ambulatory Visit (INDEPENDENT_AMBULATORY_CARE_PROVIDER_SITE_OTHER): Payer: Managed Care, Other (non HMO) | Admitting: Cardiovascular Disease

## 2013-09-18 ENCOUNTER — Encounter: Payer: Self-pay | Admitting: Cardiovascular Disease

## 2013-09-18 VITALS — BP 128/82 | HR 65 | Ht 66.0 in | Wt 202.0 lb

## 2013-09-18 DIAGNOSIS — I2510 Atherosclerotic heart disease of native coronary artery without angina pectoris: Secondary | ICD-10-CM

## 2013-09-18 DIAGNOSIS — I10 Essential (primary) hypertension: Secondary | ICD-10-CM

## 2013-09-18 DIAGNOSIS — E785 Hyperlipidemia, unspecified: Secondary | ICD-10-CM

## 2013-09-18 NOTE — Progress Notes (Signed)
HPI:  53 year old gentleman presenting for followup evaluation. The patient has been followed for coronary artery disease and has undergone multiple PCI procedures. He has been previously followed by Dr. Riley Kill. Had recent labs with a cholesterol of 137, nitroglycerin 353, HDL 31, and LDL 35. Fasting glucose was 165. Diabetes has been managed by his primary physician. Hemoglobin A1c was 6.9. His most recent heart catheterization was performed in 2008. At that time he was found to have well-preserved left ventricular systolic function, continued patency of the stented segments in the right coronary artery and distal left circumflex, and nonobstructive disease. He has been managed medically.  The patient is doing well from a cardiac perspective. He denies chest pain, shortness of breath, edema, or palpitations. He quit smoking about 2 years ago. He has continued to struggle with weight loss measures.  Outpatient Encounter Prescriptions as of 09/18/2013  Medication Sig  . aspirin 81 MG tablet Take 81 mg by mouth daily.    . Cholecalciferol 50000 UNITS capsule Take 1 capsule (50,000 Units total) by mouth once a week.  . clopidogrel (PLAVIX) 75 MG tablet Take 1 tablet (75 mg total) by mouth daily.  . CRESTOR 40 MG tablet TAKE 1 TABLET BY MOUTH DAILY  . metoprolol tartrate (LOPRESSOR) 25 MG tablet TAKE 1 TABLET BY MOUTH 2 TIMES DAILY.  . nitroGLYCERIN (NITROSTAT) 0.4 MG SL tablet Place 0.4 mg under the tongue every 5 (five) minutes as needed.    Marland Kitchen olmesartan (BENICAR) 20 MG tablet Take 1 tablet (20 mg total) by mouth daily.  Marland Kitchen omega-3 acid ethyl esters (LOVAZA) 1 G capsule Take 2 capsules (2 g total) by mouth 2 (two) times daily.  . sitaGLIPtan-metformin (JANUMET) 50-500 MG per tablet Take 1 tablet by mouth 2 (two) times daily with a meal.  . [DISCONTINUED] omega-3 acid ethyl esters (LOVAZA) 1 G capsule TAKE 2 CAPSULES BY MOUTH TWICE A DAY    Allergies  Allergen Reactions  . Heparin     Low bp      Past Medical History  Diagnosis Date  . Coronary artery disease     s/p BMS to RCA and CFX. s/p DES to RCA 2/2 ISR 2010  . Hypertension   . Hyperlipidemia   . Old myocardial infarction   . Paroxysmal ventricular tachycardia   . Asthma   . Diabetes mellitus without complication     ROS: Negative except as per HPI  BP 128/82  Pulse 65  Ht 5\' 6"  (1.676 m)  Wt 202 lb (91.627 kg)  BMI 32.62 kg/m2  PHYSICAL EXAM: Pt is alert and oriented, pleasant overweight gentleman in NAD HEENT: normal Neck: JVP - normal, carotids 2+= without bruits Lungs: CTA bilaterally CV: RRR without murmur or gallop Abd: soft, NT, Positive BS, no hepatomegaly Ext: no C/C/E, distal pulses intact and equal Skin: warm/dry no rash  EKG:  Normal sinus rhythm with occasional PVCs, within normal limits.  ASSESSMENT AND PLAN: 1. Coronary artery disease, native vessel. The patient is stable without symptoms of angina. He's undergone multiple PCI procedures in remains on long-term dual antiplatelet therapy at low bleeding risk. His medical program was reviewed and it includes Crestor and metoprolol. He also is on an ARB in the setting of type 2 diabetes and CAD. Will continue his medications without changes.  2. Hypertension. Blood pressure is well controlled on his current medical therapy.  3. Hyperlipidemia. Lipids reviewed with the patient. His total cholesterol of 137, HDL 31, and LDL 35.  His triglycerides are high at 353. He will continue on Crestor. We discussed the importance of weight loss and dietary changes. We specifically discussed a low carbohydrate diet. I think this would be helpful in the setting of his diabetes and hypertriglyceridemia.  For followup I will see him back in one year.  Tonny BollmanMichael Kalem Rockwell 09/18/2013 6:34 PM

## 2013-09-18 NOTE — Patient Instructions (Signed)
Your physician recommends that you continue on your current medications as directed. Please refer to the Current Medication list given to you today.  Your physician wants you to follow-up in: 1 year with Dr. Cooper.  You will receive a reminder letter in the mail two months in advance. If you don't receive a letter, please call our office to schedule the follow-up appointment.   

## 2013-09-22 ENCOUNTER — Other Ambulatory Visit: Payer: Self-pay | Admitting: Internal Medicine

## 2013-10-01 ENCOUNTER — Encounter: Payer: Self-pay | Admitting: Internal Medicine

## 2013-10-26 ENCOUNTER — Other Ambulatory Visit: Payer: Self-pay | Admitting: Cardiovascular Disease

## 2013-11-25 ENCOUNTER — Other Ambulatory Visit: Payer: Self-pay | Admitting: Internal Medicine

## 2013-12-21 ENCOUNTER — Other Ambulatory Visit: Payer: Self-pay | Admitting: Cardiovascular Disease

## 2014-01-16 ENCOUNTER — Other Ambulatory Visit: Payer: Self-pay | Admitting: Internal Medicine

## 2014-01-16 ENCOUNTER — Telehealth: Payer: Self-pay | Admitting: *Deleted

## 2014-01-16 NOTE — Telephone Encounter (Signed)
Wife wanting to get samples for husband janumet 50/500. Called wife back no answer LMOM will leave some for pick-up, but did inform of the new sample policy...Raechel Chute/lmb

## 2014-03-10 ENCOUNTER — Other Ambulatory Visit: Payer: Self-pay | Admitting: Cardiovascular Disease

## 2014-04-23 ENCOUNTER — Telehealth: Payer: Self-pay | Admitting: *Deleted

## 2014-04-23 DIAGNOSIS — E1165 Type 2 diabetes mellitus with hyperglycemia: Secondary | ICD-10-CM

## 2014-04-23 DIAGNOSIS — IMO0002 Reserved for concepts with insufficient information to code with codable children: Secondary | ICD-10-CM

## 2014-04-23 MED ORDER — SITAGLIPTIN PHOS-METFORMIN HCL 50-500 MG PO TABS: 1.0000 | ORAL_TABLET | Freq: Two times a day (BID) | ORAL | Status: DC

## 2014-04-23 NOTE — Telephone Encounter (Signed)
Left msg on triage stating needing rx for husband janumet sent to his pharmacy. Tried calling wife back someone keep picking phone up bbut not saying anything. Janumet sent to pharmacy...Raechel Chute/lmb

## 2014-05-25 ENCOUNTER — Other Ambulatory Visit: Payer: Self-pay | Admitting: Internal Medicine

## 2014-06-11 ENCOUNTER — Other Ambulatory Visit: Payer: Self-pay | Admitting: Cardiovascular Disease

## 2014-06-26 ENCOUNTER — Other Ambulatory Visit: Payer: Self-pay | Admitting: Cardiovascular Disease

## 2014-07-17 ENCOUNTER — Other Ambulatory Visit: Payer: Self-pay | Admitting: Cardiovascular Disease

## 2014-08-08 ENCOUNTER — Telehealth: Payer: Self-pay | Admitting: Cardiovascular Disease

## 2014-08-08 NOTE — Telephone Encounter (Signed)
New Msg       Pt wants to have extensive dental procedures completed. Pt has been taking 8-12 ibuprofen daily for about two months now.   Is this going to affect him having oral surgery?  Does he need to stop any medications before getting extractions?  Please call pt wife and advise. After 1 pm wife will be at work and can't be reached anymore today.

## 2014-08-08 NOTE — Telephone Encounter (Signed)
I spoke with the pt's wife and she said the pt has been in terrible pain with his teeth for two months and he is taking 8-12 ibuprofen per day due to pain.  She states his teeth are "rotten" and all need to be pulled. She has been trying to get the pt scheduled with a dentist but has had difficulty finding a dentist who would take the pt's insurance.  She was able to get an appointment on 08/14/14 with Dr Sharlee Blewonald Roe. She wanted to know if the dentist can pull some of his teeth at his appointment next week.  I made her aware that would have to be determined by the dentist after evaluation. The pt is taking ASA and Plavix and I made her aware that most dentist will perform a simple dental extraction with the pt on these medications. If the pt requires complex dental procedures then he may get referred to an oral surgeon and then a request would be needed for cardiac clearance and approval to hold medications. I advised her that Dr Excell Seltzerooper does not prescribe pain medications and that she could contact the PCP to see if they will prescribe a short term course of pain medication. I also made her aware that taking 8-12 Ibuprofen per day for 2 months can cause other issues with bleeding and increased risk of cardiac events. She plans to call the PCP to see if they can give him medications for pain.

## 2014-08-16 ENCOUNTER — Telehealth: Payer: Self-pay | Admitting: Cardiovascular Disease

## 2014-08-16 NOTE — Telephone Encounter (Signed)
New message     Request for surgical clearance:  1. What type of surgery is being performed? Need 25 teeth pulled at one time---pt has infection in gums and is on an antibiotic. Pt is in pain  2. When is this surgery scheduled?   3. Are there any medications that need to be held prior to surgery and how long? plavix and aspirin.  Do Dr Excell Seltzerooper think he can have 25 teeth pulled without being put to sleep  4. Name of physician performing surgery? No doctor yet  5. What is your office phone and fax number?

## 2014-08-16 NOTE — Telephone Encounter (Signed)
Notified wife that Dr. Excell Seltzerooper advises for him to stop Plavix and ASA 5 days before extraction.  She states she has called Dr. Yetta BarreJones office and they can see him Tues March 1.  She also called the dentist back to get something for the pain but he is not in office today and nurse can't call  pain medication into pharmacy.  She advised for him to take the Ibuprofen and ES Tylenol.  She states he has been taking 2 Ibuprofen every 3-4 hrs and is not "touching" the pain.  Advised her she may want to take him to an Urgent Care to see if they would give him something for pain until seen by Dr. Yetta BarreJones on Tuesday. She understands but is just frustrated at this point.

## 2014-08-16 NOTE — Telephone Encounter (Signed)
He should be off plavix and ASA 5 days before having major dental extraction and should start back when safe from bleeding perspective (probably 48-72 hours afterwards). I'm really not sure about the insurance part of this.

## 2014-08-16 NOTE — Telephone Encounter (Signed)
Wife calling stating husband has to have 25 teeth removed.  The oral surgeon that he saw said he has a severe gum infection and needs all 25 teeth removed.  The only place that will remove all 25 teeth without putting him  to sleep is [facility] or [facility]. She has a $1500 dental plan and really can't afford the cost.  (1) she is asking if he would be physically able to undergo removal of 25 teeth and not be put to sleep; (2) could he be admitted for medical reason so the insurance will pay and (3) how long does he need to be off Plavix and Asa.  Advised that she would need to call Dr. Sanda Lingerhomas Jones his PCP regarding his being admitted.  She states he has been on Amoxicillin for 12 days.  At this point he can't eat or drink and last 2 days has been very nauseated. She almost is at the point of taking him to ER due to the severe infection and nausea. Advised would forward message to Dr. Excell Seltzerooper for his recommendations.

## 2014-08-20 ENCOUNTER — Other Ambulatory Visit (INDEPENDENT_AMBULATORY_CARE_PROVIDER_SITE_OTHER): Payer: Managed Care, Other (non HMO)

## 2014-08-20 ENCOUNTER — Encounter: Payer: Self-pay | Admitting: Internal Medicine

## 2014-08-20 ENCOUNTER — Ambulatory Visit (INDEPENDENT_AMBULATORY_CARE_PROVIDER_SITE_OTHER): Payer: Managed Care, Other (non HMO) | Admitting: Internal Medicine

## 2014-08-20 VITALS — BP 122/80 | HR 79 | Temp 97.5°F | Resp 18 | Wt 204.0 lb

## 2014-08-20 DIAGNOSIS — E118 Type 2 diabetes mellitus with unspecified complications: Secondary | ICD-10-CM

## 2014-08-20 DIAGNOSIS — Z23 Encounter for immunization: Secondary | ICD-10-CM

## 2014-08-20 DIAGNOSIS — E781 Pure hyperglyceridemia: Secondary | ICD-10-CM

## 2014-08-20 DIAGNOSIS — K047 Periapical abscess without sinus: Secondary | ICD-10-CM

## 2014-08-20 DIAGNOSIS — I10 Essential (primary) hypertension: Secondary | ICD-10-CM

## 2014-08-20 DIAGNOSIS — E559 Vitamin D deficiency, unspecified: Secondary | ICD-10-CM

## 2014-08-20 DIAGNOSIS — E780 Pure hypercholesterolemia: Secondary | ICD-10-CM

## 2014-08-20 LAB — COMPREHENSIVE METABOLIC PANEL
ALK PHOS: 42 U/L (ref 39–117)
ALT: 24 U/L (ref 0–53)
AST: 15 U/L (ref 0–37)
Albumin: 4.8 g/dL (ref 3.5–5.2)
BILIRUBIN TOTAL: 0.5 mg/dL (ref 0.2–1.2)
BUN: 18 mg/dL (ref 6–23)
CO2: 29 mEq/L (ref 19–32)
CREATININE: 0.8 mg/dL (ref 0.40–1.50)
Calcium: 9.9 mg/dL (ref 8.4–10.5)
Chloride: 102 mEq/L (ref 96–112)
GFR: 108.21 mL/min (ref >60.00)
Glucose, Bld: 176 mg/dL — ABNORMAL HIGH (ref 70–99)
Potassium: 3.7 mEq/L (ref 3.5–5.1)
Sodium: 139 mEq/L (ref 135–145)
Total Protein: 8 g/dL (ref 6.0–8.3)

## 2014-08-20 LAB — CBC WITH DIFFERENTIAL/PLATELET
BASOS PCT: 0.6 % (ref 0.0–3.0)
Basophils Absolute: 0 10*3/uL (ref 0.0–0.1)
EOS ABS: 0.2 10*3/uL (ref 0.0–0.7)
EOS PCT: 3.1 % (ref 0.0–5.0)
HEMATOCRIT: 42.9 % (ref 39.0–52.0)
Hemoglobin: 14.6 g/dL (ref 13.0–17.0)
Lymphocytes Relative: 36.1 % (ref 12.0–46.0)
Lymphs Abs: 2.5 10*3/uL (ref 0.7–4.0)
MCHC: 34.1 g/dL (ref 30.0–36.0)
MCV: 79.5 fl (ref 78.0–100.0)
MONO ABS: 0.5 10*3/uL (ref 0.1–1.0)
Monocytes Relative: 7.3 % (ref 3.0–12.0)
NEUTROS ABS: 3.7 10*3/uL (ref 1.4–7.7)
Neutrophils Relative %: 52.9 % (ref 43.0–77.0)
Platelets: 223 10*3/uL (ref 150.0–400.0)
RBC: 5.39 Mil/uL (ref 4.22–5.81)
RDW: 13.3 % (ref 11.5–15.5)
WBC: 6.9 10*3/uL (ref 4.0–10.5)

## 2014-08-20 LAB — MICROALBUMIN / CREATININE URINE RATIO
Creatinine,U: 104.8 mg/dL
MICROALB UR: 19 mg/dL — AB (ref 0.0–1.9)
Microalb Creat Ratio: 18.1 mg/g (ref 0.0–30.0)

## 2014-08-20 LAB — URINALYSIS, ROUTINE W REFLEX MICROSCOPIC
Bilirubin Urine: NEGATIVE
Hgb urine dipstick: NEGATIVE
KETONES UR: NEGATIVE
LEUKOCYTES UA: NEGATIVE
Nitrite: NEGATIVE
PH: 6 (ref 5.0–8.0)
SPECIFIC GRAVITY, URINE: 1.015 (ref 1.000–1.030)
TOTAL PROTEIN, URINE-UPE24: 30 — AB
Urine Glucose: NEGATIVE
Urobilinogen, UA: 0.2 (ref 0.0–1.0)

## 2014-08-20 LAB — LIPID PANEL
CHOL/HDL RATIO: 4
CHOLESTEROL: 127 mg/dL (ref 0–200)
HDL: 30 mg/dL — AB (ref >39.00)
NONHDL: 97
TRIGLYCERIDES: 284 mg/dL — AB (ref 0.0–149.0)
VLDL: 56.8 mg/dL — AB (ref 0.0–40.0)

## 2014-08-20 LAB — VITAMIN D 25 HYDROXY (VIT D DEFICIENCY, FRACTURES): VITD: 36.13 ng/mL (ref 30.00–100.00)

## 2014-08-20 LAB — TSH: TSH: 1.87 u[IU]/mL (ref 0.35–4.50)

## 2014-08-20 LAB — LDL CHOLESTEROL, DIRECT: Direct LDL: 62 mg/dL

## 2014-08-20 LAB — HEMOGLOBIN A1C: HEMOGLOBIN A1C: 6.9 % — AB (ref 4.6–6.5)

## 2014-08-20 MED ORDER — OXYCODONE-ACETAMINOPHEN 7.5-325 MG PO TABS: 1.0000 | ORAL_TABLET | ORAL | Status: DC | PRN

## 2014-08-20 NOTE — Patient Instructions (Signed)

## 2014-08-20 NOTE — Progress Notes (Signed)
Pre visit review using our clinic review tool, if applicable. No additional management support is needed unless otherwise documented below in the visit note. 

## 2014-08-20 NOTE — Progress Notes (Signed)
Subjective:    Patient ID: [name], male    DOB: [birth year], 54 y.o.   MRN: [id number]  Diabetes He presents for his follow-up diabetic visit. He has type 2 diabetes mellitus. His disease course has been stable. There are no hypoglycemic associated symptoms. Pertinent negatives for hypoglycemia include no dizziness. Pertinent negatives for diabetes include no blurred vision, no chest pain, no fatigue, no foot paresthesias, no foot ulcerations, no polydipsia, no polyphagia, no polyuria, no visual change, no weakness and no weight loss. There are no hypoglycemic complications. There are no diabetic complications. Current diabetic treatment includes oral agent (dual therapy). He is compliant with treatment some of the time. His weight is stable. He is following a generally unhealthy diet. When asked about meal planning, he reported none. He never participates in exercise. There is no change in his home blood glucose trend. An ACE inhibitor/angiotensin II receptor blocker is being taken. He does not see a podiatrist.Eye exam is not current.      Review of Systems  Constitutional: Negative for fever, chills, weight loss, diaphoresis, appetite change and fatigue.  HENT: Positive for dental problem and facial swelling. Negative for postnasal drip, rhinorrhea, sinus pressure, sore throat, trouble swallowing and voice change.        He has been seeing a dentist about multiple painful and infected teeth, he has tried tylenol for the pain but is still in a lot of pain, he is also on amoxil and feels like the facial swelling is better but he requests a referral to oral surgery.  Eyes: Negative.  Negative for blurred vision.  Respiratory: Negative.  Negative for cough, choking, chest tightness, shortness of breath and stridor.   Cardiovascular: Negative.  Negative for chest pain, palpitations and leg swelling.  Gastrointestinal: Negative.  Negative for nausea, vomiting, abdominal pain, diarrhea,  constipation and blood in stool.  Endocrine: Negative.  Negative for polydipsia, polyphagia and polyuria.  Genitourinary: Negative.   Musculoskeletal: Negative.   Skin: Negative.  Negative for rash.  Allergic/Immunologic: Negative.   Neurological: Negative.  Negative for dizziness and weakness.  Hematological: Negative.  Negative for adenopathy. Does not bruise/bleed easily.  Psychiatric/Behavioral: Negative.        Objective:   Physical Exam  Constitutional: He is oriented to person, place, and time. He appears well-developed and well-nourished.  Non-toxic appearance. He does not have a sickly appearance. He does not appear ill. No distress.  HENT:  Head: Normocephalic and atraumatic.  Mouth/Throat: Uvula is midline and mucous membranes are normal. Mucous membranes are not pale, not dry and not cyanotic. He does not have dentures. No oral lesions. No trismus in the jaw. Abnormal dentition. Dental caries present. No dental abscesses, uvula swelling or lacerations. No oropharyngeal exudate, posterior oropharyngeal edema, posterior oropharyngeal erythema or tonsillar abscesses.  I don't see any facial swelling today  Eyes: Conjunctivae are normal. Right eye exhibits no discharge. Left eye exhibits no discharge. No scleral icterus.  Neck: Normal range of motion. Neck supple. No JVD present. No tracheal deviation present. No thyromegaly present.  Cardiovascular: Normal rate, regular rhythm, normal heart sounds and intact distal pulses.  Exam reveals no gallop and no friction rub.   No murmur heard. Pulmonary/Chest: Effort normal and breath sounds normal. No stridor. No respiratory distress. He has no wheezes. He has no rales. He exhibits no tenderness.  Abdominal: Soft. Bowel sounds are normal. He exhibits no distension and no mass. There is no tenderness. There is no rebound and no  guarding.  Musculoskeletal: Normal range of motion. He exhibits no edema or tenderness.  Lymphadenopathy:    He  has no cervical adenopathy.  Neurological: He is oriented to person, place, and time.  Skin: Skin is warm and dry. No rash noted. He is not diaphoretic. No erythema. No pallor.          Assessment & Plan:

## 2014-08-20 NOTE — Assessment & Plan Note (Signed)
I will recheck his A1C and will monitor his renal function 

## 2014-08-20 NOTE — Assessment & Plan Note (Signed)
Refer to oral surgery Cont amoxil Try percocet for pain

## 2014-08-20 NOTE — Assessment & Plan Note (Signed)
I will recheck his Vit D level today to see if he has been compliant Will treat if indicated

## 2014-08-20 NOTE — Assessment & Plan Note (Signed)
He is due for a lipid panel Will treat his trigs if > 500

## 2014-08-22 ENCOUNTER — Telehealth: Payer: Self-pay | Admitting: Internal Medicine

## 2014-08-22 NOTE — Telephone Encounter (Signed)
Letter written

## 2014-08-22 NOTE — Telephone Encounter (Signed)
Pt wife called and needs a medical release for him to have all his teeth pulled at one time.  This is though [facility]    Pt would like to pick this up.  He was just seen on tues the 1st.  If he has any other question she said to call her at [phone]

## 2014-08-25 ENCOUNTER — Other Ambulatory Visit: Payer: Self-pay | Admitting: Internal Medicine

## 2014-08-26 NOTE — Telephone Encounter (Signed)
Called patient twice//line busy. Letter placed upfront

## 2014-09-02 ENCOUNTER — Other Ambulatory Visit: Payer: Self-pay | Admitting: Internal Medicine

## 2014-09-11 ENCOUNTER — Other Ambulatory Visit: Payer: Self-pay | Admitting: Cardiovascular Disease

## 2014-09-16 IMAGING — CR DG LUMBAR SPINE COMPLETE 4+V
5 series · 5 of 5 positions shown · non-contrast
Comparison: CT 07/16/2005.

CLINICAL DATA: Low back pain and right hip pain.  No known injury.

LUMBAR SPINE - COMPLETE 4+ VIEW

[view not recorded (1 of 5)]
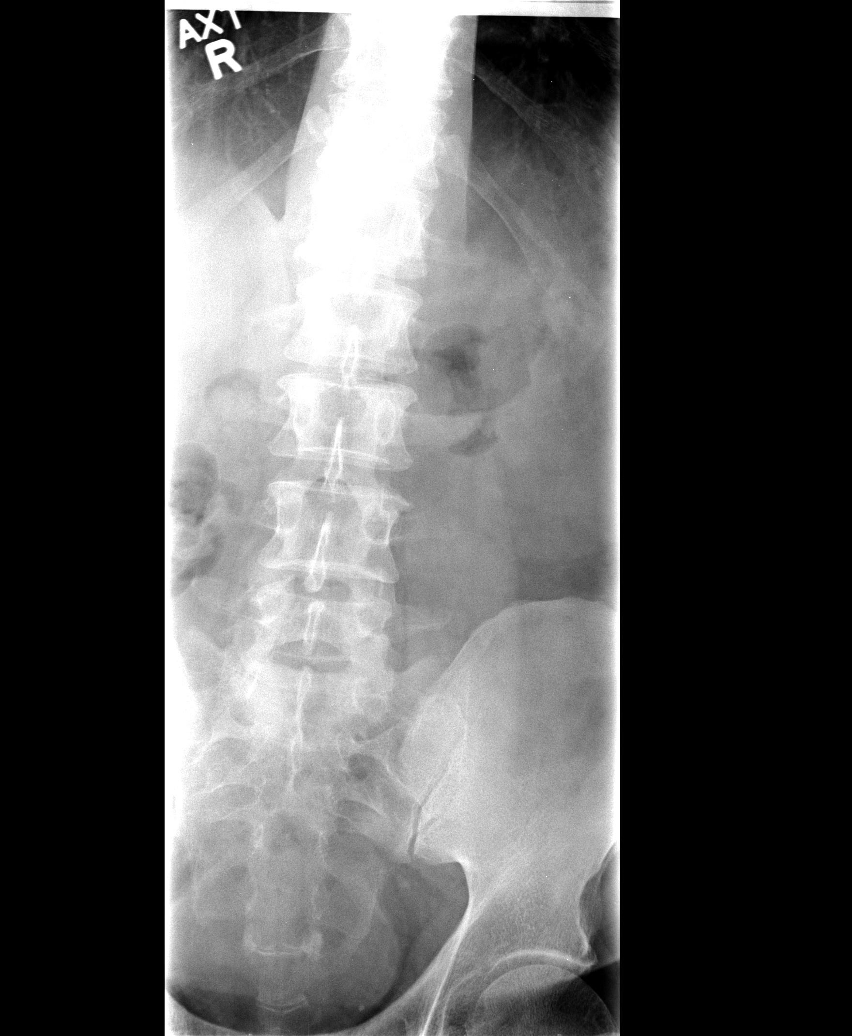

[view not recorded (2 of 5)]
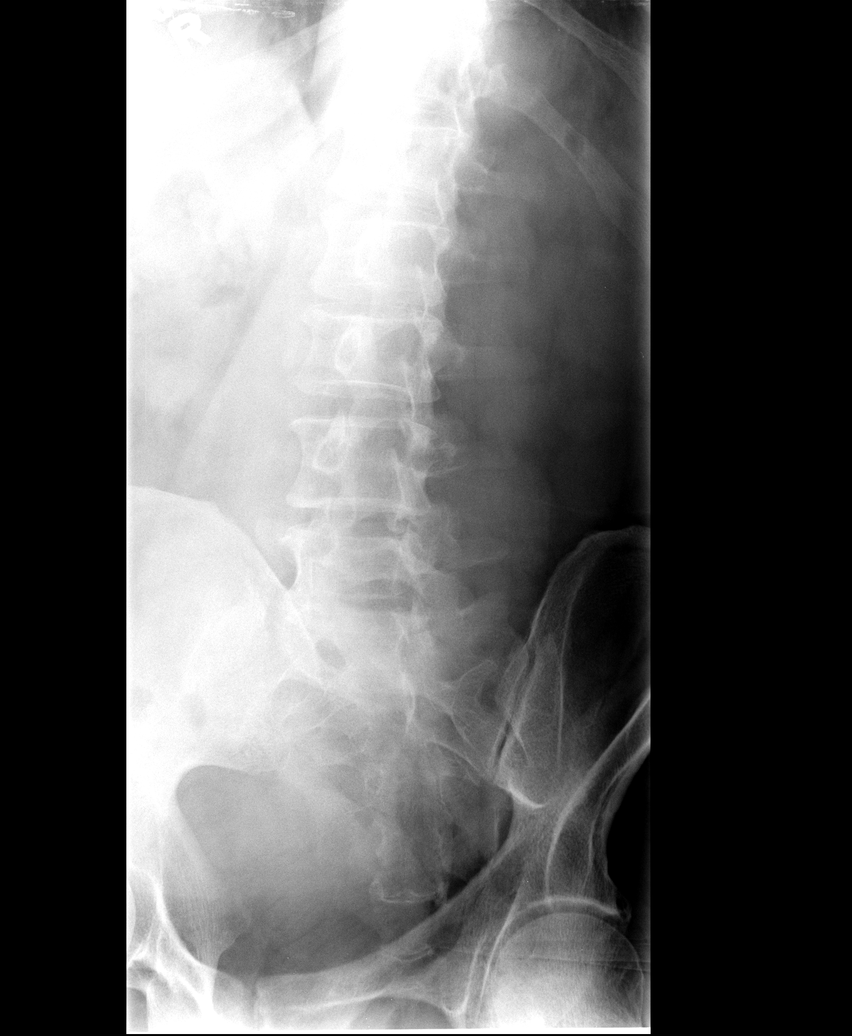

[view not recorded (3 of 5)]
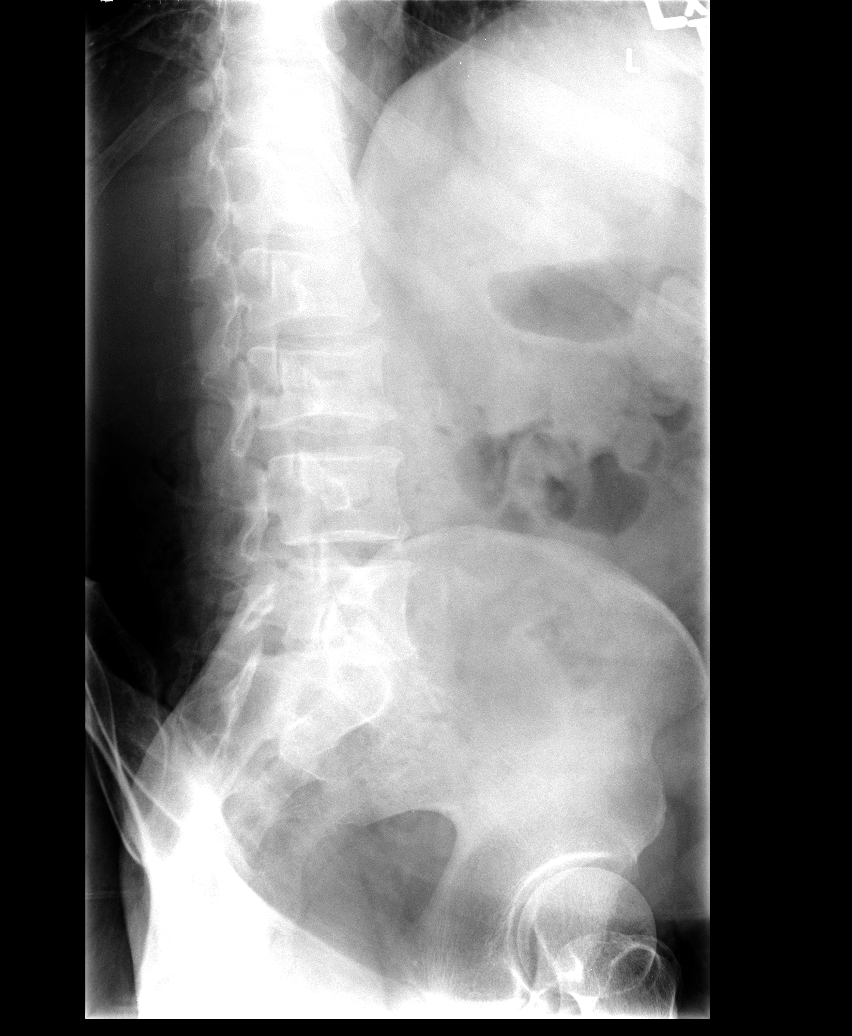

[view not recorded (4 of 5)]
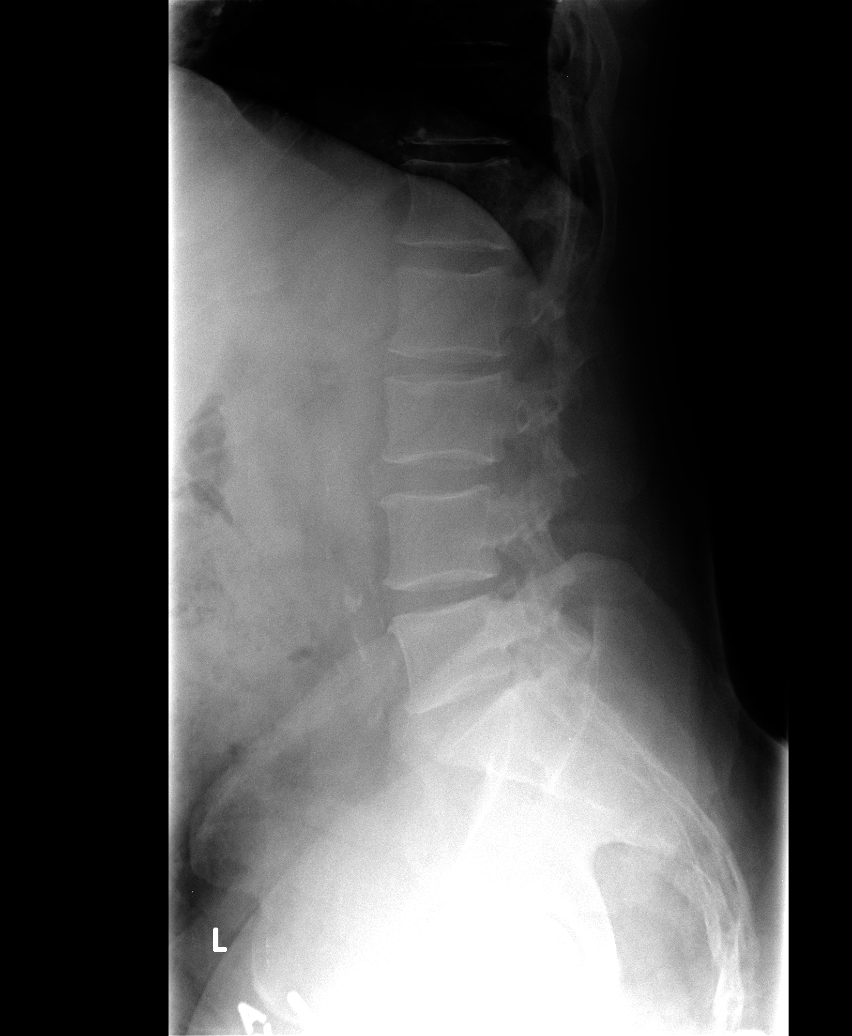

[view not recorded (5 of 5)]
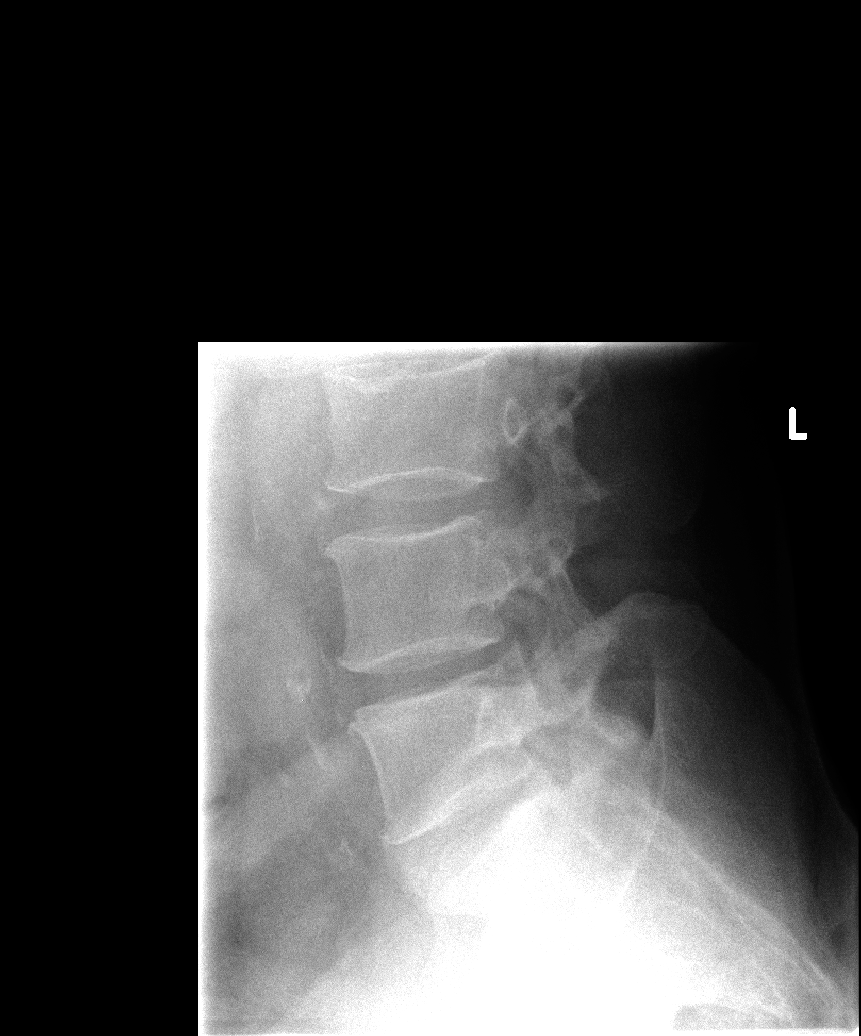

[5 of 5 positions shown; findings below may reference images not displayed]

FINDINGS: There are five non-rib bearing lumbar-type vertebral
bodies.  Intervertebral disc spaces are relatively well maintained.
Alignment is normal.  No fracture, bony destruction, or pars defect
is seen.  SI joints appear intact.
There are minimal early degenerative spondylosis changes are seen.
Minimal nonaneurysmal arterial calcification is present.
IMPRESSION: Minimal early degenerative spondylosis findings.  Minimal
nonaneurysmal arterial calcification

## 2014-09-30 ENCOUNTER — Encounter: Payer: Self-pay | Admitting: Cardiovascular Disease

## 2014-09-30 ENCOUNTER — Ambulatory Visit (INDEPENDENT_AMBULATORY_CARE_PROVIDER_SITE_OTHER): Payer: Managed Care, Other (non HMO) | Admitting: Cardiovascular Disease

## 2014-09-30 VITALS — BP 124/90 | HR 74 | Ht 66.0 in | Wt 197.1 lb

## 2014-09-30 DIAGNOSIS — E781 Pure hyperglyceridemia: Secondary | ICD-10-CM | POA: Diagnosis not present

## 2014-09-30 DIAGNOSIS — I10 Essential (primary) hypertension: Secondary | ICD-10-CM | POA: Diagnosis not present

## 2014-09-30 MED ORDER — ROSUVASTATIN CALCIUM 40 MG PO TABS: 40.0000 mg | ORAL_TABLET | Freq: Every day | ORAL | Status: DC

## 2014-09-30 MED ORDER — METOPROLOL TARTRATE 25 MG PO TABS: 25.0000 mg | ORAL_TABLET | Freq: Two times a day (BID) | ORAL | Status: DC

## 2014-09-30 MED ORDER — NITROGLYCERIN 0.4 MG SL SUBL: 0.4000 mg | SUBLINGUAL_TABLET | SUBLINGUAL | Status: DC | PRN

## 2014-09-30 NOTE — Patient Instructions (Signed)
Your physician wants you to follow-up in: 1 YEAR with Dr Cooper.  You will receive a reminder letter in the mail two months in advance. If you don't receive a letter, please call our office to schedule the follow-up appointment.  Your physician recommends that you continue on your current medications as directed. Please refer to the Current Medication list given to you today.  

## 2014-09-30 NOTE — Progress Notes (Signed)
Cardiology Office Note   Date:  09/30/2014   ID:  [name], DOB 1961/04/18, MRN [id number]  PCP:  Sanda Lingerhomas Jones, MD  Cardiologist:  Tonny BollmanMichael Reisha Wos, MD    Chief Complaint  Patient presents with  . Appointment    ROV     History of Present Illness: [name] is a 54 y.o. male who presents for follow-up of coronary artery disease. The patient has had multiple PCI procedures. Other comorbid medical conditions include hypertension, hyperlipidemia, and tied 2 diabetes. His most recent heart catheterization in 2008 demonstrated normal LV systolic function, continued patency of the stented segments in the RCA and distal left circumflex, and nonobstructive disease elsewhere. He has been managed medically since that time.  The patient is doing well. He denies chest pain, chest pressure, shortness of breath, edema, or heart palpitations. He's had no lightheadedness or syncope. He has been exercising on a bicycle. He rides bikes with his 52 year old grandson. He has no exertional symptoms. The patient did have problems with his teeth. He recently had all of his teeth pulled. He was off of dual antiplatelet therapy for this, but now is back on his long-term aspirin and clopidogrel.   Past Medical History  Diagnosis Date  . Coronary artery disease     s/p BMS to RCA and CFX. s/p DES to RCA 2/2 ISR 2010  . Hypertension   . Hyperlipidemia   . Old myocardial infarction   . Paroxysmal ventricular tachycardia   . Asthma   . Diabetes mellitus without complication     Past Surgical History  Procedure Laterality Date  . Coronary stent placement  2002, 2007, 2010    right    Current Outpatient Prescriptions  Medication Sig Dispense Refill  . aspirin 81 MG tablet Take 81 mg by mouth daily.      Marland Kitchen. BENICAR 20 MG tablet TAKE 1 TABLET BY MOUTH ONCE A DAY 90 tablet 3  . clopidogrel (PLAVIX) 75 MG tablet TAKE 1 TABLET BY MOUTH EVERY DAY 30 tablet 5  . D3-50 50000 UNITS capsule TAKE 1  CAPSULE (50,000 UNITS TOTAL) BY MOUTH ONCE A WEEK. 12 capsule 3  . metoprolol tartrate (LOPRESSOR) 25 MG tablet Take 1 tablet (25 mg total) by mouth 2 (two) times daily. 180 tablet 3  . nitroGLYCERIN (NITROSTAT) 0.4 MG SL tablet Place 1 tablet (0.4 mg total) under the tongue every 5 (five) minutes as needed for chest pain. 25 tablet 2  . omega-3 acid ethyl esters (LOVAZA) 1 G capsule Take 2 capsules (2 g total) by mouth 2 (two) times daily. 120 capsule 11  . rosuvastatin (CRESTOR) 40 MG tablet Take 1 tablet (40 mg total) by mouth daily. 90 tablet 3  . sitaGLIPtin-metformin (JANUMET) 50-500 MG per tablet Take 1 tablet by mouth 2 (two) times daily with a meal. 180 tablet 1   No current facility-administered medications for this visit.    Allergies:   Heparin   Social History:  The patient  reports that he quit smoking about 3 years ago. His smoking use included Cigarettes. He has a 45 pack-year smoking history. He has never used smokeless tobacco. He reports that he does not drink alcohol or use illicit drugs.   Family History:  The patient's  family history includes Diabetes in his mother and sister; Heart disease in his mother. There is no history of Cancer, Alcohol abuse, Early death, Hyperlipidemia, Kidney disease, Hypertension, or Stroke.    ROS:  Please see the history of  present illness.  All other systems are reviewed and negative.   PHYSICAL EXAM: VS:  BP 124/90 mmHg  Pulse 74  Ht  (1.676 m)  Wt 197 lb 1.9 oz (89.413 kg)  BMI 31.83 kg/m2 , BMI Body mass index is 31.83 kg/(m^2). GEN: Well nourished, well developed, in no acute distress HEENT: normal Neck: no JVD, no masses. No carotid bruits Cardiac: RRR without murmur or gallop                Respiratory:  clear to auscultation bilaterally, normal work of breathing GI: soft, nontender, nondistended, + BS MS: no deformity or atrophy Ext: no pretibial edema, pedal pulses 2+= bilaterally Skin: warm and dry, no rash Neuro:   Strength and sensation are intact Psych: euthymic mood, full affect  EKG:  EKG is ordered today. The ekg ordered today shows normal sinus rhythm with frequent PVCs, heart rate 74 bpm, nonspecific ST abnormality.  Recent Labs: 08/20/2014: ALT 24; BUN 18; Creatinine 0.80; Hemoglobin 14.6; Platelets 223.0; Potassium 3.7; Sodium 139; TSH 1.87   Lipid Panel     Component Value Date/Time   CHOL 127 08/20/2014 0903   CHOL 219 08/20/2012   TRIG 284.0* 08/20/2014 0903   TRIG 402 08/20/2012   HDL 30.00* 08/20/2014 0903   CHOLHDL 4 08/20/2014 0903   VLDL 56.8* 08/20/2014 0903   LDLCALC 35 08/21/2013 0850   LDLCALC 107 08/20/2012   LDLDIRECT 62.0 08/20/2014 0903   LDLDIRECT 129 08/20/2012      Wt Readings from Last 3 Encounters:  09/30/14 197 lb 1.9 oz (89.413 kg)  08/20/14 204 lb (92.534 kg)  09/18/13 202 lb (91.627 kg)    ASSESSMENT AND PLAN: 1.  CAD, native vessel: the patient is stable without symptoms of angina. His medical program was reviewed and will be continued without changes. I will see him back in one year for follow-up.  2. Essential hypertension: blood pressure is controlled on a combination of metoprolol and olmesartan. He is on appropriate disease specific Rx in the setting of CAD and diabetes.  3. Hyperlipidemia: lipids reviewed as above. He continues on Crestor. Reviewed the importance of lifestyle modification for treatment of his hypertriglyceridemia.  4. Type 2 diabetes: Recent hemoglobin A1c 6.9. Managed by Dr. Yetta Barre. Reviewed the importance of lifestyle modification/weight management with respect to his diabetes control.   Current medicines are reviewed with the patient today.  The patient does not have concerns regarding medicines.  The following changes have been made:  no change  Labs/ tests ordered today include:   Orders Placed This Encounter  Procedures  . EKG 12-Lead    Disposition:   FU one year  Signed, Tonny Bollman, MD  09/30/2014 10:53 AM     [facility] [address] [place], [place], [place]  [address] Phone: [phone]; Fax: [phone]

## 2014-10-09 ENCOUNTER — Other Ambulatory Visit: Payer: Self-pay | Admitting: Internal Medicine

## 2015-01-30 ENCOUNTER — Other Ambulatory Visit: Payer: Self-pay

## 2015-01-30 DIAGNOSIS — IMO0002 Reserved for concepts with insufficient information to code with codable children: Secondary | ICD-10-CM

## 2015-01-30 DIAGNOSIS — E1165 Type 2 diabetes mellitus with hyperglycemia: Secondary | ICD-10-CM

## 2015-01-30 MED ORDER — SITAGLIPTIN PHOS-METFORMIN HCL 50-500 MG PO TABS: 1.0000 | ORAL_TABLET | Freq: Two times a day (BID) | ORAL | Status: DC

## 2015-02-13 ENCOUNTER — Other Ambulatory Visit: Payer: Self-pay

## 2015-02-13 MED ORDER — CHOLECALCIFEROL 1.25 MG (50000 UT) PO CAPS: ORAL_CAPSULE | ORAL | Status: DC

## 2015-03-13 ENCOUNTER — Other Ambulatory Visit: Payer: Self-pay | Admitting: Cardiovascular Disease

## 2015-08-26 ENCOUNTER — Other Ambulatory Visit: Payer: Self-pay | Admitting: Internal Medicine

## 2015-08-28 NOTE — Telephone Encounter (Signed)
Wife left msg on triage starting pharmacy has sent request on husband vitamin d3 haven't receive response. Called pt wife back no answer LMOM per chart pt is due for CPX will  Send in 1 month supply until he makes cpx appt...Raechel Chute/lmb

## 2015-09-04 ENCOUNTER — Ambulatory Visit (INDEPENDENT_AMBULATORY_CARE_PROVIDER_SITE_OTHER): Payer: Managed Care, Other (non HMO) | Admitting: Internal Medicine

## 2015-09-04 ENCOUNTER — Encounter: Payer: Self-pay | Admitting: Internal Medicine

## 2015-09-04 ENCOUNTER — Other Ambulatory Visit (INDEPENDENT_AMBULATORY_CARE_PROVIDER_SITE_OTHER): Payer: Managed Care, Other (non HMO)

## 2015-09-04 VITALS — BP 126/88 | HR 72 | Temp 98.0°F | Resp 16 | Ht 66.0 in | Wt 209.0 lb

## 2015-09-04 DIAGNOSIS — Z0000 Encounter for general adult medical examination without abnormal findings: Secondary | ICD-10-CM | POA: Diagnosis not present

## 2015-09-04 DIAGNOSIS — I517 Cardiomegaly: Secondary | ICD-10-CM

## 2015-09-04 DIAGNOSIS — Z23 Encounter for immunization: Secondary | ICD-10-CM

## 2015-09-04 DIAGNOSIS — E559 Vitamin D deficiency, unspecified: Secondary | ICD-10-CM

## 2015-09-04 DIAGNOSIS — I2510 Atherosclerotic heart disease of native coronary artery without angina pectoris: Secondary | ICD-10-CM

## 2015-09-04 DIAGNOSIS — I10 Essential (primary) hypertension: Secondary | ICD-10-CM

## 2015-09-04 DIAGNOSIS — R7989 Other specified abnormal findings of blood chemistry: Secondary | ICD-10-CM | POA: Diagnosis not present

## 2015-09-04 DIAGNOSIS — E118 Type 2 diabetes mellitus with unspecified complications: Secondary | ICD-10-CM

## 2015-09-04 DIAGNOSIS — E1100 Type 2 diabetes mellitus with hyperosmolarity without nonketotic hyperglycemic-hyperosmolar coma (NKHHC): Secondary | ICD-10-CM | POA: Diagnosis not present

## 2015-09-04 DIAGNOSIS — E781 Pure hyperglyceridemia: Secondary | ICD-10-CM

## 2015-09-04 LAB — COMPREHENSIVE METABOLIC PANEL
ALBUMIN: 4.7 g/dL (ref 3.5–5.2)
ALK PHOS: 41 U/L (ref 39–117)
ALT: 29 U/L (ref 0–53)
AST: 19 U/L (ref 0–37)
BUN: 15 mg/dL (ref 6–23)
CHLORIDE: 103 meq/L (ref 96–112)
CO2: 30 mEq/L (ref 19–32)
Calcium: 9.8 mg/dL (ref 8.4–10.5)
Creatinine, Ser: 0.82 mg/dL (ref 0.40–1.50)
GFR: 104.74 mL/min (ref >60.00)
Glucose, Bld: 136 mg/dL — ABNORMAL HIGH (ref 70–99)
POTASSIUM: 3.9 meq/L (ref 3.5–5.1)
SODIUM: 140 meq/L (ref 135–145)
TOTAL PROTEIN: 7.4 g/dL (ref 6.0–8.3)
Total Bilirubin: 0.6 mg/dL (ref 0.2–1.2)

## 2015-09-04 LAB — LIPID PANEL
Cholesterol: 97 mg/dL (ref 0–200)
HDL: 27.3 mg/dL — AB (ref >39.00)
NONHDL: 69.6
TRIGLYCERIDES: 233 mg/dL — AB (ref 0.0–149.0)
Total CHOL/HDL Ratio: 4
VLDL: 46.6 mg/dL — AB (ref 0.0–40.0)

## 2015-09-04 LAB — CBC WITH DIFFERENTIAL/PLATELET
BASOS PCT: 0.7 % (ref 0.0–3.0)
Basophils Absolute: 0 10*3/uL (ref 0.0–0.1)
EOS PCT: 2.6 % (ref 0.0–5.0)
Eosinophils Absolute: 0.2 10*3/uL (ref 0.0–0.7)
HEMATOCRIT: 40.3 % (ref 39.0–52.0)
HEMOGLOBIN: 13.5 g/dL (ref 13.0–17.0)
Lymphocytes Relative: 35.8 % (ref 12.0–46.0)
Lymphs Abs: 2.5 10*3/uL (ref 0.7–4.0)
MCHC: 33.5 g/dL (ref 30.0–36.0)
MCV: 79.4 fl (ref 78.0–100.0)
MONOS PCT: 6.7 % (ref 3.0–12.0)
Monocytes Absolute: 0.5 10*3/uL (ref 0.1–1.0)
Neutro Abs: 3.8 10*3/uL (ref 1.4–7.7)
Neutrophils Relative %: 54.2 % (ref 43.0–77.0)
Platelets: 221 10*3/uL (ref 150.0–400.0)
RBC: 5.08 Mil/uL (ref 4.22–5.81)
RDW: 14.3 % (ref 11.5–15.5)
WBC: 7 10*3/uL (ref 4.0–10.5)

## 2015-09-04 LAB — VITAMIN D 25 HYDROXY (VIT D DEFICIENCY, FRACTURES): VITD: 39.74 ng/mL (ref 30.00–100.00)

## 2015-09-04 LAB — URINALYSIS, ROUTINE W REFLEX MICROSCOPIC
BILIRUBIN URINE: NEGATIVE
HGB URINE DIPSTICK: NEGATIVE
Ketones, ur: NEGATIVE
LEUKOCYTES UA: NEGATIVE
Nitrite: NEGATIVE
Specific Gravity, Urine: 1.01 (ref 1.000–1.030)
UROBILINOGEN UA: 0.2 (ref 0.0–1.0)
Urine Glucose: NEGATIVE
pH: 6.5 (ref 5.0–8.0)

## 2015-09-04 LAB — HEMOGLOBIN A1C: Hgb A1c MFr Bld: 7.3 % — ABNORMAL HIGH (ref 4.6–6.5)

## 2015-09-04 LAB — MICROALBUMIN / CREATININE URINE RATIO
CREATININE, U: 99.1 mg/dL
MICROALB UR: 13.6 mg/dL — AB (ref 0.0–1.9)
Microalb Creat Ratio: 13.7 mg/g (ref 0.0–30.0)

## 2015-09-04 LAB — PSA: PSA: 0.47 ng/mL (ref 0.10–4.00)

## 2015-09-04 LAB — HEPATITIS C ANTIBODY: HCV Ab: NEGATIVE

## 2015-09-04 LAB — TSH: TSH: 1.71 u[IU]/mL (ref 0.35–4.50)

## 2015-09-04 LAB — HIV ANTIBODY (ROUTINE TESTING W REFLEX): HIV 1&2 Ab, 4th Generation: NONREACTIVE

## 2015-09-04 LAB — LDL CHOLESTEROL, DIRECT: Direct LDL: 41 mg/dL

## 2015-09-04 MED ORDER — OMEGA-3-ACID ETHYL ESTERS 1 G PO CAPS: 2.0000 | ORAL_CAPSULE | Freq: Two times a day (BID) | ORAL | Status: DC

## 2015-09-04 MED ORDER — SITAGLIPTIN PHOS-METFORMIN HCL 50-500 MG PO TABS: 1.0000 | ORAL_TABLET | Freq: Two times a day (BID) | ORAL | Status: DC

## 2015-09-04 MED ORDER — OLMESARTAN MEDOXOMIL 20 MG PO TABS: 20.0000 mg | ORAL_TABLET | Freq: Every day | ORAL | Status: DC

## 2015-09-04 MED ORDER — CHOLECALCIFEROL 1.25 MG (50000 UT) PO CAPS: 50000.0000 [IU] | ORAL_CAPSULE | Freq: Every day | ORAL | Status: DC

## 2015-09-04 NOTE — Progress Notes (Signed)
Subjective:  Patient ID: [name], male    DOB: 01-18-1961  Age: 55 y.o. MRN: [id number]  CC: Annual Exam; Diabetes; Hypertension; and Hyperlipidemia   HPI [name] presents for a CPX and follow-up on several medical concerns.  He has a history of cardiomegaly and coronary artery disease. He denies any recent episodes of chest pain, dyspnea on exertion, edema, diaphoresis, or fatigue. He reports that his blood pressure has been well controlled at home.  He has not been effective with his lifestyle modification and is concerned that his blood sugars may not be well controlled.  He has been doing well on the statin with no muscle or joint aches. He has not been consistently taking the omega-3 fish oils and has not entered into any lifestyle modifications to lower his triglycerides.  Outpatient Prescriptions Prior to Visit  Medication Sig Dispense Refill  . aspirin 81 MG tablet Take 81 mg by mouth daily.      . clopidogrel (PLAVIX) 75 MG tablet TAKE 1 TABLET BY MOUTH EVERY DAY 30 tablet 5  . metoprolol tartrate (LOPRESSOR) 25 MG tablet Take 1 tablet (25 mg total) by mouth 2 (two) times daily. 180 tablet 3  . nitroGLYCERIN (NITROSTAT) 0.4 MG SL tablet Place 1 tablet (0.4 mg total) under the tongue every 5 (five) minutes as needed for chest pain. 25 tablet 2  . rosuvastatin (CRESTOR) 40 MG tablet Take 1 tablet (40 mg total) by mouth daily. 90 tablet 3  . BENICAR 20 MG tablet TAKE 1 TABLET BY MOUTH ONCE A DAY 90 tablet 3  . Cholecalciferol (D3-50) 50000 units capsule Take 1 capsule (50,000 Units total) by mouth daily. Overdue for yearly physical w/labs must see md for refills 4 capsule 0  . omega-3 acid ethyl esters (LOVAZA) 1 G capsule TAKE 2 CAPSULES BY MOUTH TWICE A DAY 120 capsule 11  . sitaGLIPtin-metformin (JANUMET) 50-500 MG per tablet Take 1 tablet by mouth 2 (two) times daily with a meal. 180 tablet 1  . omega-3 acid ethyl esters (LOVAZA) 1 G capsule Take 2 capsules (2 g  total) by mouth 2 (two) times daily. 120 capsule 11   No facility-administered medications prior to visit.    ROS Review of Systems  Constitutional: Negative.  Negative for diaphoresis and fatigue.  HENT: Negative.   Eyes: Negative.  Negative for visual disturbance.  Respiratory: Negative.  Negative for cough, choking, chest tightness, shortness of breath and stridor.   Cardiovascular: Negative.  Negative for chest pain, palpitations and leg swelling.  Gastrointestinal: Negative.  Negative for nausea, vomiting, abdominal pain, diarrhea, constipation and blood in stool.  Endocrine: Negative.  Negative for polydipsia, polyphagia and polyuria.  Genitourinary: Negative.  Negative for dysuria, urgency, hematuria, decreased urine volume and difficulty urinating.  Musculoskeletal: Negative.  Negative for myalgias, back pain, joint swelling and neck pain.  Skin: Negative.  Negative for color change and rash.  Allergic/Immunologic: Negative.   Neurological: Negative.  Negative for dizziness, weakness and light-headedness.  Hematological: Negative.  Negative for adenopathy. Does not bruise/bleed easily.  Psychiatric/Behavioral: Negative.     Objective:  Ht 5\' 6"  (1.676 m)  Wt 209 lb (94.802 kg)  BMI 33.75 kg/m2  BP Readings from Last 3 Encounters:  09/30/14 124/90  08/20/14 122/80  09/18/13 128/82    Wt Readings from Last 3 Encounters:  09/04/15 209 lb (94.802 kg)  09/30/14 197 lb 1.9 oz (89.413 kg)  08/20/14 204 lb (92.534 kg)    Physical Exam  Constitutional:  He is oriented to person, place, and time. He appears well-developed and well-nourished. No distress.  HENT:  Head: Normocephalic and atraumatic.  Mouth/Throat: Oropharynx is clear and moist. No oropharyngeal exudate.  Eyes: Conjunctivae are normal. Right eye exhibits no discharge. Left eye exhibits no discharge. No scleral icterus.  Neck: Normal range of motion. Neck supple. No JVD present. No tracheal deviation present.  No thyromegaly present.  Cardiovascular: Normal rate, regular rhythm, normal heart sounds and intact distal pulses.  Exam reveals no gallop and no friction rub.   No murmur heard. EKG -  Sinus  Rhythm  WITHIN NORMAL LIMITS   Pulmonary/Chest: Effort normal and breath sounds normal. No stridor. No respiratory distress. He has no wheezes. He has no rales. He exhibits no tenderness.  Abdominal: Soft. Bowel sounds are normal. He exhibits no distension and no mass. There is no tenderness. There is no rebound and no guarding. Hernia confirmed negative in the right inguinal area and confirmed negative in the left inguinal area.  Genitourinary: Prostate normal and penis normal. Rectal exam shows no external hemorrhoid, no internal hemorrhoid and no tenderness. Prostate is not enlarged and not tender. Right testis shows no mass, no swelling and no tenderness. Right testis is descended. Left testis shows no mass, no swelling and no tenderness. Left testis is descended. Circumcised. No penile erythema or penile tenderness. No discharge found.  Musculoskeletal: Normal range of motion. He exhibits no edema or tenderness.  Lymphadenopathy:    He has no cervical adenopathy.       Right: No inguinal adenopathy present.       Left: No inguinal adenopathy present.  Neurological: He is oriented to person, place, and time.  Skin: Skin is warm and dry. No rash noted. He is not diaphoretic. No erythema. No pallor.  Psychiatric: He has a normal mood and affect. His behavior is normal. Judgment and thought content normal.  Vitals reviewed.   Lab Results  Component Value Date   WBC 7.0 09/04/2015   HGB 13.5 09/04/2015   HCT 40.3 09/04/2015   PLT 221.0 09/04/2015   GLUCOSE 136* 09/04/2015   CHOL 97 09/04/2015   TRIG 233.0* 09/04/2015   HDL 27.30* 09/04/2015   LDLDIRECT 41.0 09/04/2015   LDLCALC 35 08/21/2013   ALT 29 09/04/2015   AST 19 09/04/2015   NA 140 09/04/2015   K 3.9 09/04/2015   CL 103 09/04/2015    CREATININE 0.82 09/04/2015   BUN 15 09/04/2015   CO2 30 09/04/2015   TSH 1.71 09/04/2015   PSA 0.47 09/04/2015   INR 0.9 ratio 04/22/2009   HGBA1C 7.3* 09/04/2015   MICROALBUR 13.6* 09/04/2015    Dg Lumbar Spine Complete  09/05/2012  *RADIOLOGY REPORT* Clinical Data: Low back pain and right hip pain.  No known injury. LUMBAR SPINE - COMPLETE 4+ VIEW Comparison: CT 07/16/2005. Findings: There are five non-rib bearing lumbar-type vertebral bodies.  Intervertebral disc spaces are relatively well maintained. Alignment is normal.  No fracture, bony destruction, or pars defect is seen.  SI joints appear intact. There are minimal early degenerative spondylosis changes are seen. Minimal nonaneurysmal arterial calcification is present. IMPRESSION: Minimal early degenerative spondylosis findings.  Minimal nonaneurysmal arterial calcification Original Report Authenticated By: Onalee Hua Call    Assessment & Plan:   Yer was seen today for annual exam, diabetes, hypertension and hyperlipidemia.  Diagnoses and all orders for this visit:  Atherosclerosis of native coronary artery of native heart without angina pectoris- he has had no concerning  symptoms, his EKG is normal, will continue rick factor modification  Routine general medical examination at a health care facility- vaccines were reviewed and updated, exam completed, labs ordered and reviewed, colonoscopy is UTD, pt ed material was given -     Comprehensive metabolic panel; Future -     CBC with Differential/Platelet; Future -     Lipid panel; Future -     PSA; Future -     TSH; Future -     Urinalysis, Routine w reflex microscopic (not at [facility]); Future -     Hepatitis C antibody; Future -     HIV antibody; Future -     EKG 12-Lead  Type 2 diabetes mellitus with complication, without long-term current use of insulin (HCC)- his A1C is up to 7.3%, will cont current meds and he agrees to work on his lifestyle modification -     Hemoglobin  A1c; Future -     Microalbumin / creatinine urine ratio; Future -     olmesartan (BENICAR) 20 MG tablet; Take 1 tablet (20 mg total) by mouth daily. -     Ambulatory referral to Ophthalmology  Vitamin D deficiency- his VIt D level is normal now -     VITAMIN D 25 Hydroxy (Vit-D Deficiency, Fractures); Future -     Cholecalciferol (D3-50) 50000 units capsule; Take 1 capsule (50,000 Units total) by mouth daily. Overdue for yearly physical w/labs must see md for refills  Uncontrolled type 2 diabetes mellitus with hyperosmolarity without coma, without long-term current use of insulin (HCC) -     sitaGLIPtin-metformin (JANUMET) 50-500 MG tablet; Take 1 tablet by mouth 2 (two) times daily with a meal.  Encounter for immunization -     Flu Vaccine QUAD 36+ mos IM  Pure hyperglyceridemia- improvement noted, he will work on his diet and will try to lose weight -     omega-3 acid ethyl esters (LOVAZA) 1 g capsule; Take 2 capsules (2 g total) by mouth 2 (two) times daily.  Essential hypertension- his BP is well controlled, lytes and renal function are stable  Cardiomegaly   I have discontinued Mr. Guadron's omega-3 acid ethyl esters. I have changed his BENICAR to olmesartan. I have also changed his sitaGLIPtin-metformin and omega-3 acid ethyl esters. Additionally, I am having him maintain his aspirin, nitroGLYCERIN, rosuvastatin, metoprolol tartrate, clopidogrel, and Cholecalciferol.  Meds ordered this encounter  Medications  . Cholecalciferol (D3-50) 50000 units capsule    Sig: Take 1 capsule (50,000 Units total) by mouth daily. Overdue for yearly physical w/labs must see md for refills    Dispense:  4 capsule    Refill:  11  . sitaGLIPtin-metformin (JANUMET) 50-500 MG tablet    Sig: Take 1 tablet by mouth 2 (two) times daily with a meal.    Dispense:  180 tablet    Refill:  1  . omega-3 acid ethyl esters (LOVAZA) 1 g capsule    Sig: Take 2 capsules (2 g total) by mouth 2 (two) times daily.      Dispense:  120 capsule    Refill:  11  . olmesartan (BENICAR) 20 MG tablet    Sig: Take 1 tablet (20 mg total) by mouth daily.    Dispense:  90 tablet    Refill:  3     Follow-up: Return in about 6 months (around 03/06/2016).  Sanda Linger, MD

## 2015-09-04 NOTE — Progress Notes (Signed)
Pre visit review using our clinic review tool, if applicable. No additional management support is needed unless otherwise documented below in the visit note. 

## 2015-09-04 NOTE — Patient Instructions (Signed)

## 2015-09-05 ENCOUNTER — Encounter: Payer: Self-pay | Admitting: Internal Medicine

## 2015-09-16 ENCOUNTER — Other Ambulatory Visit: Payer: Self-pay | Admitting: Internal Medicine

## 2015-09-19 ENCOUNTER — Telehealth: Payer: Self-pay

## 2015-09-19 NOTE — Telephone Encounter (Signed)
Cologuard form faxed will send to scan

## 2015-10-13 ENCOUNTER — Telehealth: Payer: Self-pay | Admitting: Internal Medicine

## 2015-10-13 NOTE — Telephone Encounter (Signed)
LMOVM will be at front

## 2015-10-13 NOTE — Telephone Encounter (Signed)
Spouse is requesting another savings card for janumet.  If there is not a savings card available she is requesting her husband to be placed on something cheaper.

## 2015-10-29 ENCOUNTER — Other Ambulatory Visit: Payer: Self-pay | Admitting: Cardiovascular Disease

## 2015-10-30 ENCOUNTER — Other Ambulatory Visit: Payer: Self-pay

## 2015-10-30 MED ORDER — CLOPIDOGREL BISULFATE 75 MG PO TABS: 75.0000 mg | ORAL_TABLET | Freq: Every day | ORAL | Status: DC

## 2015-10-30 NOTE — Telephone Encounter (Signed)
David BollmanMichael Cooper, MD at 09/30/2014 8:23 AM  clopidogrel (PLAVIX) 75 MG tabletTAKE 1 TABLET BY MOUTH EVERY DAY Current medicines are reviewed with the patient today. The patient does not have concerns regarding medicines.  The following changes have been made: no change  Your physician recommends that you continue on your current medications as directed. Please refer to the Current Medication list given to you today

## 2015-11-04 ENCOUNTER — Other Ambulatory Visit: Payer: Self-pay

## 2015-11-04 DIAGNOSIS — I10 Essential (primary) hypertension: Secondary | ICD-10-CM

## 2015-11-04 DIAGNOSIS — E781 Pure hyperglyceridemia: Secondary | ICD-10-CM

## 2015-11-04 MED ORDER — METOPROLOL TARTRATE 25 MG PO TABS: 25.0000 mg | ORAL_TABLET | Freq: Two times a day (BID) | ORAL | Status: DC

## 2015-11-26 ENCOUNTER — Encounter: Payer: Self-pay | Admitting: Cardiovascular Disease

## 2015-12-01 ENCOUNTER — Ambulatory Visit (INDEPENDENT_AMBULATORY_CARE_PROVIDER_SITE_OTHER): Payer: Managed Care, Other (non HMO) | Admitting: Cardiovascular Disease

## 2015-12-01 ENCOUNTER — Encounter: Payer: Self-pay | Admitting: *Deleted

## 2015-12-01 VITALS — BP 124/80 | HR 84 | Ht 66.0 in | Wt 203.0 lb

## 2015-12-01 DIAGNOSIS — I10 Essential (primary) hypertension: Secondary | ICD-10-CM

## 2015-12-01 DIAGNOSIS — I2510 Atherosclerotic heart disease of native coronary artery without angina pectoris: Secondary | ICD-10-CM

## 2015-12-01 DIAGNOSIS — E781 Pure hyperglyceridemia: Secondary | ICD-10-CM | POA: Diagnosis not present

## 2015-12-01 MED ORDER — METOPROLOL TARTRATE 25 MG PO TABS: 25.0000 mg | ORAL_TABLET | Freq: Two times a day (BID) | ORAL | Status: DC

## 2015-12-01 MED ORDER — NITROGLYCERIN 0.4 MG SL SUBL: 0.4000 mg | SUBLINGUAL_TABLET | SUBLINGUAL | Status: DC | PRN

## 2015-12-01 MED ORDER — ROSUVASTATIN CALCIUM 40 MG PO TABS: 40.0000 mg | ORAL_TABLET | Freq: Every day | ORAL | Status: DC

## 2015-12-01 MED ORDER — CLOPIDOGREL BISULFATE 75 MG PO TABS: 75.0000 mg | ORAL_TABLET | Freq: Every day | ORAL | Status: DC

## 2015-12-01 NOTE — Patient Instructions (Signed)

## 2015-12-01 NOTE — Progress Notes (Signed)
Cardiology Office Note Date:  12/01/2015   ID:  [name], DOB 1960/12/14, MRN [id number]  PCP:  David Linger, MD  Cardiologist:  David Bollman, MD    Chief Complaint  Patient presents with  . Follow-up    cad   History of Present Illness: [name] is a 55 y.o. male who presents for follow-up of coronary artery disease. The patient has had multiple PCI procedures. Other comorbid medical conditions include hypertension, hyperlipidemia, and Type 2 diabetes. His most recent heart catheterization in 2008 demonstrated normal LV systolic function, continued patency of the stented segments in the RCA and distal left circumflex, and nonobstructive disease elsewhere. He has been managed medically since that time.  The patient is doing well. He is exercising on the treadmill 3 days per week. He hasn't been able to lose any weight. He states that he does not eat sweets or high starch foods. Today, he denies symptoms of palpitations, chest pain, shortness of breath, orthopnea, PND, lower extremity edema, dizziness, or syncope.  Past Medical History  Diagnosis Date  . Coronary artery disease     s/p BMS to RCA and CFX. s/p DES to RCA 2/2 ISR 2010  . Hypertension   . Hyperlipidemia   . Old myocardial infarction   . Paroxysmal ventricular tachycardia (HCC)   . Asthma   . Diabetes mellitus without complication [facility])     Past Surgical History  Procedure Laterality Date  . Coronary stent placement  2002, 2007, 2010    right    Current Outpatient Prescriptions  Medication Sig Dispense Refill  . aspirin 81 MG tablet Take 81 mg by mouth daily.      . Cholecalciferol (D3-50) 50000 units capsule Take 1 capsule (50,000 Units total) by mouth daily. Overdue for yearly physical w/labs must see md for refills 4 capsule 11  . clopidogrel (PLAVIX) 75 MG tablet Take 1 tablet (75 mg total) by mouth daily. 90 tablet 3  . metoprolol tartrate (LOPRESSOR) 25 MG tablet Take 1 tablet (25 mg total)  by mouth 2 (two) times daily. 180 tablet 3  . nitroGLYCERIN (NITROSTAT) 0.4 MG SL tablet Place 1 tablet (0.4 mg total) under the tongue every 5 (five) minutes as needed for chest pain. 25 tablet 2  . olmesartan (BENICAR) 20 MG tablet Take 1 tablet (20 mg total) by mouth daily. 90 tablet 3  . omega-3 acid ethyl esters (LOVAZA) 1 g capsule Take 2 capsules (2 g total) by mouth 2 (two) times daily. 120 capsule 11  . rosuvastatin (CRESTOR) 40 MG tablet Take 1 tablet (40 mg total) by mouth daily. 90 tablet 3  . sitaGLIPtin-metformin (JANUMET) 50-500 MG tablet Take 1 tablet by mouth 2 (two) times daily with a meal. 180 tablet 1   No current facility-administered medications for this visit.    Allergies:   Heparin   Social History:  The patient  reports that he quit smoking about 4 years ago. His smoking use included Cigarettes. He has a 45 pack-year smoking history. He has never used smokeless tobacco. He reports that he does not drink alcohol or use illicit drugs.   Family History:  The patient's  family history includes Alzheimer's disease in his mother; Diabetes in his mother and sister; Heart disease in his brother, mother, and sister; Kidney disease in his sister; Peripheral vascular disease in his sister. There is no history of Cancer, Alcohol abuse, Early death, Hyperlipidemia, Hypertension, or Stroke.    ROS:  Please see the  history of present illness.  Otherwise, review of systems is positive for Back pain, fatigue, excessive sweating.  All other systems are reviewed and negative.    PHYSICAL EXAM: VS:  BP 124/80 mmHg  Pulse 84  Ht 5\' 6"  (1.676 m)  Wt 203 lb (92.08 kg)  BMI 32.78 kg/m2 , BMI Body mass index is 32.78 kg/(m^2). GEN: Well nourished, well developed, pleasant overweight male in no acute distress HEENT: normal Neck: no JVD, no masses. No carotid bruits Cardiac: RRR without murmur or gallop                Respiratory:  clear to auscultation bilaterally, normal work of  breathing GI: soft, nontender, nondistended, + BS MS: no deformity or atrophy Ext: no pretibial edema, pedal pulses 2+= bilaterally Skin: warm and dry, no rash Neuro:  Strength and sensation are intact Psych: euthymic mood, full affect  EKG:  EKG is not ordered today. The ekg performed 09/04/2015 is reviewed and shows normal sinus rhythm, within normal limits.  Recent Labs: 09/04/2015: ALT 29; BUN 15; Creatinine, Ser 0.82; Hemoglobin 13.5; Platelets 221.0; Potassium 3.9; Sodium 140; TSH 1.71   Lipid Panel     Component Value Date/Time   CHOL 97 09/04/2015 1117   CHOL 219 08/20/2012   TRIG 233.0* 09/04/2015 1117   TRIG 402 08/20/2012   HDL 27.30* 09/04/2015 1117   CHOLHDL 4 09/04/2015 1117   VLDL 46.6* 09/04/2015 1117   LDLCALC 35 08/21/2013 0850   LDLCALC 107 08/20/2012   LDLDIRECT 41.0 09/04/2015 1117   LDLDIRECT 129 08/20/2012      Wt Readings from Last 3 Encounters:  12/01/15 203 lb (92.08 kg)  09/04/15 209 lb (94.802 kg)  09/30/14 197 lb 1.9 oz (89.413 kg)    ASSESSMENT AND PLAN: 1.  CAD, native vessel, without symptoms of angina: The patient remains on long-term dual antiplatelet therapy without any history of bleeding problems after multiple PCI procedures. His previous anginal equivalent was left arm pain and he has not experienced this in many years. He was encouraged to increase his exercise program with focus on weight loss.  2. Essential hypertension: Blood pressure is well controlled on current medicines which are reviewed today.  3. Hyperlipidemia: Most recent lipids are reviewed. LDL is at goal. Triglycerides are high. We discussed the relationship of glycemic control and elevated triglycerides. Also discussed the importance of weight loss and lifestyle modification. He will continue on Crestor 40 mg daily.  4. Type 2 diabetes: Followed by Dr. Yetta BarreJones. Labs are reviewed today. Counseling done with regard to lifestyle modification.   Current medicines are  reviewed with the patient today.  The patient does not have concerns regarding medicines.  Labs/ tests ordered today include:  No orders of the defined types were placed in this encounter.    Disposition:   FU one year  Signed, David Bollmanooper, Anson Peddie, MD  12/01/2015 2:00 PM    [facility] [address], [place], [place]  [address] Phone: [phone]; Fax: [phone]

## 2016-06-22 ENCOUNTER — Telehealth: Payer: Self-pay | Admitting: Internal Medicine

## 2016-06-22 DIAGNOSIS — E1100 Type 2 diabetes mellitus with hyperosmolarity without nonketotic hyperglycemic-hyperosmolar coma (NKHHC): Secondary | ICD-10-CM

## 2016-06-23 MED ORDER — SITAGLIPTIN PHOS-METFORMIN HCL 50-500 MG PO TABS: 1.0000 | ORAL_TABLET | Freq: Two times a day (BID) | ORAL | 0 refills | Status: DC

## 2016-06-23 NOTE — Telephone Encounter (Signed)
erx sent to [facility].  

## 2016-06-23 NOTE — Addendum Note (Signed)
Addended by: Radford PaxAIRRIKIER DAVIDSON, Mohamedamin Nifong M on: 06/23/2016 12:15 PM   Modules accepted: Orders

## 2016-06-23 NOTE — Telephone Encounter (Signed)
Patient has scheduled appt for 1/9 at 8:00am.  Might have to call push back a few days b/c patient just started new job and have to give a notice to be off.  Wife states patient will be out of Janumet soon.  Is requesting refill of this to be sent to [facility] on [address].  States that she will also need a savings card.

## 2016-06-29 ENCOUNTER — Encounter: Payer: Self-pay | Admitting: Internal Medicine

## 2016-06-29 ENCOUNTER — Ambulatory Visit (INDEPENDENT_AMBULATORY_CARE_PROVIDER_SITE_OTHER): Payer: Managed Care, Other (non HMO) | Admitting: Internal Medicine

## 2016-06-29 ENCOUNTER — Other Ambulatory Visit (INDEPENDENT_AMBULATORY_CARE_PROVIDER_SITE_OTHER): Payer: Managed Care, Other (non HMO)

## 2016-06-29 VITALS — BP 124/70 | HR 80 | Temp 97.8°F | Resp 16 | Ht 66.0 in | Wt 207.5 lb

## 2016-06-29 DIAGNOSIS — I2510 Atherosclerotic heart disease of native coronary artery without angina pectoris: Secondary | ICD-10-CM

## 2016-06-29 DIAGNOSIS — E118 Type 2 diabetes mellitus with unspecified complications: Secondary | ICD-10-CM

## 2016-06-29 DIAGNOSIS — R7989 Other specified abnormal findings of blood chemistry: Secondary | ICD-10-CM | POA: Diagnosis not present

## 2016-06-29 DIAGNOSIS — I10 Essential (primary) hypertension: Secondary | ICD-10-CM | POA: Diagnosis not present

## 2016-06-29 DIAGNOSIS — E781 Pure hyperglyceridemia: Secondary | ICD-10-CM

## 2016-06-29 DIAGNOSIS — E785 Hyperlipidemia, unspecified: Secondary | ICD-10-CM

## 2016-06-29 DIAGNOSIS — R112 Nausea with vomiting, unspecified: Secondary | ICD-10-CM | POA: Diagnosis not present

## 2016-06-29 DIAGNOSIS — R3129 Other microscopic hematuria: Secondary | ICD-10-CM

## 2016-06-29 DIAGNOSIS — I70213 Atherosclerosis of native arteries of extremities with intermittent claudication, bilateral legs: Secondary | ICD-10-CM

## 2016-06-29 LAB — URINALYSIS, ROUTINE W REFLEX MICROSCOPIC
Bilirubin Urine: NEGATIVE
Ketones, ur: NEGATIVE
Leukocytes, UA: NEGATIVE
NITRITE: NEGATIVE
Total Protein, Urine: 100 — AB
URINE GLUCOSE: NEGATIVE
Urobilinogen, UA: 0.2 (ref 0.0–1.0)
pH: 6 (ref 5.0–8.0)

## 2016-06-29 LAB — CBC WITH DIFFERENTIAL/PLATELET
BASOS PCT: 0.4 % (ref 0.0–3.0)
Basophils Absolute: 0 10*3/uL (ref 0.0–0.1)
EOS PCT: 2.5 % (ref 0.0–5.0)
Eosinophils Absolute: 0.2 10*3/uL (ref 0.0–0.7)
HEMATOCRIT: 42.7 % (ref 39.0–52.0)
HEMOGLOBIN: 14.4 g/dL (ref 13.0–17.0)
LYMPHS PCT: 30.1 % (ref 12.0–46.0)
Lymphs Abs: 2.3 10*3/uL (ref 0.7–4.0)
MCHC: 33.8 g/dL (ref 30.0–36.0)
MCV: 79.5 fl (ref 78.0–100.0)
MONOS PCT: 7.2 % (ref 3.0–12.0)
Monocytes Absolute: 0.6 10*3/uL (ref 0.1–1.0)
Neutro Abs: 4.6 10*3/uL (ref 1.4–7.7)
Neutrophils Relative %: 59.8 % (ref 43.0–77.0)
Platelets: 227 10*3/uL (ref 150.0–400.0)
RBC: 5.36 Mil/uL (ref 4.22–5.81)
RDW: 14.4 % (ref 11.5–15.5)
WBC: 7.7 10*3/uL (ref 4.0–10.5)

## 2016-06-29 LAB — HEMOGLOBIN A1C: Hgb A1c MFr Bld: 8 % — ABNORMAL HIGH (ref 4.6–6.5)

## 2016-06-29 LAB — COMPREHENSIVE METABOLIC PANEL
ALBUMIN: 4.8 g/dL (ref 3.5–5.2)
ALK PHOS: 47 U/L (ref 39–117)
ALT: 31 U/L (ref 0–53)
AST: 24 U/L (ref 0–37)
BUN: 19 mg/dL (ref 6–23)
CALCIUM: 10.1 mg/dL (ref 8.4–10.5)
CHLORIDE: 100 meq/L (ref 96–112)
CO2: 28 mEq/L (ref 19–32)
Creatinine, Ser: 0.89 mg/dL (ref 0.40–1.50)
GFR: 94.99 mL/min (ref >60.00)
Glucose, Bld: 184 mg/dL — ABNORMAL HIGH (ref 70–99)
POTASSIUM: 4.1 meq/L (ref 3.5–5.1)
SODIUM: 139 meq/L (ref 135–145)
TOTAL PROTEIN: 8.2 g/dL (ref 6.0–8.3)
Total Bilirubin: 0.5 mg/dL (ref 0.2–1.2)

## 2016-06-29 LAB — MICROALBUMIN / CREATININE URINE RATIO
CREATININE, U: 142.4 mg/dL
Microalb Creat Ratio: 38.8 mg/g — ABNORMAL HIGH (ref 0.0–30.0)
Microalb, Ur: 55.3 mg/dL — ABNORMAL HIGH (ref 0.0–1.9)

## 2016-06-29 LAB — LDL CHOLESTEROL, DIRECT: Direct LDL: 74 mg/dL

## 2016-06-29 LAB — TSH: TSH: 1.85 u[IU]/mL (ref 0.35–4.50)

## 2016-06-29 LAB — LIPID PANEL
CHOLESTEROL: 165 mg/dL (ref 0–200)
HDL: 34.1 mg/dL — ABNORMAL LOW (ref >39.00)
Total CHOL/HDL Ratio: 5
Triglycerides: 419 mg/dL — ABNORMAL HIGH (ref 0.0–149.0)

## 2016-06-29 LAB — LIPASE: Lipase: 26 U/L (ref 11.0–59.0)

## 2016-06-29 LAB — AMYLASE: AMYLASE: 31 U/L (ref 27–131)

## 2016-06-29 MED ORDER — SITAGLIPTIN PHOS-METFORMIN HCL 50-1000 MG PO TABS: 1.0000 | ORAL_TABLET | Freq: Two times a day (BID) | ORAL | 1 refills | Status: DC

## 2016-06-29 MED ORDER — OMEGA-3-ACID ETHYL ESTERS 1 G PO CAPS: 2.0000 | ORAL_CAPSULE | Freq: Two times a day (BID) | ORAL | 11 refills | Status: DC

## 2016-06-29 NOTE — Patient Instructions (Signed)
Nausea and Vomiting, Adult Nausea is the feeling that you have an upset stomach or have to vomit. As nausea gets worse, it can lead to vomiting. Vomiting occurs when stomach contents are thrown up and out of the mouth. Vomiting can make you feel weak and cause you to become dehydrated. Dehydration can make you tired and thirsty, cause you to have a dry mouth, and decrease how often you urinate. Older adults and people with other diseases or a weak immune system are at higher risk for dehydration. It is important to treat your nausea and vomiting as told by your health care provider. Follow these instructions at home: Follow instructions from your health care provider about how to care for yourself at home. Eating and drinking Follow these recommendations as told by your health care provider:  Take an oral rehydration solution (ORS). This is a drink that is sold at pharmacies and retail stores.  Drink clear fluids in small amounts as you are able. Clear fluids include water, ice chips, diluted fruit juice, and low-calorie sports drinks.  Eat bland, easy-to-digest foods in small amounts as you are able. These foods include bananas, applesauce, rice, lean meats, toast, and crackers.  Avoid fluids that contain a lot of sugar or caffeine, such as energy drinks, sports drinks, and soda.  Avoid alcohol.  Avoid spicy or fatty foods.  General instructions  Drink enough fluid to keep your urine clear or pale yellow.  Wash your hands often. If soap and water are not available, use hand sanitizer.  Make sure that all people in your household wash their hands well and often.  Take over-the-counter and prescription medicines only as told by your health care provider.  Rest at home while you recover.  Watch your condition for any changes.  Breathe slowly and deeply when you feel nauseated.  Keep all follow-up visits as told by your health care provider. This is important. Contact a health care  provider if:  You have a fever.  You cannot keep fluids down.  Your symptoms get worse.  You have new symptoms.  Your nausea does not go away after two days.  You feel light-headed or dizzy.  You have a headache.  You have muscle cramps. Get help right away if:  You have pain in your chest, neck, arm, or jaw.  You feel extremely weak or you faint.  You have persistent vomiting.  You see blood in your vomit.  Your vomit looks like black coffee grounds.  You have bloody or black stools or stools that look like tar.  You have a severe headache, a stiff neck, or both.  You have a rash.  You have severe pain, cramping, or bloating in your abdomen.  You have trouble breathing or you are breathing very quickly.  Your heart is beating very quickly.  Your skin feels cold and clammy.  You feel confused.  You have pain when you urinate.  You have signs of dehydration, such as: ? Dark urine, very little urine, or no urine. ? Cracked lips. ? Dry mouth. ? Sunken eyes. ? Sleepiness. ? Weakness. These symptoms may represent a serious problem that is an emergency. Do not wait to see if the symptoms will go away. Get medical help right away. Call your local emergency services (911 in the [place] Do not drive yourself to the hospital. This information is not intended to replace advice given to you by your health care provider. Make sure you discuss any questions you   have with your health care provider. Document Released: 06/07/2005 Document Revised: 11/10/2015 Document Reviewed: 02/11/2015 Elsevier Interactive Patient Education  2017 [employer]  

## 2016-06-29 NOTE — Progress Notes (Signed)
Pre visit review using our clinic review tool, if applicable. No additional management support is needed unless otherwise documented below in the visit note. 

## 2016-06-29 NOTE — Progress Notes (Signed)
Subjective:  Patient ID: [name], male    DOB: 31-May-1961  Age: 56 y.o. MRN: [id number]  CC: Hypertension and Diabetes   HPI Tomothy Eddins presents for f/up on the above medical problems but he also complains of a three-week history of intermittent episodes of postprandial nausea and vomiting. He has maintained a normal appetite and has not lost weight. He has had a few episodes of excessive thirst but denies polyphagia or polyuria. He's had no episodes of abdominal pain and denies diarrhea, constipation, dysuria, or hematuria.  He complains of his feet feel cold intermittently. He noticed the symptoms when he is at rest. He denies claudication.  Outpatient Medications Prior to Visit  Medication Sig Dispense Refill  . aspirin 81 MG tablet Take 81 mg by mouth daily.      . Cholecalciferol (D3-50) 50000 units capsule Take 1 capsule (50,000 Units total) by mouth daily. Overdue for yearly physical w/labs must see md for refills 4 capsule 11  . clopidogrel (PLAVIX) 75 MG tablet Take 1 tablet (75 mg total) by mouth daily. 90 tablet 3  . metoprolol tartrate (LOPRESSOR) 25 MG tablet Take 1 tablet (25 mg total) by mouth 2 (two) times daily. 180 tablet 3  . olmesartan (BENICAR) 20 MG tablet Take 1 tablet (20 mg total) by mouth daily. 90 tablet 3  . rosuvastatin (CRESTOR) 40 MG tablet Take 1 tablet (40 mg total) by mouth daily. 90 tablet 3  . omega-3 acid ethyl esters (LOVAZA) 1 g capsule Take 2 capsules (2 g total) by mouth 2 (two) times daily. 120 capsule 11  . sitaGLIPtin-metformin (JANUMET) 50-500 MG tablet Take 1 tablet by mouth 2 (two) times daily with a meal. 60 tablet 0  . nitroGLYCERIN (NITROSTAT) 0.4 MG SL tablet Place 1 tablet (0.4 mg total) under the tongue every 5 (five) minutes as needed for chest pain. (Patient not taking: Reported on 06/29/2016) 25 tablet 2   No facility-administered medications prior to visit.     ROS Review of Systems  Constitutional: Negative for appetite  change, diaphoresis, fatigue and unexpected weight change.  HENT: Negative.  Negative for trouble swallowing and voice change.   Eyes: Negative for visual disturbance.  Respiratory: Negative for cough, chest tightness, shortness of breath, wheezing and stridor.   Cardiovascular: Negative.  Negative for chest pain, palpitations and leg swelling.  Gastrointestinal: Positive for nausea and vomiting. Negative for abdominal distention, abdominal pain, anal bleeding, blood in stool, constipation, diarrhea and rectal pain.  Endocrine: Positive for polydipsia. Negative for polyphagia and polyuria.  Genitourinary: Negative.  Negative for difficulty urinating, frequency, hematuria and urgency.  Musculoskeletal: Negative.  Negative for back pain, myalgias and neck pain.  Skin: Negative.  Negative for color change and rash.  Allergic/Immunologic: Negative.   Neurological: Negative.  Negative for dizziness, weakness, light-headedness and numbness.  Hematological: Negative for adenopathy. Does not bruise/bleed easily.  Psychiatric/Behavioral: Negative.     Objective:  BP 124/70 (BP Location: Left Arm, Patient Position: Sitting, Cuff Size: Large)   Pulse 80   Temp 97.8 F (36.6 C) (Oral)   Resp 16   Ht 5\' 6"  (1.676 m)   Wt 207 lb 8 oz (94.1 kg)   SpO2 94%   BMI 33.49 kg/m   BP Readings from Last 3 Encounters:  06/29/16 124/70  12/01/15 124/80  09/04/15 126/88    Wt Readings from Last 3 Encounters:  06/29/16 207 lb 8 oz (94.1 kg)  12/01/15 203 lb (92.1 kg)  09/04/15 209  lb (94.8 kg)    Physical Exam  Constitutional: He is oriented to person, place, and time. He appears well-developed and well-nourished. No distress.  HENT:  Head: Normocephalic and atraumatic.  Mouth/Throat: Oropharynx is clear and moist. No oropharyngeal exudate.  Eyes: Conjunctivae are normal. Right eye exhibits no discharge. Left eye exhibits no discharge. No scleral icterus.  Neck: Normal range of motion. Neck  supple. No JVD present. No tracheal deviation present. No thyromegaly present.  Cardiovascular: Normal rate, regular rhythm, normal heart sounds and intact distal pulses.  Exam reveals no gallop and no friction rub.   No murmur heard. Pulmonary/Chest: Effort normal and breath sounds normal. No stridor. No respiratory distress. He has no wheezes. He has no rales. He exhibits no tenderness.  Abdominal: Soft. Bowel sounds are normal. He exhibits no distension and no mass. There is no tenderness. There is no rebound and no guarding.  Musculoskeletal: Normal range of motion. He exhibits no edema, tenderness or deformity.  Lymphadenopathy:    He has no cervical adenopathy.  Neurological: He is oriented to person, place, and time.  Skin: Skin is warm and dry. No rash noted. He is not diaphoretic. No erythema. No pallor.  Vitals reviewed.   Lab Results  Component Value Date   WBC 7.7 06/29/2016   HGB 14.4 06/29/2016   HCT 42.7 06/29/2016   PLT 227.0 06/29/2016   GLUCOSE 184 (H) 06/29/2016   CHOL 165 06/29/2016   TRIG (H) 06/29/2016    419.0 Triglyceride is over 400; calculations on Lipids are invalid.   HDL 34.10 (L) 06/29/2016   LDLDIRECT 74.0 06/29/2016   LDLCALC 35 08/21/2013   ALT 31 06/29/2016   AST 24 06/29/2016   NA 139 06/29/2016   K 4.1 06/29/2016   CL 100 06/29/2016   CREATININE 0.89 06/29/2016   BUN 19 06/29/2016   CO2 28 06/29/2016   TSH 1.85 06/29/2016   PSA 0.47 09/04/2015   INR 0.9 ratio 04/22/2009   HGBA1C 8.0 (H) 06/29/2016   MICROALBUR 55.3 (H) 06/29/2016    Dg Lumbar Spine Complete  Result Date: 09/05/2012 *RADIOLOGY REPORT* Clinical Data: Low back pain and right hip pain.  No known injury. LUMBAR SPINE - COMPLETE 4+ VIEW Comparison: CT 07/16/2005. Findings: There are five non-rib bearing lumbar-type vertebral bodies.  Intervertebral disc spaces are relatively well maintained. Alignment is normal.  No fracture, bony destruction, or pars defect is seen.  SI joints  appear intact. There are minimal early degenerative spondylosis changes are seen. Minimal nonaneurysmal arterial calcification is present. IMPRESSION: Minimal early degenerative spondylosis findings.  Minimal nonaneurysmal arterial calcification Original Report Authenticated By: Onalee Hua Call    Assessment & Plan:   Roen was seen today for hypertension and diabetes.  Diagnoses and all orders for this visit:  Atherosclerosis of native coronary artery of native heart without angina pectoris- he's had no recent episodes of chest pain or shortness of breath, will continue to modify his risk factor with statin therapy, blood sugar control, and blood pressure control. -     Lipid panel; Future  Essential hypertension- his blood pressure is well-controlled, electrolytes and renal function are normal, will continue the beta blocker and ARB. -     Comprehensive metabolic panel; Future -     CBC with Differential/Platelet; Future -     Urinalysis, Routine w reflex microscopic; Future  Type 2 diabetes mellitus with complication, without long-term current use of insulin (HCC)- his A1c is up to 8%, he has not recently  been compliant with Janumet so will restart -     Comprehensive metabolic panel; Future -     Hemoglobin A1c; Future -     Microalbumin / creatinine urine ratio; Future -     Ambulatory referral to Ophthalmology -     sitaGLIPtin-metformin (JANUMET) 50-1000 MG tablet; Take 1 tablet by mouth 2 (two) times daily with a meal.  Pure hyperglyceridemia- his triglycerides are elevated in the 400s, he has not recently been compliant with Lovaza so will restart . I don't think his current symptoms are related to triglyceride induced pancreatitis because he doesn't have abdominal pain and his pancreatic enzymes are normal. -     Lipid panel; Future -     omega-3 acid ethyl esters (LOVAZA) 1 g capsule; Take 2 capsules (2 g total) by mouth 2 (two) times daily.  Hyperlipidemia LDL goal <70- he has  achieved his LDL goal and is doing well on the statin. -     Lipid panel; Future -     TSH; Future  Nausea and vomiting, intractability of vomiting not specified, unspecified vomiting type- his exam is unremarkable and his labs other than the blood in the urine are within normal limits. His pancreatic and liver enzymes are normal. I will order CT renal protocol to see if there is any renal pathology to explain the symptoms. I am also concerned that he may be developing diabetic related gastroparesis. -     Lipase; Future -     Amylase; Future -     CT RENAL STONE STUDY; Future  Atherosclerosis of native artery of both lower extremities with intermittent claudication (HCC)- will screen for PAD -     VAS US LE ART SEG MULTI (Segm&LE Reynauds); Future  Other microscopic hematuria- he has recurrent episodes of nausea and vomiting and blood in his urine, I've ordered a CT renal protocol to see if there is any renal pathology to explain the symptoms. -     CT RENAL STONE STUDY; Future   I have discontinued Mr. Goldner's sitaGLIPtin-metformin. I am also having him start on sitaGLIPtin-metformin. Additionally, I am having him maintain his aspirin, Cholecalciferol, olmesartan, clopidogrel, metoprolol tartrate, rosuvastatin, nitroGLYCERIN, and omega-3 acid ethyl esters.  Meds ordered this encounter  Medications  . omega-3 acid ethyl esters (LOVAZA) 1 g capsule    Sig: Take 2 capsules (2 g total) by mouth 2 (two) times daily.    Dispense:  120 capsule    Refill:  11  . sitaGLIPtin-metformin (JANUMET) 50-1000 MG tablet    Sig: Take 1 tablet by mouth 2 (two) times daily with a meal.    Dispense:  180 tablet    Refill:  1     Follow-up: Return in about 11 weeks (around 09/14/2016).  Sanda Lingerhomas Xareni Kelch, MD

## 2016-07-01 ENCOUNTER — Telehealth: Payer: Self-pay | Admitting: Internal Medicine

## 2016-07-01 NOTE — Telephone Encounter (Signed)
[place]'s [place] ([place] to give test result. She said he is already on omaga  1 gram cap--so not sure why Dr. Yetta BarreJones is sending in another one of this? Inform pt about [facility] Please give her a call before 1 pm about omaga.

## 2016-07-02 ENCOUNTER — Telehealth: Payer: Self-pay

## 2016-07-02 NOTE — Telephone Encounter (Signed)
Patients wife, Bruni Sinkrita states that janumet 50-1000 cost is going to be $500----I have called and talked with [facility]ue janumet 50-500 and only dispense 60 tabs of janumet 50-1000 so that $5 coupon will work for new dosage, patient will still be able to get full amount originally sent, just will pick up smaller amt of tabs each time in order for coupon to work, rita/wife advised

## 2016-07-02 NOTE — Telephone Encounter (Signed)
Spoke to spouse and she stated that she figured it out.

## 2016-07-07 ENCOUNTER — Inpatient Hospital Stay: Admission: RE | Admit: 2016-07-07 | Payer: Managed Care, Other (non HMO) | Source: Ambulatory Visit

## 2016-07-09 ENCOUNTER — Inpatient Hospital Stay: Admission: RE | Admit: 2016-07-09 | Payer: Managed Care, Other (non HMO) | Source: Ambulatory Visit

## 2016-07-09 ENCOUNTER — Telehealth: Payer: Self-pay

## 2016-07-09 NOTE — Telephone Encounter (Signed)
[name] with [facility] called and needed the vas segmental changed to the ABI. Change completed.

## 2016-07-09 NOTE — Telephone Encounter (Signed)
Order [id number]

## 2016-07-09 NOTE — Addendum Note (Signed)
Addended by: Verlan FriendsAIRRIKIER DAVIDSON, Khelani Kops M on: 07/09/2016 03:24 PM   Modules accepted: Orders

## 2016-07-12 ENCOUNTER — Other Ambulatory Visit: Payer: Self-pay | Admitting: Internal Medicine

## 2016-07-12 ENCOUNTER — Ambulatory Visit (INDEPENDENT_AMBULATORY_CARE_PROVIDER_SITE_OTHER)
Admission: RE | Admit: 2016-07-12 | Discharge: 2016-07-12 | Disposition: A | Discharge status: Home / Self Care | Payer: 59 | Source: Ambulatory Visit | Attending: Internal Medicine | Admitting: Internal Medicine

## 2016-07-12 ENCOUNTER — Telehealth: Payer: Self-pay | Admitting: Emergency Medicine

## 2016-07-12 DIAGNOSIS — R3129 Other microscopic hematuria: Secondary | ICD-10-CM | POA: Diagnosis not present

## 2016-07-12 DIAGNOSIS — R112 Nausea with vomiting, unspecified: Secondary | ICD-10-CM | POA: Diagnosis not present

## 2016-07-12 DIAGNOSIS — K8020 Calculus of gallbladder without cholecystitis without obstruction: Secondary | ICD-10-CM | POA: Insufficient documentation

## 2016-07-12 NOTE — Telephone Encounter (Signed)
Pts wife called and wants to know when they take out patients gallbladder can they take the hernia out as well? Please advise and give her a call back thanks.

## 2016-07-13 NOTE — Telephone Encounter (Signed)
This does not require anything from me He should go ahead and see the surgeon

## 2016-07-13 NOTE — Telephone Encounter (Signed)
Pt would like to have hernia surgery at the same time as the gallbladder surgery. [facility] needs notes/orders to do that.   Please advise if this can be done.

## 2016-07-14 NOTE — Telephone Encounter (Signed)
error 

## 2016-07-14 NOTE — Telephone Encounter (Signed)
Spoke with patient's wife to inform her that her husband will need an office visit to address the hernia for the referral to [facility]. The patient's wife said her husband was seen here on 06/29/16 and Dr. Yetta BarreJones found the hernia during that time. Per patient's wife, Dr. Yetta BarreJones asked the Merita NortonKenneth Cline if he wants to address the hernia that day and the patient declined. The patient is scheduled for gallbladder surgery and wants the hernia taken care of during the gallbladder surgery. The surgeon will not address the hernia until the hernia is documented in the system. The patient's wife got very upset because she said she was here with her husband on 06/29/16 and that office visit should be enough. She does not think they should have to come in again. She was mad and yelling. She was making rude comments about Dr. Yetta BarreJones and questioning his ability as a doctor. At the end of the phone call the patient's wife said she was going to find a new doctor and she hung up on me. I informed Dr. Yetta BarreJones and documented conversation in the system.

## 2016-07-14 NOTE — Telephone Encounter (Signed)
Called [facility] and they stated that they would do the hernia surgery at the same time as the gallbladder with an order.

## 2016-07-14 NOTE — Telephone Encounter (Signed)
lmovm for pt and pt's wife.  Pt will need an office visit here for his hernia in order to get the hernia referral added to his current referral to [facility].  His appointment there is for Tues 1/30.

## 2016-07-20 ENCOUNTER — Other Ambulatory Visit: Payer: Self-pay | Admitting: Surgery

## 2016-07-26 NOTE — Pre-Procedure Instructions (Signed)
Avaneesh Pepitone  07/26/2016      [id number] [place], [place] - [address] [place] [address] Phone: [phone] Fax: [phone]    Your procedure is scheduled on Wednesday, February 7th   Report to [facility] Admitting at 2:00 PM             (posted surgery time 4:00 - 5:00 PM)   Call this number if you have problems the MORNING of surgery:  [phone].  Short Stay Center is closed on weekends.   Remember:  Do not eat food or drink liquids after midnight Tuesday.   Take these medicines the morning of surgery with A SIP OF WATER : Metoprolol   Do not wear jewelry - no rings or watches.  Do not wear lotions, colognes or deoderant.  Men may shave face and neck.   Do not bring valuables to the hospital.  [facility] is not responsible for any belongings or valuables.  Contacts, dentures or bridgework may not be worn into surgery.  Leave your suitcase in the car.  After surgery it may be brought to your room. For patients admitted to the hospital, discharge time will be determined by your treatment team.  Patients discharged the day of surgery will not be allowed to drive home, and you will need someone to stay with you for the first 24 hrs after.            How to Manage Your Diabetes Before and After Surgery  Why is it important to control my blood sugar before and after surgery? . Improving blood sugar levels before and after surgery helps healing and can limit problems. . A way of improving blood sugar control is eating a healthy diet by: o  Eating less sugar and carbohydrates o  Increasing activity/exercise o  Talking with your doctor about reaching your blood sugar goals o  . High blood sugars (greater than 180 mg/dL) can raise your risk of infections and slow your recovery, so you will need to focus on controlling your diabetes during the weeks before surgery. . Make sure that  the doctor who takes care of your diabetes knows about your planned surgery including the date and location.  How do I manage my blood sugar before surgery? . Check your blood sugar at least 4 times a day, starting 2 days before surgery, to make sure that the level is not too high or low. o Check your blood sugar the morning of your surgery when you wake up and every 2 hours until you get to the Short Stay unit. o  . If your blood sugar is less than 70 mg/dL, you will need to treat for low blood sugar: o Do not take insulin. o  o Treat a low blood sugar (less than 70 mg/dL) with  cup of clear juice (cranberry or apple), 4 glucose tablets, OR glucose gel. o  o Recheck blood sugar in 15 minutes after treatment (to make sure it is greater than 70 mg/dL). If your blood sugar is not greater than 70 mg/dL on recheck, call [phone] for further instructions. . Report your blood sugar to the short stay nurse when you get to Short Stay.  . If you are admitted to the hospital after surgery: o Your blood sugar will be checked by the staff and you will probably be given insulin after surgery (instead of oral diabetes medicines) to make sure  you have good blood sugar levels. o The goal for blood sugar control after surgery is 80-180 mg/dL.     WHAT DO I DO ABOUT MY DIABETES MEDICATION?   Marland Kitchen. Do not take oral diabetes medicines (pills) the morning of surgery.   Other Instructions:          Patient Signature:  Date:   Nurse Signature:  Date:   Reviewed and Endorsed by [facility] Patient Education Committee, August 2015  Please read over the following fact sheets that you were given. Pain Booklet, MRSA Information and Surgical Site Infection Prevention

## 2016-07-27 ENCOUNTER — Encounter (HOSPITAL_COMMUNITY): Payer: Self-pay

## 2016-07-27 ENCOUNTER — Encounter (HOSPITAL_COMMUNITY)
Admission: RE | Admit: 2016-07-27 | Discharge: 2016-07-27 | Disposition: A | Discharge status: Home / Self Care | Payer: 59 | Source: Ambulatory Visit | Attending: Orthopaedic Surgery | Admitting: Orthopaedic Surgery

## 2016-07-27 DIAGNOSIS — Z79899 Other long term (current) drug therapy: Secondary | ICD-10-CM | POA: Diagnosis not present

## 2016-07-27 DIAGNOSIS — I2510 Atherosclerotic heart disease of native coronary artery without angina pectoris: Secondary | ICD-10-CM | POA: Diagnosis not present

## 2016-07-27 DIAGNOSIS — R319 Hematuria, unspecified: Secondary | ICD-10-CM | POA: Diagnosis not present

## 2016-07-27 DIAGNOSIS — Z833 Family history of diabetes mellitus: Secondary | ICD-10-CM | POA: Diagnosis not present

## 2016-07-27 DIAGNOSIS — K429 Umbilical hernia without obstruction or gangrene: Secondary | ICD-10-CM | POA: Diagnosis not present

## 2016-07-27 DIAGNOSIS — J45909 Unspecified asthma, uncomplicated: Secondary | ICD-10-CM | POA: Diagnosis not present

## 2016-07-27 DIAGNOSIS — Z823 Family history of stroke: Secondary | ICD-10-CM | POA: Diagnosis not present

## 2016-07-27 DIAGNOSIS — G4730 Sleep apnea, unspecified: Secondary | ICD-10-CM | POA: Diagnosis not present

## 2016-07-27 DIAGNOSIS — Z8249 Family history of ischemic heart disease and other diseases of the circulatory system: Secondary | ICD-10-CM | POA: Diagnosis not present

## 2016-07-27 DIAGNOSIS — Z888 Allergy status to other drugs, medicaments and biological substances status: Secondary | ICD-10-CM | POA: Diagnosis not present

## 2016-07-27 DIAGNOSIS — E118 Type 2 diabetes mellitus with unspecified complications: Secondary | ICD-10-CM | POA: Diagnosis not present

## 2016-07-27 DIAGNOSIS — K8010 Calculus of gallbladder with chronic cholecystitis without obstruction: Secondary | ICD-10-CM | POA: Diagnosis present

## 2016-07-27 DIAGNOSIS — I739 Peripheral vascular disease, unspecified: Secondary | ICD-10-CM | POA: Diagnosis not present

## 2016-07-27 DIAGNOSIS — Z87891 Personal history of nicotine dependence: Secondary | ICD-10-CM | POA: Diagnosis not present

## 2016-07-27 DIAGNOSIS — I252 Old myocardial infarction: Secondary | ICD-10-CM | POA: Diagnosis not present

## 2016-07-27 DIAGNOSIS — Z811 Family history of alcohol abuse and dependence: Secondary | ICD-10-CM | POA: Diagnosis not present

## 2016-07-27 HISTORY — DX: Sleep apnea, unspecified: G47.30

## 2016-07-27 HISTORY — DX: Personal history of urinary calculi: Z87.442

## 2016-07-27 LAB — CBC
HCT: 43.2 % (ref 39.0–52.0)
HEMOGLOBIN: 14.2 g/dL (ref 13.0–17.0)
MCH: 26.5 pg (ref 26.0–34.0)
MCHC: 32.9 g/dL (ref 30.0–36.0)
MCV: 80.6 fL (ref 78.0–100.0)
Platelets: 209 10*3/uL (ref 150–400)
RBC: 5.36 MIL/uL (ref 4.22–5.81)
RDW: 14 % (ref 11.5–15.5)
WBC: 9.7 10*3/uL (ref 4.0–10.5)

## 2016-07-27 LAB — BASIC METABOLIC PANEL
ANION GAP: 15 (ref 5–15)
BUN: 19 mg/dL (ref 6–20)
CHLORIDE: 98 mmol/L — AB (ref 101–111)
CO2: 24 mmol/L (ref 22–32)
CREATININE: 0.92 mg/dL (ref 0.61–1.24)
Calcium: 10 mg/dL (ref 8.9–10.3)
GFR calc non Af Amer: >60 mL/min (ref >60)
Glucose, Bld: 198 mg/dL — ABNORMAL HIGH (ref 65–99)
POTASSIUM: 3.8 mmol/L (ref 3.5–5.1)
SODIUM: 137 mmol/L (ref 135–145)

## 2016-07-27 LAB — GLUCOSE, CAPILLARY: Glucose-Capillary: 199 mg/dL — ABNORMAL HIGH (ref 65–99)

## 2016-07-27 NOTE — H&P (Signed)
[name] 07/20/2016 2:06 PM Location: [facility] Patient #: [id number] DOB: [birth year] Married / Language: English / Race: White Male   History of Present Illness David Lam A. Magnus Ivan MD; 07/20/2016 2:44 PM) Patient words: New-Gallbladder.  The patient is a 56 year old male who presents for evaluation of gall stones. This is a pleasant gentleman referred by Dr. Sanda Linger for evaluation of abdominal pain and possible symptomatic cholelithiasis. The patient has a one-month history of postprandial abdominal pain. He describes nausea with intermittent bloating and vomiting. His pain seems to be mostly periumbilical. His bowel movements are normal. This is worse with fatty meals. He also had some hematuria prompting a CT of the abdomen with renal protocol which showed gallstones. His liver function tests were normal. The pain is moderate to severe and is occurring daily   Past Surgical History [name], CMA; 07/20/2016 2:06 PM) Vasectomy   Diagnostic Studies History [name], CMA; 07/20/2016 2:06 PM) Colonoscopy  never  Allergies [name], CMA; 07/20/2016 2:08 PM) Heparins   Medication History [name], CMA; 07/20/2016 2:08 PM) Clopidogrel Bisulfate (75MG  Tablet, Oral) Active. D3-50 (50000UNIT Capsule, Oral) Active. Janumet (50-1000MG  Tablet, Oral) Active. Metoprolol Tartrate (25MG  Tablet, Oral) Active. Nitroglycerin (0.4MG  Tab Sublingual, Sublingual) Active. Olmesartan Medoxomil (20MG  Tablet, Oral) Active. Omega-3-acid Ethyl Esters (1GM Capsule, Oral) Active. Rosuvastatin Calcium (40MG  Tablet, Oral) Active. Medications Reconciled  Social History [name], CMA; 07/20/2016 2:06 PM) Alcohol use  Remotely quit alcohol use. Caffeine use  Carbonated beverages, Coffee. No drug use  Tobacco use  Former smoker.  Family History [name], CMA; 07/20/2016 2:06 PM) Alcohol Abuse  Brother, Father, Son. Cerebrovascular  Accident  Mother, Sister. Diabetes Mellitus  Brother, Mother, Sister. Heart Disease  Brother, Mother, Sister. Heart disease in male family member before age 65  Heart disease in male family member before age 85  Hypertension  Brother, Daughter, Mother, Sister.  Other Problems [name], CMA; 07/20/2016 2:06 PM) Asthma  Back Pain  Cholelithiasis  Diabetes Mellitus  High blood pressure  Hypercholesterolemia  Kidney Stone  Myocardial infarction  Sleep Apnea  Umbilical Hernia Repair     Review of Systems (Chemira Riggs CMA; 07/20/2016 2:06 PM) General Present- Fatigue and Weight Gain. Not Present- Appetite Loss, Chills, Fever, Night Sweats and Weight Loss. Skin Not Present- Change in Wart/Mole, Dryness, Hives, Jaundice, New Lesions, Non-Healing Wounds, Rash and Ulcer. HEENT Not Present- Earache, Hearing Loss, Hoarseness, Nose Bleed, Oral Ulcers, Ringing in the Ears, Seasonal Allergies, Sinus Pain, Sore Throat, Visual Disturbances, Wears glasses/contact lenses and Yellow Eyes. Respiratory Present- Snoring. Not Present- Bloody sputum, Chronic Cough, Difficulty Breathing and Wheezing. Breast Not Present- Breast Mass, Breast Pain, Nipple Discharge and Skin Changes. Cardiovascular Not Present- Chest Pain, Difficulty Breathing Lying Down, Leg Cramps, Palpitations, Rapid Heart Rate, Shortness of Breath and Swelling of Extremities. Gastrointestinal Present- Abdominal Pain, Bloating, Nausea and Vomiting. Not Present- Bloody Stool, Change in Bowel Habits, Chronic diarrhea, Constipation, Difficulty Swallowing, Excessive gas, Gets full quickly at meals, Hemorrhoids, Indigestion and Rectal Pain. Male Genitourinary Present- Change in Urinary Stream. Not Present- Blood in Urine, Frequency, Impotence, Nocturia, Painful Urination, Urgency and Urine Leakage. Musculoskeletal Present- Back Pain. Not Present- Joint Pain, Joint Stiffness, Muscle Pain, Muscle Weakness and Swelling of  Extremities. Neurological Present- Decreased Memory. Not Present- Fainting, Headaches, Numbness, Seizures, Tingling, Tremor, Trouble walking and Weakness. Hematology Present- Blood Thinners and Easy Bruising. Not Present- Excessive bleeding, Gland problems, HIV and Persistent Infections.  Vitals (Chemira Riggs CMA; 07/20/2016 2:07 PM) 07/20/2016  2:07 PM Weight: 204.2 lb Height: 66in Body Surface Area: 2.02 m Body Mass Index: 32.96 kg/m  Temp.: 98.94F(Oral)  Pulse: 89 (Regular)  BP: 130/70 (Sitting, Left Arm, Standard)       Physical Exam ([name] A. Magnus IvanBlackman MD; 07/20/2016 2:45 PM) General Mental Status-Alert. General Appearance-Consistent with stated age. Hydration-Well hydrated. Voice-Normal.  Head and Neck Head-normocephalic, atraumatic with no lesions or palpable masses.  Eye Eyeball - Bilateral-Extraocular movements intact. Sclera/Conjunctiva - Bilateral-No scleral icterus.  Chest and Lung Exam Chest and lung exam reveals -quiet, even and easy respiratory effort with no use of accessory muscles and on auscultation, normal breath sounds, no adventitious sounds and normal vocal resonance. Inspection Chest Wall - Normal. Back - normal.  Cardiovascular Cardiovascular examination reveals -on palpation PMI is normal in location and amplitude, no palpable S3 or S4. Normal cardiac borders., normal heart sounds, regular rate and rhythm with no murmurs, carotid auscultation reveals no bruits and normal pedal pulses bilaterally.  Abdomen Inspection Inspection of the abdomen reveals - No Hernias. Skin - Scar - no surgical scars. Palpation/Percussion Palpation and Percussion of the abdomen reveal - Soft, No Rebound tenderness, No Rigidity (guarding) and No hepatosplenomegaly. Note: He has a tiny, easily reducible umbilical hernia. Tenderness - Right Upper Quadrant. Note: There is moderate tenderness with guarding in the right upper  quadrant. Auscultation Auscultation of the abdomen reveals - Bowel sounds normal.  Neurologic Neurologic evaluation reveals -alert and oriented x 3 with no impairment of recent or remote memory. Mental Status-Normal.  Musculoskeletal Normal Exam - Left-Upper Extremity Strength Normal and Lower Extremity Strength Normal. Normal Exam - Right-Upper Extremity Strength Normal, Lower Extremity Weakness.    Assessment & Plan (Nachmen Mansel A. Magnus IvanBlackman MD; 07/20/2016 2:46 PM) SYMPTOMATIC CHOLELITHIASIS (K80.20) Impression: I suspect he has subacute or chronic cholecystitis with cholelithiasis. I am recommending a laparoscopic cholecystectomy sooner than later. I am going to go ahead and start him on antibiotics preoperatively. I discussed this with the patient and his wife in detail. I gave him literature regarding gallbladder disease and surgery. I discussed laparoscopic cholecystectomy in detail. I discussed the risk of surgery which includes but is not limited to bleeding, infection, injury to surrounding structures, the need to convert to an open procedure, cardiopulmonary problems, postoperative recovery, etc. He understand and wish to proceed with surgery as soon as possible Current Plans Started Augmentin 875-125MG , 1 (one) Tablet two times daily, #14, 07/20/2016, No Refill. UMBILICAL HERNIA (K42.9)

## 2016-07-27 NOTE — Progress Notes (Signed)
LAST DOSE OF PLAVIX WAS 07/20/16.  CARDIOLOGIST - DR. Excell SeltzerOOPER

## 2016-07-28 ENCOUNTER — Ambulatory Visit (HOSPITAL_COMMUNITY)
Admission: RE | Admit: 2016-07-28 | Discharge: 2016-07-28 | Disposition: A | Discharge status: Home / Self Care | Payer: 59 | Source: Ambulatory Visit | Attending: Surgery | Admitting: Surgery

## 2016-07-28 ENCOUNTER — Ambulatory Visit (HOSPITAL_COMMUNITY): Payer: 59 | Admitting: Certified Registered Nurse Anesthetist

## 2016-07-28 ENCOUNTER — Encounter (HOSPITAL_COMMUNITY): Payer: Self-pay | Admitting: *Deleted

## 2016-07-28 ENCOUNTER — Encounter (HOSPITAL_COMMUNITY)
Admission: RE | Disposition: A | Discharge status: Home / Self Care | Payer: Self-pay | Source: Ambulatory Visit | Attending: Surgery

## 2016-07-28 DIAGNOSIS — Z8249 Family history of ischemic heart disease and other diseases of the circulatory system: Secondary | ICD-10-CM | POA: Insufficient documentation

## 2016-07-28 DIAGNOSIS — Z823 Family history of stroke: Secondary | ICD-10-CM | POA: Insufficient documentation

## 2016-07-28 DIAGNOSIS — K8010 Calculus of gallbladder with chronic cholecystitis without obstruction: Secondary | ICD-10-CM | POA: Diagnosis not present

## 2016-07-28 DIAGNOSIS — J45909 Unspecified asthma, uncomplicated: Secondary | ICD-10-CM | POA: Insufficient documentation

## 2016-07-28 DIAGNOSIS — I739 Peripheral vascular disease, unspecified: Secondary | ICD-10-CM | POA: Insufficient documentation

## 2016-07-28 DIAGNOSIS — Z811 Family history of alcohol abuse and dependence: Secondary | ICD-10-CM | POA: Insufficient documentation

## 2016-07-28 DIAGNOSIS — E118 Type 2 diabetes mellitus with unspecified complications: Secondary | ICD-10-CM | POA: Insufficient documentation

## 2016-07-28 DIAGNOSIS — K429 Umbilical hernia without obstruction or gangrene: Secondary | ICD-10-CM | POA: Insufficient documentation

## 2016-07-28 DIAGNOSIS — G4730 Sleep apnea, unspecified: Secondary | ICD-10-CM | POA: Insufficient documentation

## 2016-07-28 DIAGNOSIS — I2510 Atherosclerotic heart disease of native coronary artery without angina pectoris: Secondary | ICD-10-CM | POA: Insufficient documentation

## 2016-07-28 DIAGNOSIS — Z833 Family history of diabetes mellitus: Secondary | ICD-10-CM | POA: Insufficient documentation

## 2016-07-28 DIAGNOSIS — Z79899 Other long term (current) drug therapy: Secondary | ICD-10-CM | POA: Insufficient documentation

## 2016-07-28 DIAGNOSIS — Z87891 Personal history of nicotine dependence: Secondary | ICD-10-CM | POA: Insufficient documentation

## 2016-07-28 DIAGNOSIS — R319 Hematuria, unspecified: Secondary | ICD-10-CM | POA: Insufficient documentation

## 2016-07-28 DIAGNOSIS — Z888 Allergy status to other drugs, medicaments and biological substances status: Secondary | ICD-10-CM | POA: Insufficient documentation

## 2016-07-28 DIAGNOSIS — I252 Old myocardial infarction: Secondary | ICD-10-CM | POA: Insufficient documentation

## 2016-07-28 HISTORY — PX: CHOLECYSTECTOMY: SHX55

## 2016-07-28 HISTORY — PX: UMBILICAL HERNIA REPAIR: SHX196

## 2016-07-28 LAB — GLUCOSE, CAPILLARY
GLUCOSE-CAPILLARY: 199 mg/dL — AB (ref 65–99)
Glucose-Capillary: 189 mg/dL — ABNORMAL HIGH (ref 65–99)

## 2016-07-28 SURGERY — LAPAROSCOPIC CHOLECYSTECTOMY
Anesthesia: General | Site: Abdomen

## 2016-07-28 MED ORDER — BUPIVACAINE-EPINEPHRINE 0.25% -1:200000 IJ SOLN
INTRAMUSCULAR | Status: DC | PRN
Start: 2016-07-28 — End: 2016-07-28

## 2016-07-28 MED ORDER — MIDAZOLAM HCL 2 MG/2ML IJ SOLN
INTRAMUSCULAR | Status: AC
  Filled 2016-07-28: qty 2

## 2016-07-28 MED ORDER — HYDROMORPHONE HCL 1 MG/ML IJ SOLN
0.2500 mg | INTRAMUSCULAR | Status: DC | PRN
  Administered 2016-07-28: 0.25 mg via INTRAVENOUS
  Administered 2016-07-28: 0.5 mg via INTRAVENOUS
  Administered 2016-07-28: 0.25 mg via INTRAVENOUS
  Administered 2016-07-28: 0.5 mg via INTRAVENOUS

## 2016-07-28 MED ORDER — SODIUM CHLORIDE 0.9% FLUSH: 3.0000 mL | INTRAVENOUS | Status: DC | PRN

## 2016-07-28 MED ORDER — FENTANYL CITRATE (PF) 100 MCG/2ML IJ SOLN
INTRAMUSCULAR | Status: AC
  Filled 2016-07-28: qty 2

## 2016-07-28 MED ORDER — LACTATED RINGERS IV SOLN
INTRAVENOUS | Status: DC
  Administered 2016-07-28: 15:00:00 via INTRAVENOUS

## 2016-07-28 MED ORDER — HYDROCODONE-ACETAMINOPHEN 5-325 MG PO TABS: 1.0000 | ORAL_TABLET | ORAL | 0 refills | Status: DC | PRN

## 2016-07-28 MED ORDER — OXYCODONE HCL 5 MG/5ML PO SOLN: 5.0000 mg | Freq: Once | ORAL | Status: AC | PRN

## 2016-07-28 MED ORDER — CEFAZOLIN SODIUM 1 G IJ SOLR
INTRAMUSCULAR | Status: AC
  Filled 2016-07-28: qty 10

## 2016-07-28 MED ORDER — CHLORHEXIDINE GLUCONATE CLOTH 2 % EX PADS: 6.0000 | MEDICATED_PAD | Freq: Once | CUTANEOUS | Status: DC

## 2016-07-28 MED ORDER — MIDAZOLAM HCL 5 MG/5ML IJ SOLN
INTRAMUSCULAR | Status: DC | PRN
  Administered 2016-07-28: 2 mg via INTRAVENOUS

## 2016-07-28 MED ORDER — PROPOFOL 10 MG/ML IV BOLUS
INTRAVENOUS | Status: AC
  Filled 2016-07-28: qty 20

## 2016-07-28 MED ORDER — SUCCINYLCHOLINE CHLORIDE 200 MG/10ML IV SOSY
PREFILLED_SYRINGE | INTRAVENOUS | Status: AC
  Filled 2016-07-28: qty 10

## 2016-07-28 MED ORDER — ROCURONIUM BROMIDE 50 MG/5ML IV SOSY
PREFILLED_SYRINGE | INTRAVENOUS | Status: AC
  Filled 2016-07-28: qty 5

## 2016-07-28 MED ORDER — CEFAZOLIN SODIUM-DEXTROSE 2-4 GM/100ML-% IV SOLN
2.0000 g | INTRAVENOUS | Status: AC
  Administered 2016-07-28: 2 g via INTRAVENOUS

## 2016-07-28 MED ORDER — LACTATED RINGERS IV SOLN
INTRAVENOUS | Status: DC | PRN
  Administered 2016-07-28 (×2): via INTRAVENOUS

## 2016-07-28 MED ORDER — HYDROMORPHONE HCL 1 MG/ML IJ SOLN
INTRAMUSCULAR | Status: DC
Start: 2016-07-28 — End: 2016-07-28
  Filled 2016-07-28: qty 0.5

## 2016-07-28 MED ORDER — SUGAMMADEX SODIUM 200 MG/2ML IV SOLN
INTRAVENOUS | Status: DC | PRN
  Administered 2016-07-28: 200 mg via INTRAVENOUS

## 2016-07-28 MED ORDER — OXYCODONE HCL 5 MG PO TABS
ORAL_TABLET | ORAL | Status: AC
  Filled 2016-07-28: qty 1

## 2016-07-28 MED ORDER — ACETAMINOPHEN 325 MG PO TABS: 650.0000 mg | ORAL_TABLET | ORAL | Status: DC | PRN

## 2016-07-28 MED ORDER — HYDROMORPHONE HCL 1 MG/ML IJ SOLN
INTRAMUSCULAR | Status: AC
  Administered 2016-07-28: 0.25 mg via INTRAVENOUS
  Filled 2016-07-28: qty 0.5

## 2016-07-28 MED ORDER — MORPHINE SULFATE (PF) 2 MG/ML IV SOLN: 1.0000 mg | INTRAVENOUS | Status: DC | PRN

## 2016-07-28 MED ORDER — FENTANYL CITRATE (PF) 100 MCG/2ML IJ SOLN
INTRAMUSCULAR | Status: DC | PRN
  Administered 2016-07-28 (×3): 50 ug via INTRAVENOUS
  Administered 2016-07-28: 25 ug via INTRAVENOUS
  Administered 2016-07-28: 50 ug via INTRAVENOUS
  Administered 2016-07-28: 25 ug via INTRAVENOUS

## 2016-07-28 MED ORDER — ACETAMINOPHEN 650 MG RE SUPP: 650.0000 mg | RECTAL | Status: DC | PRN

## 2016-07-28 MED ORDER — SODIUM CHLORIDE 0.9 % IJ SOLN
INTRAMUSCULAR | Status: AC
  Filled 2016-07-28: qty 10

## 2016-07-28 MED ORDER — SODIUM CHLORIDE 0.9 % IV SOLN: 250.0000 mL | INTRAVENOUS | Status: DC | PRN

## 2016-07-28 MED ORDER — BUPIVACAINE HCL (PF) 0.25 % IJ SOLN
INTRAMUSCULAR | Status: DC | PRN
  Administered 2016-07-28: 20 mL

## 2016-07-28 MED ORDER — ONDANSETRON HCL 4 MG/2ML IJ SOLN: 4.0000 mg | Freq: Four times a day (QID) | INTRAMUSCULAR | Status: DC | PRN

## 2016-07-28 MED ORDER — ONDANSETRON HCL 4 MG/2ML IJ SOLN
INTRAMUSCULAR | Status: AC
  Filled 2016-07-28: qty 4

## 2016-07-28 MED ORDER — PROPOFOL 10 MG/ML IV BOLUS
INTRAVENOUS | Status: DC | PRN
  Administered 2016-07-28: 190 mg via INTRAVENOUS

## 2016-07-28 MED ORDER — LIDOCAINE 2% (20 MG/ML) 5 ML SYRINGE
INTRAMUSCULAR | Status: DC | PRN
  Administered 2016-07-28: 40 mg via INTRAVENOUS

## 2016-07-28 MED ORDER — ESMOLOL HCL 100 MG/10ML IV SOLN
INTRAVENOUS | Status: DC | PRN
  Administered 2016-07-28 (×2): 30 mg via INTRAVENOUS

## 2016-07-28 MED ORDER — EPHEDRINE SULFATE-NACL 50-0.9 MG/10ML-% IV SOSY
PREFILLED_SYRINGE | INTRAVENOUS | Status: DC | PRN
  Administered 2016-07-28: 5 mg via INTRAVENOUS

## 2016-07-28 MED ORDER — OXYCODONE HCL 5 MG PO TABS
5.0000 mg | ORAL_TABLET | Freq: Once | ORAL | Status: AC | PRN
  Administered 2016-07-28: 5 mg via ORAL

## 2016-07-28 MED ORDER — ONDANSETRON HCL 4 MG/2ML IJ SOLN
INTRAMUSCULAR | Status: DC | PRN
  Administered 2016-07-28: 4 mg via INTRAVENOUS

## 2016-07-28 MED ORDER — ROCURONIUM BROMIDE 10 MG/ML (PF) SYRINGE
PREFILLED_SYRINGE | INTRAVENOUS | Status: DC | PRN
  Administered 2016-07-28: 30 mg via INTRAVENOUS

## 2016-07-28 MED ORDER — KETOROLAC TROMETHAMINE 30 MG/ML IJ SOLN
INTRAMUSCULAR | Status: DC | PRN
  Administered 2016-07-28: 30 mg via INTRAVENOUS

## 2016-07-28 MED ORDER — BUPIVACAINE HCL (PF) 0.25 % IJ SOLN
INTRAMUSCULAR | Status: AC
  Filled 2016-07-28: qty 30

## 2016-07-28 MED ORDER — CEFAZOLIN SODIUM-DEXTROSE 2-4 GM/100ML-% IV SOLN
INTRAVENOUS | Status: AC
  Filled 2016-07-28: qty 100

## 2016-07-28 MED ORDER — PHENYLEPHRINE 40 MCG/ML (10ML) SYRINGE FOR IV PUSH (FOR BLOOD PRESSURE SUPPORT)
PREFILLED_SYRINGE | INTRAVENOUS | Status: DC | PRN
  Administered 2016-07-28: 120 ug via INTRAVENOUS

## 2016-07-28 MED ORDER — LIDOCAINE 2% (20 MG/ML) 5 ML SYRINGE
INTRAMUSCULAR | Status: AC
  Filled 2016-07-28: qty 10

## 2016-07-28 MED ORDER — SUGAMMADEX SODIUM 200 MG/2ML IV SOLN
INTRAVENOUS | Status: AC
  Filled 2016-07-28: qty 2

## 2016-07-28 MED ORDER — SODIUM CHLORIDE 0.9% FLUSH: 3.0000 mL | Freq: Two times a day (BID) | INTRAVENOUS | Status: DC

## 2016-07-28 MED ORDER — OXYCODONE HCL 5 MG PO TABS: 5.0000 mg | ORAL_TABLET | ORAL | Status: DC | PRN

## 2016-07-28 MED ORDER — SUCCINYLCHOLINE CHLORIDE 200 MG/10ML IV SOSY
PREFILLED_SYRINGE | INTRAVENOUS | Status: DC | PRN
  Administered 2016-07-28: 120 mg via INTRAVENOUS

## 2016-07-28 MED ORDER — KETOROLAC TROMETHAMINE 30 MG/ML IJ SOLN
INTRAMUSCULAR | Status: AC
  Filled 2016-07-28: qty 1

## 2016-07-28 MED ORDER — HYDROMORPHONE HCL 1 MG/ML IJ SOLN
INTRAMUSCULAR | Status: AC
  Administered 2016-07-28: 0.5 mg via INTRAVENOUS
  Filled 2016-07-28: qty 0.5

## 2016-07-28 MED ORDER — FENTANYL CITRATE (PF) 100 MCG/2ML IJ SOLN
INTRAMUSCULAR | Status: AC
  Filled 2016-07-28: qty 4

## 2016-07-28 MED ORDER — 0.9 % SODIUM CHLORIDE (POUR BTL) OPTIME
TOPICAL | Status: DC | PRN
  Administered 2016-07-28: 1000 mL

## 2016-07-28 MED ORDER — ONDANSETRON HCL 4 MG PO TABS: 4.0000 mg | ORAL_TABLET | Freq: Three times a day (TID) | ORAL | 1 refills | Status: DC | PRN

## 2016-07-28 MED ORDER — SODIUM CHLORIDE 0.9 % IR SOLN
Status: DC | PRN
  Administered 2016-07-28: 1000 mL

## 2016-07-28 MED ORDER — PHENYLEPHRINE 40 MCG/ML (10ML) SYRINGE FOR IV PUSH (FOR BLOOD PRESSURE SUPPORT)
PREFILLED_SYRINGE | INTRAVENOUS | Status: AC
  Filled 2016-07-28: qty 10

## 2016-07-28 SURGICAL SUPPLY — 57 items
ADH SKN CLS APL DERMABOND .7 (GAUZE/BANDAGES/DRESSINGS) ×1
APPLIER CLIP 5 13 M/L LIGAMAX5 (MISCELLANEOUS) ×3
APR CLP MED LRG 5 ANG JAW (MISCELLANEOUS) ×1
BAG SPEC RTRVL LRG 6X4 10 (ENDOMECHANICALS) ×1
BLADE SURG 10 STRL SS (BLADE) ×3 IMPLANT
BLADE SURG 15 STRL LF DISP TIS (BLADE) ×1 IMPLANT
BLADE SURG 15 STRL SS (BLADE) ×3
BLADE SURG ROTATE 9660 (MISCELLANEOUS) ×2 IMPLANT
CANISTER SUCTION 2500CC (MISCELLANEOUS) ×3 IMPLANT
CHLORAPREP W/TINT 26ML (MISCELLANEOUS) ×3 IMPLANT
CLIP APPLIE 5 13 M/L LIGAMAX5 (MISCELLANEOUS) ×1 IMPLANT
COVER SURGICAL LIGHT HANDLE (MISCELLANEOUS) ×3 IMPLANT
DECANTER SPIKE VIAL GLASS SM (MISCELLANEOUS) ×3 IMPLANT
DERMABOND ADVANCED (GAUZE/BANDAGES/DRESSINGS) ×2
DERMABOND ADVANCED .7 DNX12 (GAUZE/BANDAGES/DRESSINGS) ×1 IMPLANT
DRAPE LAPAROTOMY 100X72 PEDS (DRAPES) ×1 IMPLANT
DRAPE UTILITY XL STRL (DRAPES) ×3 IMPLANT
ELECT CAUTERY BLADE 6.4 (BLADE) ×3 IMPLANT
ELECT REM PT RETURN 9FT ADLT (ELECTROSURGICAL) ×3
ELECTRODE REM PT RTRN 9FT ADLT (ELECTROSURGICAL) ×1 IMPLANT
GLOVE BIO SURGEON STRL SZ7 (GLOVE) ×2 IMPLANT
GLOVE BIOGEL PI IND STRL 7.5 (GLOVE) IMPLANT
GLOVE BIOGEL PI INDICATOR 7.5 (GLOVE) ×2
GLOVE SURG SIGNA 7.5 PF LTX (GLOVE) ×3 IMPLANT
GOWN STRL REUS W/ TWL LRG LVL3 (GOWN DISPOSABLE) ×2 IMPLANT
GOWN STRL REUS W/ TWL XL LVL3 (GOWN DISPOSABLE) ×1 IMPLANT
GOWN STRL REUS W/TWL LRG LVL3 (GOWN DISPOSABLE) ×9
GOWN STRL REUS W/TWL XL LVL3 (GOWN DISPOSABLE) ×6
KIT BASIN OR (CUSTOM PROCEDURE TRAY) ×3 IMPLANT
KIT ROOM TURNOVER OR (KITS) ×3 IMPLANT
NDL HYPO 25GX1X1/2 BEV (NEEDLE) ×1 IMPLANT
NEEDLE HYPO 25GX1X1/2 BEV (NEEDLE) ×3 IMPLANT
NS IRRIG 1000ML POUR BTL (IV SOLUTION) ×3 IMPLANT
PACK SURGICAL SETUP 50X90 (CUSTOM PROCEDURE TRAY) ×3 IMPLANT
PAD ARMBOARD 7.5X6 YLW CONV (MISCELLANEOUS) ×3 IMPLANT
PENCIL BUTTON HOLSTER BLD 10FT (ELECTRODE) ×3 IMPLANT
POUCH SPECIMEN RETRIEVAL 10MM (ENDOMECHANICALS) ×3 IMPLANT
SCISSORS LAP 5X35 DISP (ENDOMECHANICALS) ×3 IMPLANT
SET IRRIG TUBING LAPAROSCOPIC (IRRIGATION / IRRIGATOR) ×5 IMPLANT
SLEEVE ENDOPATH XCEL 5M (ENDOMECHANICALS) ×6 IMPLANT
SPECIMEN JAR SMALL (MISCELLANEOUS) ×3 IMPLANT
SPONGE LAP 18X18 X RAY DECT (DISPOSABLE) ×3 IMPLANT
SUT MNCRL AB 4-0 PS2 18 (SUTURE) ×3 IMPLANT
SUT NOVA NAB DX-16 0-1 5-0 T12 (SUTURE) ×3 IMPLANT
SUT VIC AB 2-0 UR6 27 (SUTURE) ×2 IMPLANT
SUT VIC AB 3-0 SH 27 (SUTURE) ×3
SUT VIC AB 3-0 SH 27X BRD (SUTURE) ×1 IMPLANT
SYR CONTROL 10ML LL (SYRINGE) ×3 IMPLANT
TOWEL OR 17X24 6PK STRL BLUE (TOWEL DISPOSABLE) ×3 IMPLANT
TOWEL OR 17X26 10 PK STRL BLUE (TOWEL DISPOSABLE) ×3 IMPLANT
TRAY LAPAROSCOPIC MC (CUSTOM PROCEDURE TRAY) ×3 IMPLANT
TROCAR XCEL BLUNT TIP 100MML (ENDOMECHANICALS) ×3 IMPLANT
TROCAR XCEL NON-BLD 5MMX100MML (ENDOMECHANICALS) ×3 IMPLANT
TUBE CONNECTING 12'X1/4 (SUCTIONS) ×1
TUBE CONNECTING 12X1/4 (SUCTIONS) ×1 IMPLANT
TUBING INSUFFLATION (TUBING) ×3 IMPLANT
YANKAUER SUCT BULB TIP NO VENT (SUCTIONS) ×2 IMPLANT

## 2016-07-28 NOTE — OR Nursing (Signed)
Discharge instructions were discussed with pt, pt wife and pt daughter. Pt has sleep apnea and stated that he is supposed to wear his CPAP at night but does not because he does not sleep well with it. Nurse informed pt that it was very important to wear his CPAP tonight with all of the anesthesia medications that were given to him. Pt and pt wife understood. Denied any further questions regarding discharge teaching. Pt pain level tolerable and was able to tolerate fluids. Pt did void before discharge. Staff wheeled pt out in wheelchair and was assisted into truck. Pt and family were told if they were to have any questions after leaving facility to call Dr. Magnus IvanBlackman office and that they would have someone there to answer there questions.

## 2016-07-28 NOTE — Anesthesia Preprocedure Evaluation (Addendum)
Anesthesia Evaluation  Patient identified by MRN, date of birth, ID band Patient awake    Reviewed: Allergy & Precautions, H&P , NPO status , Patient's Chart, lab work & pertinent test results  Airway Mallampati: II  TM Distance: >3 FB Neck ROM: Full    Dental  (+) Edentulous Upper, Edentulous Lower, Upper Dentures, Lower Dentures, Dental Advisory Given   Pulmonary asthma , sleep apnea , former smoker,    breath sounds clear to auscultation       Cardiovascular hypertension, + CAD, + Past MI and + Peripheral Vascular Disease   Rhythm:regular Rate:Normal     Neuro/Psych    GI/Hepatic Cholelithiasis.  Periods of abdominal pain and nausea.   Endo/Other  diabetes, Type 2  Renal/GU      Musculoskeletal   Abdominal   Peds  Hematology   Anesthesia Other Findings   Reproductive/Obstetrics                            Anesthesia Physical Anesthesia Plan  ASA: III  Anesthesia Plan: General   Post-op Pain Management:    Induction: Intravenous, Rapid sequence and Cricoid pressure planned  Airway Management Planned: Oral ETT  Additional Equipment:   Intra-op Plan:   Post-operative Plan: Extubation in OR  Informed Consent: I have reviewed the patients History and Physical, chart, labs and discussed the procedure including the risks, benefits and alternatives for the proposed anesthesia with the patient or authorized representative who has indicated his/her understanding and acceptance.   Dental advisory given  Plan Discussed with: CRNA, Anesthesiologist and Surgeon  Anesthesia Plan Comments:        Anesthesia Quick Evaluation

## 2016-07-28 NOTE — Interval H&P Note (Signed)
History and Physical Interval Note:no change in H and P  07/28/2016 3:05 PM  David NortonKenneth Riggs  has presented today for surgery, with the diagnosis of symtptomatic gallstones, umbilical hernia  The various methods of treatment have been discussed with the patient and family. After consideration of risks, benefits and other options for treatment, the patient has consented to  Procedure(s): LAPAROSCOPIC CHOLECYSTECTOMY (N/A) HERNIA REPAIR UMBILICAL ADULT (N/A) as a surgical intervention .  The patient's history has been reviewed, patient examined, no change in status, stable for surgery.  I have reviewed the patient's chart and labs.  Questions were answered to the patient's satisfaction.     [name] A

## 2016-07-28 NOTE — Transfer of Care (Signed)
Immediate Anesthesia Transfer of Care Note  Patient: David NortonKenneth Riggs  Procedure(s) Performed: Procedure(s): LAPAROSCOPIC CHOLECYSTECTOMY (N/A) HERNIA REPAIR UMBILICAL ADULT (N/A)  Patient Location: PACU  Anesthesia Type:General  Level of Consciousness: awake, alert , oriented and patient cooperative  Airway & Oxygen Therapy: Patient Spontanous Breathing and Patient connected to face mask oxygen  Post-op Assessment: Report given to RN and Post -op Vital signs reviewed and stable  Post vital signs: Reviewed and stable  Last Vitals:  Vitals:   07/28/16 1412  BP: (!) 153/99  Pulse: (!) 102  Resp: 18  Temp: 36.8 C    Last Pain:  Vitals:   07/28/16 1434  TempSrc:   PainSc: 5       Patients Stated Pain Goal: 3 (07/28/16 1434)  Complications: No apparent anesthesia complications

## 2016-07-28 NOTE — Op Note (Addendum)
Laparoscopic Cholecystectomy Procedure Note Umbilical hernia repair  Indications: This patient presents with symptomatic gallbladder disease and will undergo laparoscopic cholecystectomy.  Pre-operative Diagnosis: symptomatic cholelithiasis with cholecystitis  Post-operative Diagnosis: Same  Surgeon: Abigail Miyamoto A   Assistants: 0  Anesthesia: General endotracheal anesthesia  ASA Class: 2  Procedure Details  The patient was seen again in the Holding Room. The risks, benefits, complications, treatment options, and expected outcomes were discussed with the patient. The possibilities of reaction to medication, pulmonary aspiration, perforation of viscus, bleeding, recurrent infection, finding a normal gallbladder, the need for additional procedures, failure to diagnose a condition, the possible need to convert to an open procedure, and creating a complication requiring transfusion or operation were discussed with the patient. The likelihood of improving the patient's symptoms with return to their baseline status is good.  The patient and/or family concurred with the proposed plan, giving informed consent. The site of surgery properly noted. The patient was taken to Operating Room, identified as [name] and the procedure verified as Laparoscopic Cholecystectomy with Intraoperative Cholangiogram. A Time Out was held and the above information confirmed.  Prior to the induction of general anesthesia, antibiotic prophylaxis was administered. General endotracheal anesthesia was then administered and tolerated well. After the induction, the abdomen was prepped with Chloraprep and draped in sterile fashion. The patient was positioned in the supine position.  Local anesthetic agent was injected into the skin near the umbilicus and an incision made. We dissected down to the abdominal fascia with blunt dissection.  The fascia was incised vertically and we entered the peritoneal cavity bluntly.  A  pursestring suture of 0-Vicryl was placed around the fascial opening that included a small umbilical hernia..  The Hasson cannula was inserted and secured with the stay suture.  Pneumoperitoneum was then created with CO2 and tolerated well without any adverse changes in the patient's vital signs. A 5-mm port was placed in the subxiphoid position.  Two 5-mm ports were placed in the right upper quadrant. All skin incisions were infiltrated with a local anesthetic agent before making the incision and placing the trocars.   We positioned the patient in reverse Trendelenburg, tilted slightly to the patient's left.  The patient had a fatty liver. I saw no other intra abdominal abnormalities.  The gallbladder was identified, the fundus grasped and retracted cephalad. It appeared acutely inflamed. Adhesions were lysed bluntly and with the electrocautery where indicated, taking care not to injure any adjacent organs or viscus. The infundibulum was grasped and retracted laterally, exposing the peritoneum overlying the triangle of Calot. This was then divided and exposed in a blunt fashion. The cystic duct was clearly identified and bluntly dissected circumferentially. The duct was tiny.  A critical view of the cystic duct and cystic artery was obtained.  The cystic duct was then ligated with clips and divided. The cystic artery was, dissected free, ligated with clips and divided as well.   The gallbladder was dissected from the liver bed in retrograde fashion with the electrocautery. The gallbladder was removed and placed in an Endocatch sac. The liver bed was irrigated and inspected. Hemostasis was achieved with the electrocautery. Copious irrigation was utilized and was repeatedly aspirated until clear.  The gallbladder and Endocatch sac were then removed through the umbilical port site.  The pursestring suture was used to close the umbilical fascia.  I placed another 0 vicryl suture at the umbilical incision to close  the small hernia defect.  We again inspected the right upper  quadrant for hemostasis.  Pneumoperitoneum was released as we removed the trocars.  4-0 Monocryl was used to close the skin.   Skin glue was then applied. The patient was then extubated and brought to the recovery room in stable condition. Instrument, sponge, and needle counts were correct at closure and at the conclusion of the case.   Findings: Cholecystitis with Cholelithiasis  Estimated Blood Loss: Minimal         Drains: 0         Specimens: Gallbladder           Complications: None; patient tolerated the procedure well.         Disposition: PACU - hemodynamically stable.         Condition: stable

## 2016-07-28 NOTE — Discharge Instructions (Signed)
[facility] ______[facility] LAPAROSCOPIC SURGERY: POST OP INSTRUCTIONS Always review your discharge instruction sheet given to you by the facility where your surgery was performed. IF YOU HAVE DISABILITY OR FAMILY LEAVE FORMS, YOU MUST BRING THEM TO THE OFFICE FOR PROCESSING.   DO NOT GIVE THEM TO YOUR DOCTOR.  1. A prescription for pain medication may be given to you upon discharge.  Take your pain medication as prescribed, if needed.  If narcotic pain medicine is not needed, then you may take acetaminophen (Tylenol) or ibuprofen (Advil) as needed. 2. Take your usually prescribed medications unless otherwise directed. 3. If you need a refill on your pain medication, please contact your pharmacy.  They will contact our office to request authorization. Prescriptions will not be filled after 5pm or on week-ends. 4. You should follow a light diet the first few days after arrival home, such as soup and crackers, etc.  Be sure to include lots of fluids daily. 5. Most patients will experience some swelling and bruising in the area of the incisions.  Ice packs will help.  Swelling and bruising can take several days to resolve.  6. It is common to experience some constipation if taking pain medication after surgery.  Increasing fluid intake and taking a stool softener (such as Colace) will usually help or prevent this problem from occurring.  A mild laxative (Milk of Magnesia or Miralax) should be taken according to package instructions if there are no bowel movements after 48 hours. 7. Unless discharge instructions indicate otherwise, you may remove your bandages 24-48 hours after surgery, and you may shower at that time.  You may have steri-strips (small skin tapes) in place directly over the incision.  These strips should be left on the skin for 7-10 days.  If your surgeon used skin glue on the incision, you may shower in 24 hours.  The glue will flake off over the next 2-3 weeks.  Any sutures or  staples will be removed at the office during your follow-up visit. 8. ACTIVITIES:  You may resume regular (light) daily activities beginning the next day--such as daily self-care, walking, climbing stairs--gradually increasing activities as tolerated.  You may have sexual intercourse when it is comfortable.  Refrain from any heavy lifting or straining until approved by your doctor. a. You may drive when you are no longer taking prescription pain medication, you can comfortably wear a seatbelt, and you can safely maneuver your car and apply brakes. b. RETURN TO WORK:  __________________________________________________________ 9. You should see your doctor in the office for a follow-up appointment approximately 2-3 weeks after your surgery.  Make sure that you call for this appointment within a day or two after you arrive home to insure a convenient appointment time. 10. OTHER INSTRUCTIONS:OK TO SHOWER TOMORROW 11. NO LIFTING MORE THAN 15 POUNDS FOR 4 WEEKS __________________________________________________________________________________________________________________________ __________________________________________________________________________________________________________________________ WHEN TO CALL YOUR DOCTOR: 1. Fever over 101.0 2. Inability to urinate 3. Continued bleeding from incision. 4. Increased pain, redness, or drainage from the incision. 5. Increasing abdominal pain  The clinic staff is available to answer your questions during regular business hours.  Please dont hesitate to call and ask to speak to one of the nurses for clinical concerns.  If you have a medical emergency, go to the nearest emergency room or call 911.  [facility] from [facility] is always on call at the hospital. [address], [address], [place], [place]  [id number] ? [address] [address], [place], [place]  [address] [phone] ? [phone] ? FAX [phone] Web site:  [email or web address]

## 2016-07-28 NOTE — Anesthesia Procedure Notes (Signed)
Procedure Name: Intubation Date/Time: 07/28/2016 3:31 PM Performed by: Annabelle HarmanSMITH, Lalisa Kiehn A Pre-anesthesia Checklist: Patient identified, Emergency Drugs available, Suction available and Patient being monitored Patient Re-evaluated:Patient Re-evaluated prior to inductionOxygen Delivery Method: Circle system utilized Preoxygenation: Pre-oxygenation with 100% oxygen Intubation Type: IV induction, Rapid sequence and Cricoid Pressure applied Laryngoscope Size: Miller and 2 Grade View: Grade I Tube type: Oral Tube size: 8.0 mm Number of attempts: 1 Airway Equipment and Method: Stylet Placement Confirmation: ETT inserted through vocal cords under direct vision,  positive ETCO2 and breath sounds checked- equal and bilateral Secured at: 23 cm Tube secured with: Tape Dental Injury: Teeth and Oropharynx as per pre-operative assessment

## 2016-07-29 ENCOUNTER — Encounter (HOSPITAL_COMMUNITY): Payer: Self-pay | Admitting: Surgery

## 2016-07-29 NOTE — Anesthesia Postprocedure Evaluation (Signed)
Anesthesia Post Note  Patient: David NortonKenneth Riggs  Procedure(s) Performed: Procedure(s) (LRB): LAPAROSCOPIC CHOLECYSTECTOMY (N/A) HERNIA REPAIR UMBILICAL ADULT (N/A)  Patient location during evaluation: PACU Anesthesia Type: General Level of consciousness: awake and alert and patient cooperative Pain management: pain level controlled Vital Signs Assessment: post-procedure vital signs reviewed and stable Respiratory status: spontaneous breathing and respiratory function stable Cardiovascular status: stable Anesthetic complications: no       Last Vitals:  Vitals:   07/28/16 1845 07/28/16 1900  BP: 133/82 (!) 129/92  Pulse:    Resp:    Temp:      Last Pain:  Vitals:   07/28/16 1730  TempSrc:   PainSc: Asleep                 Kehaulani Fruin S

## 2016-08-07 ENCOUNTER — Emergency Department (HOSPITAL_COMMUNITY): Payer: 59

## 2016-08-07 ENCOUNTER — Encounter (HOSPITAL_COMMUNITY): Payer: Self-pay

## 2016-08-07 ENCOUNTER — Emergency Department (HOSPITAL_COMMUNITY)
Admission: EM | Admit: 2016-08-07 | Discharge: 2016-08-08 | Disposition: A | Discharge status: Home / Self Care | Payer: 59 | Attending: Emergency Medicine | Admitting: Emergency Medicine

## 2016-08-07 DIAGNOSIS — I2510 Atherosclerotic heart disease of native coronary artery without angina pectoris: Secondary | ICD-10-CM | POA: Diagnosis not present

## 2016-08-07 DIAGNOSIS — Z87891 Personal history of nicotine dependence: Secondary | ICD-10-CM | POA: Insufficient documentation

## 2016-08-07 DIAGNOSIS — Z955 Presence of coronary angioplasty implant and graft: Secondary | ICD-10-CM | POA: Diagnosis not present

## 2016-08-07 DIAGNOSIS — Z7984 Long term (current) use of oral hypoglycemic drugs: Secondary | ICD-10-CM | POA: Insufficient documentation

## 2016-08-07 DIAGNOSIS — Z7982 Long term (current) use of aspirin: Secondary | ICD-10-CM | POA: Insufficient documentation

## 2016-08-07 DIAGNOSIS — E119 Type 2 diabetes mellitus without complications: Secondary | ICD-10-CM | POA: Insufficient documentation

## 2016-08-07 DIAGNOSIS — I10 Essential (primary) hypertension: Secondary | ICD-10-CM | POA: Insufficient documentation

## 2016-08-07 DIAGNOSIS — G8918 Other acute postprocedural pain: Secondary | ICD-10-CM | POA: Diagnosis not present

## 2016-08-07 DIAGNOSIS — Z79899 Other long term (current) drug therapy: Secondary | ICD-10-CM | POA: Insufficient documentation

## 2016-08-07 DIAGNOSIS — R1011 Right upper quadrant pain: Secondary | ICD-10-CM | POA: Insufficient documentation

## 2016-08-07 DIAGNOSIS — J45909 Unspecified asthma, uncomplicated: Secondary | ICD-10-CM | POA: Insufficient documentation

## 2016-08-07 DIAGNOSIS — R109 Unspecified abdominal pain: Secondary | ICD-10-CM

## 2016-08-07 LAB — CBC
HEMATOCRIT: 43.5 % (ref 39.0–52.0)
HEMOGLOBIN: 14.5 g/dL (ref 13.0–17.0)
MCH: 26.8 pg (ref 26.0–34.0)
MCHC: 33.3 g/dL (ref 30.0–36.0)
MCV: 80.4 fL (ref 78.0–100.0)
Platelets: 244 10*3/uL (ref 150–400)
RBC: 5.41 MIL/uL (ref 4.22–5.81)
RDW: 13.7 % (ref 11.5–15.5)
WBC: 9.5 10*3/uL (ref 4.0–10.5)

## 2016-08-07 LAB — LIPASE, BLOOD: LIPASE: 29 U/L (ref 11–51)

## 2016-08-07 LAB — COMPREHENSIVE METABOLIC PANEL
ALBUMIN: 4.4 g/dL (ref 3.5–5.0)
ALT: 35 U/L (ref 17–63)
ANION GAP: 13 (ref 5–15)
AST: 28 U/L (ref 15–41)
Alkaline Phosphatase: 47 U/L (ref 38–126)
BUN: 14 mg/dL (ref 6–20)
CO2: 25 mmol/L (ref 22–32)
Calcium: 10.6 mg/dL — ABNORMAL HIGH (ref 8.9–10.3)
Chloride: 102 mmol/L (ref 101–111)
Creatinine, Ser: 0.94 mg/dL (ref 0.61–1.24)
GFR calc Af Amer: >60 mL/min (ref >60)
Glucose, Bld: 159 mg/dL — ABNORMAL HIGH (ref 65–99)
POTASSIUM: 4.1 mmol/L (ref 3.5–5.1)
Sodium: 140 mmol/L (ref 135–145)
Total Bilirubin: 0.7 mg/dL (ref 0.3–1.2)
Total Protein: 7.9 g/dL (ref 6.5–8.1)

## 2016-08-07 LAB — I-STAT CG4 LACTIC ACID, ED
LACTIC ACID, VENOUS: 1.6 mmol/L (ref 0.5–1.9)
Lactic Acid, Venous: 2.18 mmol/L (ref 0.5–1.9)

## 2016-08-07 MED ORDER — SODIUM CHLORIDE 0.9 % IV BOLUS (SEPSIS)
1000.0000 mL | Freq: Once | INTRAVENOUS | Status: AC
  Administered 2016-08-07: 1000 mL via INTRAVENOUS

## 2016-08-07 MED ORDER — ONDANSETRON HCL 4 MG/2ML IJ SOLN
4.0000 mg | Freq: Once | INTRAMUSCULAR | Status: AC
  Administered 2016-08-07: 4 mg via INTRAVENOUS
  Filled 2016-08-07: qty 2

## 2016-08-07 MED ORDER — FENTANYL CITRATE (PF) 100 MCG/2ML IJ SOLN
100.0000 ug | Freq: Once | INTRAMUSCULAR | Status: AC
  Administered 2016-08-07: 100 ug via INTRAVENOUS
  Filled 2016-08-07: qty 2

## 2016-08-07 MED ORDER — IOPAMIDOL (ISOVUE-300) INJECTION 61%
INTRAVENOUS | Status: AC
  Administered 2016-08-07: 100 mL
  Filled 2016-08-07: qty 100

## 2016-08-07 NOTE — ED Provider Notes (Signed)
[facility]-EMERGENCY DEPT Provider Note   CSN: [id number] Arrival date & time: 08/07/16  1951     History   Chief Complaint Chief Complaint  Patient presents with  . Abdominal Pain  . Back Pain    HPI [name] is a 56 y.o. male.  [name] is a 56 y.o. Male who presents to the emergency department complaining of right-sided abdominal pain that is worsened since a laparoscopic cholecystectomy over a week ago. Patient reports he had a laparoscopic cholecystectomy by Dr. Magnus Ivan on 07/28/2016. Patient reports initially he was feeling well until about 5 days ago he began having increasing abdominal pain that is worse with movement. He reports right upper quadrant abdominal pain that radiates to his right back. He has taken nothing for treatment of his symptoms today. He reports taking pain medication for two days after the surgery and nothing since then.  He denies any nausea or vomiting. He does report loose stools since the cholecystectomy. No hematemesis or hematochezia. Sent by his surgeon today to ER. Patient denies fevers, vomiting, nausea, urinary symptoms, rashes, coughing, chest pain, shortness of breath or recent illness.   The history is provided by the patient and medical records. No language interpreter was used.  Abdominal Pain   Associated symptoms include diarrhea. Pertinent negatives include fever, nausea, vomiting, constipation, dysuria, frequency and headaches.  Back Pain   Associated symptoms include abdominal pain. Pertinent negatives include no chest pain, no fever, no headaches and no dysuria.    Past Medical History:  Diagnosis Date  . Asthma   . Coronary artery disease    s/p BMS to RCA and CFX. s/p DES to RCA 2/2 ISR 2010  . Diabetes mellitus without complication (HCC)   . History of kidney stones    20 yrs ago   . Hyperlipidemia   . Hypertension   . Old myocardial infarction   . Paroxysmal ventricular tachycardia (HCC)   . Sleep apnea    not  using cpap    5-6 yrs     Patient Active Problem List   Diagnosis Date Noted  . Calculus of gallbladder without cholecystitis without obstruction 07/12/2016  . Nausea and vomiting 06/29/2016  . Atherosclerosis of native artery of both lower extremities with intermittent claudication (HCC) 06/29/2016  . Other microscopic hematuria 06/29/2016  . Routine general medical examination at a health care facility 08/21/2013  . Type II diabetes mellitus with manifestations (HCC) 09/05/2012  . Low back pain radiating to right leg 09/05/2012  . Vitamin D deficiency 09/05/2012  . Pure hyperglyceridemia 09/05/2012  . Tobacco abuse 12/23/2010  . OBSTRUCTIVE SLEEP APNEA 07/10/2010  . Cardiomegaly 06/24/2010  . Hyperlipidemia LDL goal <70 04/15/2009  . Essential hypertension 04/15/2009  . Coronary atherosclerosis 04/15/2009    Past Surgical History:  Procedure Laterality Date  . CARDIAC CATHETERIZATION    . CHOLECYSTECTOMY N/A 07/28/2016   Procedure: LAPAROSCOPIC CHOLECYSTECTOMY;  Surgeon: Abigail Miyamoto, MD;  Location: [facility] OR;  Service: General;  Laterality: N/A;  . CORONARY STENT PLACEMENT  2002, 2007, 2010   right  . UMBILICAL HERNIA REPAIR N/A 07/28/2016   Procedure: HERNIA REPAIR UMBILICAL ADULT;  Surgeon: Abigail Miyamoto, MD;  Location: [facility] OR;  Service: General;  Laterality: N/A;       Home Medications    Prior to Admission medications   Medication Sig Start Date End Date Taking? Authorizing Provider  aspirin 81 MG tablet Take 81 mg by mouth daily.     Yes Historical Provider, MD  Cholecalciferol (D3-50) 50000 units capsule Take 1 capsule (50,000 Units total) by mouth daily. Overdue for yearly physical w/labs must see md for refills Patient taking differently: Take 50,000 Units by mouth every 7 (seven) days. Wednesdays 09/04/15  Yes Etta Grandchild, MD  clopidogrel (PLAVIX) 75 MG tablet Take 1 tablet (75 mg total) by mouth daily. 12/01/15  Yes Tonny Bollman, MD  metoprolol tartrate  (LOPRESSOR) 25 MG tablet Take 1 tablet (25 mg total) by mouth 2 (two) times daily. 12/01/15  Yes Tonny Bollman, MD  nitroGLYCERIN (NITROSTAT) 0.4 MG SL tablet Place 1 tablet (0.4 mg total) under the tongue every 5 (five) minutes as needed for chest pain. Patient taking differently: Place 0.4 mg under the tongue every 5 (five) minutes as needed for chest pain. Chest pain 12/01/15  Yes Tonny Bollman, MD  olmesartan (BENICAR) 20 MG tablet Take 1 tablet (20 mg total) by mouth daily. 09/04/15  Yes Etta Grandchild, MD  omega-3 acid ethyl esters (LOVAZA) 1 g capsule Take 2 capsules (2 g total) by mouth 2 (two) times daily. 06/29/16  Yes Etta Grandchild, MD  ondansetron (ZOFRAN) 4 MG tablet Take 1 tablet (4 mg total) by mouth every 8 (eight) hours as needed for nausea or vomiting. 07/28/16  Yes Abigail Miyamoto, MD  rosuvastatin (CRESTOR) 40 MG tablet Take 1 tablet (40 mg total) by mouth daily. Patient taking differently: Take 40 mg by mouth every evening.  12/01/15  Yes Tonny Bollman, MD  sitaGLIPtin-metformin (JANUMET) 50-1000 MG tablet Take 1 tablet by mouth 2 (two) times daily with a meal. 06/29/16  Yes Etta Grandchild, MD  amoxicillin-clavulanate (AUGMENTIN) 875-125 MG tablet Take 1 tablet by mouth 2 (two) times daily. 7 day course started 07-20-2016    Historical Provider, MD  HYDROcodone-acetaminophen (NORCO/VICODIN) 5-325 MG tablet Take 1-2 tablets by mouth every 6 (six) hours as needed for moderate pain or severe pain. 08/08/16   Everlene Farrier, PA-C    Family History Family History  Problem Relation Age of Onset  . Heart disease Mother   . Diabetes Mother   . Alzheimer's disease Mother   . Diabetes Sister   . Heart disease Brother     bypass for both  . Heart disease Sister     bypass  . Peripheral vascular disease Sister   . Kidney disease Sister   . Cancer Neg Hx   . Alcohol abuse Neg Hx   . Early death Neg Hx   . Hyperlipidemia Neg Hx   . Hypertension Neg Hx   . Stroke Neg Hx     Social  History Social History  Substance Use Topics  . Smoking status: Former Smoker    Packs/day: 1.50    Years: 30.00    Types: Cigarettes    Quit date: 08/22/2011  . Smokeless tobacco: Never Used     Comment: currently smoking 1 ppd.   . Alcohol use No     Allergies   Heparin   Review of Systems Review of Systems  Constitutional: Negative for chills and fever.  HENT: Negative for congestion and sore throat.   Eyes: Negative for visual disturbance.  Respiratory: Negative for cough and shortness of breath.   Cardiovascular: Negative for chest pain.  Gastrointestinal: Positive for abdominal pain and diarrhea. Negative for abdominal distention, blood in stool, constipation, nausea and vomiting.  Genitourinary: Negative for difficulty urinating, dysuria, frequency and urgency.  Musculoskeletal: Positive for back pain. Negative for neck pain.  Skin: Negative for rash.  Neurological: Negative for headaches.     Physical Exam Updated Vital Signs BP 118/73   Pulse 73   Temp 97.9 F (36.6 C) (Oral)   Resp 16   Ht 5\' 6"  (1.676 m)   Wt 93 kg   SpO2 94%   BMI 33.09 kg/m   Physical Exam  Constitutional: He appears well-developed and well-nourished. No distress.  Nontoxic appearing.  HENT:  Head: Normocephalic and atraumatic.  Mouth/Throat: Oropharynx is clear and moist.  Mucous membranes are moist.  Eyes: Conjunctivae are normal. Pupils are equal, round, and reactive to light. Right eye exhibits no discharge. Left eye exhibits no discharge.  Neck: Neck supple.  Cardiovascular: Normal rate, regular rhythm, normal heart sounds and intact distal pulses.  Exam reveals no gallop and no friction rub.   No murmur heard. Pulmonary/Chest: Effort normal and breath sounds normal. No respiratory distress. He has no wheezes. He has no rales.  Lungs CTA bilaterally.   Abdominal: Soft. He exhibits no distension and no mass. There is tenderness. There is no rebound and no guarding.     Abdomen is soft. Bowel sounds are hypoactive. Patient has mid right sided abdominal tenderness to palpation. No CVA TTP.  Laparoscopic incisions are clean and no evidence of infection.  Musculoskeletal: He exhibits no edema.  No LE edema or TTP.   Lymphadenopathy:    He has no cervical adenopathy.  Neurological: He is alert. Coordination normal.  Skin: Skin is warm and dry. Capillary refill takes less than 2 seconds. No rash noted. He is not diaphoretic. No erythema. No pallor.  Psychiatric: He has a normal mood and affect. His behavior is normal.  Nursing note and vitals reviewed.    ED Treatments / Results  Labs (all labs ordered are listed, but only abnormal results are displayed) Labs Reviewed  COMPREHENSIVE METABOLIC PANEL - Abnormal; Notable for the following:       Result Value   Glucose, Bld 159 (*)    Calcium 10.6 (*)    All other components within normal limits  I-STAT CG4 LACTIC ACID, ED - Abnormal; Notable for the following:    Lactic Acid, Venous 2.18 (*)    All other components within normal limits  CULTURE, BLOOD (ROUTINE X 2)  CULTURE, BLOOD (ROUTINE X 2)  LIPASE, BLOOD  CBC  URINALYSIS, ROUTINE W REFLEX MICROSCOPIC  I-STAT CG4 LACTIC ACID, ED    EKG  EKG Interpretation None       Radiology Dg Chest 2 View  Result Date: 08/08/2016 CLINICAL DATA:  Initial evaluation for acute right upper quadrant pain status post surgery. EXAM: CHEST  2 VIEW COMPARISON:  Prior radiograph from 07/10/2010. FINDINGS: The cardiac and mediastinal silhouettes are stable in size and contour, and remain within normal limits. Suspected underlying emphysema. No airspace consolidation, pleural effusion, or pulmonary edema is identified. There is no pneumothorax. No acute osseous abnormality identified. IMPRESSION: No active cardiopulmonary disease. Electronically Signed   By: Rise Mu M.D.   On: 08/08/2016 00:29   Ct Abdomen Pelvis W Contrast  Result Date:  08/07/2016 CLINICAL DATA:  Right upper quadrant abdominal pain radiating to the back for 4 days. History of laparoscopic cholecystectomy and umbilical hernia repair 1 week ago. EXAM: CT ABDOMEN AND PELVIS WITH CONTRAST TECHNIQUE: Multidetector CT imaging of the abdomen and pelvis was performed using the standard protocol following bolus administration of intravenous contrast. CONTRAST:  ISOVUE-300 IOPAMIDOL (ISOVUE-300) INJECTION 61% COMPARISON:  07/12/2016 FINDINGS: Lower chest: Minimal dependent changes in  the lung bases. Coronary artery calcifications. Hepatobiliary: Surgical absence of the gallbladder. No bile duct dilatation. No fluid collections or infiltrative changes demonstrated in the gallbladder fossa. Diffuse fatty infiltration of the liver. No focal liver lesions. Pancreas: Unremarkable. No pancreatic ductal dilatation or surrounding inflammatory changes. Spleen: Normal in size without focal abnormality. Adrenals/Urinary Tract: Adrenal glands are unremarkable. Kidneys are normal, without renal calculi, focal lesion, or hydronephrosis. Bladder is unremarkable. Stomach/Bowel: Stomach is within normal limits. Appendix appears normal. No evidence of bowel wall thickening, distention, or inflammatory changes. Vascular/Lymphatic: Aortic atherosclerosis. No enlarged abdominal or pelvic lymph nodes. Reproductive: Borderline prostate size at 3.8 cm diameter. Prostate calcifications are present. Other: There is focal infiltration in the anterior abdominal wall at the level of the umbilicus, likely representing postoperative change. No loculated fluid collections. No free fluid or free air in the abdomen. Abdominal wall musculature appears intact. Musculoskeletal: Degenerative changes in the spine. No destructive bone lesions. IMPRESSION: Postoperative cholecystectomy. No fluid or infiltration in the gallbladder fossa. No free fluid in the abdomen. Focal infiltration in the anterior abdominal wall around the  umbilical region is likely postoperative. No loculated collection to suggest abscess. Diffuse fatty infiltration of the liver. No evidence of bowel obstruction or inflammation. Electronically Signed   By: Burman Nieves M.D.   On: 08/07/2016 22:46    Procedures Procedures (including critical care time)  Medications Ordered in ED Medications  sodium chloride 0.9 % bolus 1,000 mL (0 mLs Intravenous Stopped 08/07/16 2337)  ondansetron (ZOFRAN) injection 4 mg (4 mg Intravenous Given 08/07/16 2251)  fentaNYL (SUBLIMAZE) injection 100 mcg (100 mcg Intravenous Given 08/07/16 2251)  iopamidol (ISOVUE-300) 61 % injection (100 mLs  Contrast Given 08/07/16 2225)  HYDROcodone-acetaminophen (NORCO/VICODIN) 5-325 MG per tablet 2 tablet (2 tablets Oral Given 08/08/16 0051)     Initial Impression / Assessment and Plan / ED Course  I have reviewed the triage vital signs and the nursing notes.  Pertinent labs & imaging results that were available during my care of the patient were reviewed by me and considered in my medical decision making (see chart for details).    This is a 56 y.o. Male who presents to the emergency department complaining of right-sided abdominal pain that is worsened since a laparoscopic cholecystectomy over a week ago. Patient reports he had a laparoscopic cholecystectomy by Dr. Magnus Ivan on 07/28/2016. Patient reports initially he was feeling well until about 5 days ago he began having increasing abdominal pain that is worse with movement. He reports right upper quadrant abdominal pain that radiates to his right back. He has taken nothing for treatment of his symptoms today. He reports taking pain medication for two days after the surgery and nothing since then.  He denies any nausea or vomiting. He does report loose stools since the cholecystectomy. He denies any fevers, coughing, shortness of breath, chest pain, or trouble breathing.  On exam the patient is afebrile nontoxic appearing.  Patient's abdomen is soft and he has mid right abdominal tenderness to palpation. No CVA tenderness. His laparoscopic incisions are clean and free of infection. His lungs are clear to auscultation bilaterally. No increased work of breathing. No lower extremity edema or tenderness. CBC is within normal limits. No leukocytosis. CMP is remarkable for a glucose of 159. Normal liver enzymes. Normal lipase. Lactic acid was obtained by triage nurse which revealed a lactic acid of 2.18. Repeat is normal 1.60.  CT abdomen and pelvis shows postoperative cholecystectomy. No fluid or infiltration in the  gallbladder fossa. No free fluid in the abdomen. There is some focal infiltration in the anterior abdominal wall around the umbilicus. Likely postoperative. No loculated collection to suggest abscess. No evidence of bowel obstruction or inflammation. Patient is not tender around his umbilicus. He is tender more around his right mid abdomen. Chest x-ray was also obtained which showed no acute findings.  I have low suspicion for PE in this patient as the patient denies any chest pain, coughing, shortness of breath or difficulty breathing. His lungs are clear to auscultation bilaterally. His not hypoxic, tachypnea cord tachycardic. No lower extremity edema or tenderness. I suspect patient's pain is postoperative. He tells me he took pain medication for 2 days following his surgery but has had nothing since. He tells me he feels much better after sentinel in the emergency department. CT scan and chest x-ray are reassuring. Blood work is also reassuring. Will provide him with some Norco for his pain. He last filled a prescription for 5 days worth according to the [facility] controlled substance database. I discussed strict and specific return precautions with the patient and family. I encouraged him to follow-up closely with his surgeon this coming week Dr. Magnus Ivan. I advised the patient to follow-up with their primary care  provider this week. I advised the patient to return to the emergency department with new or worsening symptoms or new concerns. The patient and family verbalized understanding and agreement with plan.       Final Clinical Impressions(s) / ED Diagnoses   Final diagnoses:  Post-operative pain  Right sided abdominal pain    New Prescriptions New Prescriptions   HYDROCODONE-ACETAMINOPHEN (NORCO/VICODIN) 5-325 MG TABLET    Take 1-2 tablets by mouth every 6 (six) hours as needed for moderate pain or severe pain.     Everlene Farrier, PA-C 08/08/16 1610    Arby Barrette, MD 08/11/16 [phone]

## 2016-08-07 NOTE — ED Notes (Signed)
ED Provider at bedside. 

## 2016-08-07 NOTE — ED Notes (Signed)
Patient transported to CT 

## 2016-08-07 NOTE — ED Triage Notes (Signed)
Pt endorses RUQ pain radiating to the back x 1 week. Denies n/v/d. Pt had gall bladder removal and hernia repair on 07/28/16. Pt was sent by surgeon to have blood drawn Thursday but stated that it showed no sign of infection. Surgeon sent pt here.

## 2016-08-07 NOTE — ED Notes (Signed)
Dr Donnald GarrePfeiffer given a copy of lactic acid results 2.18

## 2016-08-08 ENCOUNTER — Emergency Department (HOSPITAL_COMMUNITY): Payer: 59

## 2016-08-08 MED ORDER — HYDROCODONE-ACETAMINOPHEN 5-325 MG PO TABS: 1.0000 | ORAL_TABLET | Freq: Four times a day (QID) | ORAL | 0 refills | Status: DC | PRN

## 2016-08-08 MED ORDER — HYDROCODONE-ACETAMINOPHEN 5-325 MG PO TABS
2.0000 | ORAL_TABLET | Freq: Once | ORAL | Status: AC
  Administered 2016-08-08: 2 via ORAL
  Filled 2016-08-08: qty 2

## 2016-08-08 NOTE — ED Notes (Signed)
Patient transported to X-ray 

## 2016-08-12 LAB — CULTURE, BLOOD (ROUTINE X 2)
CULTURE: NO GROWTH
CULTURE: NO GROWTH

## 2016-08-24 ENCOUNTER — Encounter: Payer: Self-pay | Admitting: Internal Medicine

## 2016-08-24 ENCOUNTER — Ambulatory Visit (INDEPENDENT_AMBULATORY_CARE_PROVIDER_SITE_OTHER): Payer: 59 | Admitting: Internal Medicine

## 2016-08-24 VITALS — BP 110/78 | HR 70 | Temp 97.7°F | Resp 16 | Ht 66.0 in | Wt 199.2 lb

## 2016-08-24 DIAGNOSIS — E118 Type 2 diabetes mellitus with unspecified complications: Secondary | ICD-10-CM | POA: Diagnosis not present

## 2016-08-24 DIAGNOSIS — E781 Pure hyperglyceridemia: Secondary | ICD-10-CM

## 2016-08-24 DIAGNOSIS — I10 Essential (primary) hypertension: Secondary | ICD-10-CM

## 2016-08-24 DIAGNOSIS — E559 Vitamin D deficiency, unspecified: Secondary | ICD-10-CM

## 2016-08-24 DIAGNOSIS — Z794 Long term (current) use of insulin: Secondary | ICD-10-CM | POA: Diagnosis not present

## 2016-08-24 LAB — POCT GLYCOSYLATED HEMOGLOBIN (HGB A1C): HEMOGLOBIN A1C: 7.5

## 2016-08-24 MED ORDER — SITAGLIPTIN PHOS-METFORMIN HCL 50-1000 MG PO TABS: 1.0000 | ORAL_TABLET | Freq: Two times a day (BID) | ORAL | 1 refills | Status: DC

## 2016-08-24 MED ORDER — CHOLECALCIFEROL 1.25 MG (50000 UT) PO CAPS: 50000.0000 [IU] | ORAL_CAPSULE | ORAL | 3 refills | Status: DC

## 2016-08-24 NOTE — Progress Notes (Signed)
Subjective:  Patient ID: [name], male    DOB: [birth year]  Age: 56 y.o. MRN: [id number]  CC: Follow-up (Labs and medication) and Diabetes   HPI [name] presents for Follow-up, he underwent cholecystectomy 3 weeks ago and has recovered nicely with no episodes of abdominal pain, nausea, or vomiting. He also tells me his blood sugars are improving. He has had no recent episodes of visual disturbance and denies polys.  Outpatient Medications Prior to Visit  Medication Sig Dispense Refill  . aspirin 81 MG tablet Take 81 mg by mouth daily.      . clopidogrel (PLAVIX) 75 MG tablet Take 1 tablet (75 mg total) by mouth daily. 90 tablet 3  . metoprolol tartrate (LOPRESSOR) 25 MG tablet Take 1 tablet (25 mg total) by mouth 2 (two) times daily. 180 tablet 3  . nitroGLYCERIN (NITROSTAT) 0.4 MG SL tablet Place 1 tablet (0.4 mg total) under the tongue every 5 (five) minutes as needed for chest pain. (Patient taking differently: Place 0.4 mg under the tongue every 5 (five) minutes as needed for chest pain. Chest pain) 25 tablet 2  . olmesartan (BENICAR) 20 MG tablet Take 1 tablet (20 mg total) by mouth daily. 90 tablet 3  . omega-3 acid ethyl esters (LOVAZA) 1 g capsule Take 2 capsules (2 g total) by mouth 2 (two) times daily. 120 capsule 11  . rosuvastatin (CRESTOR) 40 MG tablet Take 1 tablet (40 mg total) by mouth daily. (Patient taking differently: Take 40 mg by mouth every evening. ) 90 tablet 3  . amoxicillin-clavulanate (AUGMENTIN) 875-125 MG tablet Take 1 tablet by mouth 2 (two) times daily. 7 day course started 07-20-2016    . Cholecalciferol (D3-50) 50000 units capsule Take 1 capsule (50,000 Units total) by mouth daily. Overdue for yearly physical w/labs must see md for refills (Patient taking differently: Take 50,000 Units by mouth every 7 (seven) days. Wednesdays) 4 capsule 11  . HYDROcodone-acetaminophen (NORCO/VICODIN) 5-325 MG tablet Take 1-2 tablets by mouth every 6 (six) hours as  needed for moderate pain or severe pain. 15 tablet 0  . ondansetron (ZOFRAN) 4 MG tablet Take 1 tablet (4 mg total) by mouth every 8 (eight) hours as needed for nausea or vomiting. 20 tablet 1  . sitaGLIPtin-metformin (JANUMET) 50-1000 MG tablet Take 1 tablet by mouth 2 (two) times daily with a meal. 180 tablet 1   No facility-administered medications prior to visit.     ROS Review of Systems  Constitutional: Negative for activity change, chills, diaphoresis and fatigue.  HENT: Negative.   Eyes: Negative for visual disturbance.  Respiratory: Negative for cough, chest tightness, shortness of breath and wheezing.   Cardiovascular: Negative for chest pain, palpitations and leg swelling.  Gastrointestinal: Negative.  Negative for abdominal pain, constipation, diarrhea, nausea and vomiting.  Endocrine: Negative.  Negative for polydipsia, polyphagia and polyuria.  Genitourinary: Negative.  Negative for difficulty urinating and dysuria.  Musculoskeletal: Negative.  Negative for back pain, myalgias and neck pain.  Skin: Negative.  Negative for color change and rash.  Allergic/Immunologic: Negative.   Neurological: Negative.  Negative for dizziness, weakness and numbness.  Hematological: Negative.  Negative for adenopathy. Does not bruise/bleed easily.  Psychiatric/Behavioral: Negative.     Objective:  BP 110/78   Pulse 70   Temp 97.7 F (36.5 C) (Oral)   Resp 16   Ht 5\' 6"  (1.676 m)   Wt 199 lb 4 oz (90.4 kg)   SpO2 98%   BMI 32.16  kg/m   BP Readings from Last 3 Encounters:  08/24/16 110/78  08/07/16 118/73  07/28/16 (!) 129/92    Wt Readings from Last 3 Encounters:  08/24/16 199 lb 4 oz (90.4 kg)  08/07/16 205 lb (93 kg)  07/28/16 203 lb 6.4 oz (92.3 kg)    Physical Exam  Constitutional: He is oriented to person, place, and time. No distress.  HENT:  Mouth/Throat: No oropharyngeal exudate.  Eyes: Conjunctivae are normal. Right eye exhibits no discharge. Left eye exhibits  no discharge. No scleral icterus.  Neck: Normal range of motion. Neck supple. No JVD present. No tracheal deviation present. No thyromegaly present.  Cardiovascular: Normal rate, regular rhythm, normal heart sounds and intact distal pulses.  Exam reveals no gallop and no friction rub.   No murmur heard. Pulmonary/Chest: Effort normal and breath sounds normal. No stridor. No respiratory distress. He has no wheezes. He has no rales. He exhibits no tenderness.  Abdominal: Soft. Bowel sounds are normal. He exhibits no distension and no mass. There is no tenderness. There is no rebound and no guarding.  Musculoskeletal: Normal range of motion. He exhibits no edema, tenderness or deformity.  Lymphadenopathy:    He has no cervical adenopathy.  Neurological: He is oriented to person, place, and time.  Skin: Skin is warm and dry. No rash noted. He is not diaphoretic. No erythema. No pallor.  Vitals reviewed.   Lab Results  Component Value Date   WBC 9.5 08/07/2016   HGB 14.5 08/07/2016   HCT 43.5 08/07/2016   PLT 244 08/07/2016   GLUCOSE 159 (H) 08/07/2016   CHOL 165 06/29/2016   TRIG (H) 06/29/2016    419.0 Triglyceride is over 400; calculations on Lipids are invalid.   HDL 34.10 (L) 06/29/2016   LDLDIRECT 74.0 06/29/2016   LDLCALC 35 08/21/2013   ALT 35 08/07/2016   AST 28 08/07/2016   NA 140 08/07/2016   K 4.1 08/07/2016   CL 102 08/07/2016   CREATININE 0.94 08/07/2016   BUN 14 08/07/2016   CO2 25 08/07/2016   TSH 1.85 06/29/2016   PSA 0.47 09/04/2015   INR 0.9 ratio 04/22/2009   HGBA1C 7.5 08/24/2016   MICROALBUR 55.3 (H) 06/29/2016    Dg Chest 2 View  Result Date: 08/08/2016 CLINICAL DATA:  Initial evaluation for acute right upper quadrant pain status post surgery. EXAM: CHEST  2 VIEW COMPARISON:  Prior radiograph from 07/10/2010. FINDINGS: The cardiac and mediastinal silhouettes are stable in size and contour, and remain within normal limits. Suspected underlying emphysema.  No airspace consolidation, pleural effusion, or pulmonary edema is identified. There is no pneumothorax. No acute osseous abnormality identified. IMPRESSION: No active cardiopulmonary disease. Electronically Signed   By: Rise Mu M.D.   On: 08/08/2016 00:29   Ct Abdomen Pelvis W Contrast  Result Date: 08/07/2016 CLINICAL DATA:  Right upper quadrant abdominal pain radiating to the back for 4 days. History of laparoscopic cholecystectomy and umbilical hernia repair 1 week ago. EXAM: CT ABDOMEN AND PELVIS WITH CONTRAST TECHNIQUE: Multidetector CT imaging of the abdomen and pelvis was performed using the standard protocol following bolus administration of intravenous contrast. CONTRAST:  ISOVUE-300 IOPAMIDOL (ISOVUE-300) INJECTION 61% COMPARISON:  07/12/2016 FINDINGS: Lower chest: Minimal dependent changes in the lung bases. Coronary artery calcifications. Hepatobiliary: Surgical absence of the gallbladder. No bile duct dilatation. No fluid collections or infiltrative changes demonstrated in the gallbladder fossa. Diffuse fatty infiltration of the liver. No focal liver lesions. Pancreas: Unremarkable. No pancreatic  ductal dilatation or surrounding inflammatory changes. Spleen: Normal in size without focal abnormality. Adrenals/Urinary Tract: Adrenal glands are unremarkable. Kidneys are normal, without renal calculi, focal lesion, or hydronephrosis. Bladder is unremarkable. Stomach/Bowel: Stomach is within normal limits. Appendix appears normal. No evidence of bowel wall thickening, distention, or inflammatory changes. Vascular/Lymphatic: Aortic atherosclerosis. No enlarged abdominal or pelvic lymph nodes. Reproductive: Borderline prostate size at 3.8 cm diameter. Prostate calcifications are present. Other: There is focal infiltration in the anterior abdominal wall at the level of the umbilicus, likely representing postoperative change. No loculated fluid collections. No free fluid or free air in  the abdomen. Abdominal wall musculature appears intact. Musculoskeletal: Degenerative changes in the spine. No destructive bone lesions. IMPRESSION: Postoperative cholecystectomy. No fluid or infiltration in the gallbladder fossa. No free fluid in the abdomen. Focal infiltration in the anterior abdominal wall around the umbilical region is likely postoperative. No loculated collection to suggest abscess. Diffuse fatty infiltration of the liver. No evidence of bowel obstruction or inflammation. Electronically Signed   By: Burman NievesWilliam  Stevens M.D.   On: 08/07/2016 22:46    Assessment & Plan:   Iantha FallenKenneth was seen today for follow-up and diabetes.  Diagnoses and all orders for this visit:  Essential hypertension- his blood pressure is adequately well controlled.  Type 2 diabetes mellitus with complication, with long-term current use of insulin (HCC)- his A1c is down to 7.5%, improvement noted, he will continue the current medications and he was encouraged again to improve on his lifestyle modifications. -     Ambulatory referral to Ophthalmology -     sitaGLIPtin-metformin (JANUMET) 50-1000 MG tablet; Take 1 tablet by mouth 2 (two) times daily with a meal. -     POCT glycosylated hemoglobin (Hb A1C)  Pure hyperglyceridemia- will continue lovaza  Vitamin D deficiency -     Cholecalciferol (D3-50) 50000 units capsule; Take 1 capsule (50,000 Units total) by mouth every 7 (seven) days. Wednesdays  Type 2 diabetes mellitus with complication, without long-term current use of insulin (HCC)   I have discontinued Mr. Cremer's amoxicillin-clavulanate, ondansetron, and HYDROcodone-acetaminophen. I have also changed his Cholecalciferol. Additionally, I am having him maintain his aspirin, olmesartan, clopidogrel, metoprolol tartrate, rosuvastatin, nitroGLYCERIN, omega-3 acid ethyl esters, and sitaGLIPtin-metformin.  Meds ordered this encounter  Medications  . Cholecalciferol (D3-50) 50000 units capsule     Sig: Take 1 capsule (50,000 Units total) by mouth every 7 (seven) days. Wednesdays    Dispense:  12 capsule    Refill:  3  . sitaGLIPtin-metformin (JANUMET) 50-1000 MG tablet    Sig: Take 1 tablet by mouth 2 (two) times daily with a meal.    Dispense:  180 tablet    Refill:  1     Follow-up: Return in about 6 months (around 02/24/2017).  Sanda Lingerhomas Budd Freiermuth, MD

## 2016-08-24 NOTE — Patient Instructions (Signed)

## 2016-08-24 NOTE — Progress Notes (Signed)
Pre-visit discussion using our clinic review tool. No additional management support is needed unless otherwise documented below in the visit note.  

## 2016-09-22 NOTE — Telephone Encounter (Signed)
Received fax from [employer] stating that the order will be cancelled.   Called home number and spoke to spouse. She stated that the contacted [employer] and will try to have a sample to send. Since gallbladder surgery pt bowel movements have been very loose and unpredictable.

## 2016-10-14 ENCOUNTER — Other Ambulatory Visit: Payer: Self-pay | Admitting: Internal Medicine

## 2016-12-02 NOTE — Progress Notes (Signed)
Cardiology Office Note Date:  12/03/2016   ID:  [name], DOB 03-05-1961, MRN [id number]  PCP:  Etta GrandchildJones, Thomas L, MD  Cardiologist:  Tonny Bollmanooper, Cloa Bushong, MD    Chief Complaint  Patient presents with  . essential hypertension    History of Present Illness: [name] is a 56 y.o. male who presents for  follow-up of coronary artery disease. The patient has had multiple PCI procedures. Other comorbid medical conditions include hypertension, hyperlipidemia, and Type 2 diabetes. His most recent heart catheterization in 2008 demonstrated normal LV systolic function, continued patency of the stented segments in the RCA and distal left circumflex, and nonobstructive disease elsewhere. He has been managed medically since that time.  The patient is here with his family today. He is doing well. He's been maintained on long-term dual antiplatelet therapy without bleeding problems. He required gallbladder surgery in February of this year and this was uncomplicated. He remains physically active with regular exercise. Also states he walks up and down 3 flights of stairs many times per day. He has no exertional symptoms. Today, he denies symptoms of palpitations, chest pain, shortness of breath, orthopnea, PND, lower extremity edema, dizziness, or syncope.   Past Medical History:  Diagnosis Date  . Asthma   . Coronary artery disease    s/p BMS to RCA and CFX. s/p DES to RCA 2/2 ISR 2010  . Diabetes mellitus without complication (HCC)   . History of kidney stones    20 yrs ago   . Hyperlipidemia   . Hypertension   . Old myocardial infarction   . Paroxysmal ventricular tachycardia (HCC)   . Sleep apnea    not using cpap    5-6 yrs     Past Surgical History:  Procedure Laterality Date  . CARDIAC CATHETERIZATION    . CHOLECYSTECTOMY N/A 07/28/2016   Procedure: LAPAROSCOPIC CHOLECYSTECTOMY;  Surgeon: Abigail Miyamotoouglas Blackman, MD;  [facility];  Service: General;  Laterality: N/A;  . CORONARY  STENT PLACEMENT  2002, 2007, 2010   right  . UMBILICAL HERNIA REPAIR N/A 07/28/2016   Procedure: HERNIA REPAIR UMBILICAL ADULT;  Surgeon: Abigail Miyamotoouglas Blackman, MD;  [facility];  Service: General;  Laterality: N/A;    Current Outpatient Prescriptions  Medication Sig Dispense Refill  . aspirin 81 MG tablet Take 81 mg by mouth daily.      . Cholecalciferol (D3-50) 50000 units capsule Take 1 capsule (50,000 Units total) by mouth every 7 (seven) days. Wednesdays 12 capsule 3  . clopidogrel (PLAVIX) 75 MG tablet Take 1 tablet (75 mg total) by mouth daily. 90 tablet 3  . metoprolol tartrate (LOPRESSOR) 25 MG tablet Take 1 tablet (25 mg total) by mouth 2 (two) times daily. 180 tablet 3  . nitroGLYCERIN (NITROSTAT) 0.4 MG SL tablet Place 1 tablet (0.4 mg total) under the tongue every 5 (five) minutes as needed for chest pain. 25 tablet 3  . olmesartan (BENICAR) 20 MG tablet Take 1 tablet (20 mg total) by mouth daily. 90 tablet 3  . omega-3 acid ethyl esters (LOVAZA) 1 g capsule Take 2 capsules (2 g total) by mouth 2 (two) times daily. 120 capsule 11  . rosuvastatin (CRESTOR) 40 MG tablet Take 1 tablet (40 mg total) by mouth every evening. 30 tablet 11  . sitaGLIPtin-metformin (JANUMET) 50-1000 MG tablet Take 1 tablet by mouth 2 (two) times daily with a meal. 180 tablet 1   No current facility-administered medications for this visit.     Allergies:  Heparin   Social History:  The patient  reports that he quit smoking about 5 years ago. His smoking use included Cigarettes. He has a 45.00 pack-year smoking history. He has never used smokeless tobacco. He reports that he does not drink alcohol or use drugs.   Family History:  The patient's  family history includes Alzheimer's disease in his mother; Diabetes in his mother and sister; Heart disease in his brother, mother, and sister; Kidney disease in his sister; Peripheral vascular disease in his sister.    ROS:  Please see the history of present  illness.  Otherwise, review of systems is positive for nausea, abdominal pain.  All other systems are reviewed and negative.    PHYSICAL EXAM: VS:  BP 108/74   Pulse 73   Ht 5\' 6"  (1.676 m)   Wt 197 lb 6.4 oz (89.5 kg)   BMI 31.86 kg/m  , BMI Body mass index is 31.86 kg/m. GEN: Well nourished, well developed, in no acute distress  HEENT: normal  Neck: no JVD, no masses. No carotid bruits Cardiac: RRR without murmur or gallop                Respiratory:  clear to auscultation bilaterally, normal work of breathing GI: soft, nontender, nondistended, + BS MS: no deformity or atrophy  Ext: no pretibial edema, pedal pulses 2+= bilaterally Skin: warm and dry, no rash Neuro:  Strength and sensation are intact Psych: euthymic mood, full affect  EKG:  EKG is ordered today. The ekg ordered today shows normal sinus rhythm with occasional PVC, nonspecific ST abnormality.  Recent Labs: 06/29/2016: TSH 1.85 08/07/2016: ALT 35; BUN 14; Creatinine, Ser 0.94; Hemoglobin 14.5; Platelets 244; Potassium 4.1; Sodium 140   Lipid Panel     Component Value Date/Time   CHOL 165 06/29/2016 0846   CHOL 219 08/20/2012   TRIG (H) 06/29/2016 0846    419.0 Triglyceride is over 400; calculations on Lipids are invalid.   TRIG 402 08/20/2012   HDL 34.10 (L) 06/29/2016 0846   CHOLHDL 5 06/29/2016 0846   VLDL 46.6 (H) 09/04/2015 1117   LDLCALC 35 08/21/2013 0850   LDLCALC 107 08/20/2012   LDLDIRECT 74.0 06/29/2016 0846      Wt Readings from Last 3 Encounters:  12/03/16 197 lb 6.4 oz (89.5 kg)  08/24/16 199 lb 4 oz (90.4 kg)  08/07/16 205 lb (93 kg)     ASSESSMENT AND PLAN: 1.  CAD, native vessel, without symptoms of angina: The patient is doing well with respect to his CAD. He will continue on aspirin and Plavix. He has lost 12 pounds since his last visit here in 2017 and I encouraged him to continue to work hard on diet and exercise.  2. HTN: Blood pressure is well controlled on metoprolol and  Benicar  3. Hyperlipidemia: Most recent lipids reviewed at goal on Crestor 40 mg daily.  4. Type 2 diabetes: Treated with oral hypoglycemics. Last hemoglobin A1c 7.5 mg/dL. Managed by Dr. Yetta Barre.  Current medicines are reviewed with the patient today.  The patient does not have concerns regarding medicines.  Labs/ tests ordered today include:  No orders of the defined types were placed in this encounter.  Disposition:   FU one year unless problems arise  Signed, Tonny Bollman, MD  12/03/2016 8:32 AM    [facility] [address] [place], [place], [place]  [address] Phone: [phone]; Fax: [phone]

## 2016-12-03 ENCOUNTER — Encounter (INDEPENDENT_AMBULATORY_CARE_PROVIDER_SITE_OTHER): Payer: Self-pay

## 2016-12-03 ENCOUNTER — Ambulatory Visit (INDEPENDENT_AMBULATORY_CARE_PROVIDER_SITE_OTHER): Payer: 59 | Admitting: Cardiovascular Disease

## 2016-12-03 ENCOUNTER — Encounter: Payer: Self-pay | Admitting: Cardiovascular Disease

## 2016-12-03 DIAGNOSIS — I2510 Atherosclerotic heart disease of native coronary artery without angina pectoris: Secondary | ICD-10-CM

## 2016-12-03 DIAGNOSIS — E781 Pure hyperglyceridemia: Secondary | ICD-10-CM

## 2016-12-03 DIAGNOSIS — E118 Type 2 diabetes mellitus with unspecified complications: Secondary | ICD-10-CM | POA: Diagnosis not present

## 2016-12-03 DIAGNOSIS — I10 Essential (primary) hypertension: Secondary | ICD-10-CM | POA: Diagnosis not present

## 2016-12-03 MED ORDER — OMEGA-3-ACID ETHYL ESTERS 1 G PO CAPS: 2.0000 | ORAL_CAPSULE | Freq: Two times a day (BID) | ORAL | 11 refills | Status: DC

## 2016-12-03 MED ORDER — OLMESARTAN MEDOXOMIL 20 MG PO TABS: 20.0000 mg | ORAL_TABLET | Freq: Every day | ORAL | 3 refills | Status: DC

## 2016-12-03 MED ORDER — ROSUVASTATIN CALCIUM 40 MG PO TABS: 40.0000 mg | ORAL_TABLET | Freq: Every evening | ORAL | 11 refills | Status: DC

## 2016-12-03 MED ORDER — CLOPIDOGREL BISULFATE 75 MG PO TABS: 75.0000 mg | ORAL_TABLET | Freq: Every day | ORAL | 3 refills | Status: DC

## 2016-12-03 MED ORDER — METOPROLOL TARTRATE 25 MG PO TABS: 25.0000 mg | ORAL_TABLET | Freq: Two times a day (BID) | ORAL | 3 refills | Status: DC

## 2016-12-03 MED ORDER — NITROGLYCERIN 0.4 MG SL SUBL: 0.4000 mg | SUBLINGUAL_TABLET | SUBLINGUAL | 3 refills | Status: DC | PRN

## 2016-12-03 NOTE — Patient Instructions (Signed)

## 2016-12-06 ENCOUNTER — Other Ambulatory Visit: Payer: Self-pay

## 2016-12-06 DIAGNOSIS — I10 Essential (primary) hypertension: Secondary | ICD-10-CM

## 2016-12-09 ENCOUNTER — Other Ambulatory Visit: Payer: Self-pay

## 2016-12-09 DIAGNOSIS — I10 Essential (primary) hypertension: Secondary | ICD-10-CM

## 2017-02-03 ENCOUNTER — Other Ambulatory Visit: Payer: Self-pay | Admitting: Cardiovascular Disease

## 2017-02-03 DIAGNOSIS — E781 Pure hyperglyceridemia: Secondary | ICD-10-CM

## 2017-02-03 DIAGNOSIS — I2510 Atherosclerotic heart disease of native coronary artery without angina pectoris: Secondary | ICD-10-CM

## 2017-02-03 DIAGNOSIS — I10 Essential (primary) hypertension: Secondary | ICD-10-CM

## 2017-02-03 NOTE — Telephone Encounter (Signed)
metoprolol tartrate (LOPRESSOR) 25 MG tablet  Medication  Date: 12/03/2016 [facility] [address] [facility] Ordering/Authorizing: Tonny Bollmanooper, Michael, MD  Order Providers   Prescribing Provider Encounter Provider  Tonny Bollmanooper, Michael, MD Tonny Bollmanooper, Michael, MD  Medication Detail    Disp Refills Start End   metoprolol tartrate (LOPRESSOR) 25 MG tablet 180 tablet 3 12/03/2016    Sig - Route: Take 1 tablet (25 mg total) by mouth 2 (two) times daily. - Oral   Sent to pharmacy as: metoprolol tartrate (LOPRESSOR) 25 MG tablet   E-Prescribing Status: Receipt confirmed by pharmacy (12/03/2016 8:32 AM EDT)   Associated Diagnoses   Essential hypertension     Pure hyperglyceridemia     Pharmacy   [id number] [place]- [place], [place] - [address]   rosuvastatin (CRESTOR) 40 MG tablet  Medication  Date: 12/03/2016 [facility] [address] [facility] Ordering/Authorizing: Tonny Bollmanooper, Michael, MD  Order Providers   Prescribing Provider Encounter Provider  Tonny Bollmanooper, Michael, MD Tonny Bollmanooper, Michael, MD  Medication Detail    Disp Refills Start End   rosuvastatin (CRESTOR) 40 MG tablet 30 tablet 11 12/03/2016    Sig - Route: Take 1 tablet (40 mg total) by mouth every evening. - Oral   Sent to pharmacy as: rosuvastatin (CRESTOR) 40 MG tablet   E-Prescribing Status: Receipt confirmed by pharmacy (12/03/2016 8:32 AM EDT)   Pharmacy   [id number] [place] - [address]   clopidogrel (PLAVIX) 75 MG tablet  Medication  Date: 12/03/2016 [facility] [address] [facility] Ordering/Authorizing: Tonny Bollmanooper, Michael, MD  Order Providers   Prescribing Provider Encounter Provider  Tonny Bollmanooper, Michael, MD Tonny Bollmanooper, Michael, MD  Medication Detail    Disp Refills Start End   clopidogrel (PLAVIX) 75 MG tablet 90 tablet 3 12/03/2016    Sig - Route: Take 1 tablet (75 mg total) by mouth daily. - Oral   Sent to pharmacy as:  clopidogrel (PLAVIX) 75 MG tablet   E-Prescribing Status: Receipt confirmed by pharmacy (12/03/2016 8:32 AM EDT)   Associated Diagnoses   Coronary artery disease involving native coronary artery of native heart without angina pectoris     Pharmacy   [id number]- [place], [place] - [address]    RX already on file.

## 2017-02-28 ENCOUNTER — Ambulatory Visit (INDEPENDENT_AMBULATORY_CARE_PROVIDER_SITE_OTHER): Payer: 59 | Admitting: Internal Medicine

## 2017-02-28 ENCOUNTER — Other Ambulatory Visit (INDEPENDENT_AMBULATORY_CARE_PROVIDER_SITE_OTHER): Payer: 59

## 2017-02-28 ENCOUNTER — Encounter: Payer: Self-pay | Admitting: Internal Medicine

## 2017-02-28 VITALS — BP 136/84 | HR 65 | Temp 97.9°F | Resp 16 | Ht 66.0 in | Wt 200.0 lb

## 2017-02-28 DIAGNOSIS — Z23 Encounter for immunization: Secondary | ICD-10-CM

## 2017-02-28 DIAGNOSIS — E781 Pure hyperglyceridemia: Secondary | ICD-10-CM

## 2017-02-28 DIAGNOSIS — R3129 Other microscopic hematuria: Secondary | ICD-10-CM

## 2017-02-28 DIAGNOSIS — E118 Type 2 diabetes mellitus with unspecified complications: Secondary | ICD-10-CM | POA: Diagnosis not present

## 2017-02-28 DIAGNOSIS — I10 Essential (primary) hypertension: Secondary | ICD-10-CM | POA: Diagnosis not present

## 2017-02-28 DIAGNOSIS — Z794 Long term (current) use of insulin: Secondary | ICD-10-CM

## 2017-02-28 LAB — POCT GLYCOSYLATED HEMOGLOBIN (HGB A1C): HEMOGLOBIN A1C: 7

## 2017-02-28 LAB — URINALYSIS, ROUTINE W REFLEX MICROSCOPIC
BILIRUBIN URINE: NEGATIVE
Hgb urine dipstick: NEGATIVE
KETONES UR: NEGATIVE
LEUKOCYTES UA: NEGATIVE
Nitrite: NEGATIVE
PH: 6 (ref 5.0–8.0)
RBC / HPF: NONE SEEN (ref 0–?)
SPECIFIC GRAVITY, URINE: 1.015 (ref 1.000–1.030)
TOTAL PROTEIN, URINE-UPE24: NEGATIVE
UROBILINOGEN UA: 0.2 (ref 0.0–1.0)
Urine Glucose: 250 — AB

## 2017-02-28 LAB — BASIC METABOLIC PANEL
BUN: 16 mg/dL (ref 6–23)
CO2: 26 mEq/L (ref 19–32)
Calcium: 9.8 mg/dL (ref 8.4–10.5)
Chloride: 102 mEq/L (ref 96–112)
Creatinine, Ser: 0.76 mg/dL (ref 0.40–1.50)
GFR: 113.69 mL/min (ref >60.00)
Glucose, Bld: 143 mg/dL — ABNORMAL HIGH (ref 70–99)
POTASSIUM: 4 meq/L (ref 3.5–5.1)
SODIUM: 141 meq/L (ref 135–145)

## 2017-02-28 LAB — TRIGLYCERIDES: TRIGLYCERIDES: 298 mg/dL — AB (ref 0.0–149.0)

## 2017-02-28 MED ORDER — SITAGLIPTIN PHOS-METFORMIN HCL 50-1000 MG PO TABS: 1.0000 | ORAL_TABLET | Freq: Two times a day (BID) | ORAL | 1 refills | Status: DC

## 2017-02-28 MED ORDER — GLUCOSE BLOOD VI STRP: ORAL_STRIP | 11 refills | Status: DC

## 2017-02-28 MED ORDER — ACCU-CHEK GUIDE W/DEVICE KIT: 1.0000 | PACK | Freq: Two times a day (BID) | 0 refills | Status: DC

## 2017-02-28 MED ORDER — OLMESARTAN MEDOXOMIL 20 MG PO TABS: 20.0000 mg | ORAL_TABLET | Freq: Every day | ORAL | 3 refills | Status: DC

## 2017-02-28 NOTE — Patient Instructions (Signed)

## 2017-02-28 NOTE — Progress Notes (Signed)
Subjective:  Patient ID: [name], male    DOB: 1961-01-16  Age: 56 y.o. MRN: [id number]  CC: Hyperlipidemia and Diabetes   HPI [name] presents for f/up - he feels well and offers no complaints.  Outpatient Medications Prior to Visit  Medication Sig Dispense Refill  . aspirin 81 MG tablet Take 81 mg by mouth daily.      . Cholecalciferol (D3-50) 50000 units capsule Take 1 capsule (50,000 Units total) by mouth every 7 (seven) days. Wednesdays 12 capsule 3  . clopidogrel (PLAVIX) 75 MG tablet Take 1 tablet (75 mg total) by mouth daily. 90 tablet 3  . metoprolol tartrate (LOPRESSOR) 25 MG tablet Take 1 tablet (25 mg total) by mouth 2 (two) times daily. 180 tablet 3  . nitroGLYCERIN (NITROSTAT) 0.4 MG SL tablet Place 1 tablet (0.4 mg total) under the tongue every 5 (five) minutes as needed for chest pain. 25 tablet 3  . omega-3 acid ethyl esters (LOVAZA) 1 g capsule Take 2 capsules (2 g total) by mouth 2 (two) times daily. 120 capsule 11  . rosuvastatin (CRESTOR) 40 MG tablet Take 1 tablet (40 mg total) by mouth every evening. 30 tablet 11  . olmesartan (BENICAR) 20 MG tablet Take 1 tablet (20 mg total) by mouth daily. 90 tablet 3  . sitaGLIPtin-metformin (JANUMET) 50-1000 MG tablet Take 1 tablet by mouth 2 (two) times daily with a meal. 180 tablet 1   No facility-administered medications prior to visit.     ROS Review of Systems  Constitutional: Negative.  Negative for appetite change, diaphoresis, fatigue and unexpected weight change.  HENT: Negative.  Negative for sinus pressure and trouble swallowing.   Eyes: Negative for visual disturbance.  Respiratory: Negative.  Negative for cough, chest tightness, shortness of breath and wheezing.   Cardiovascular: Negative.  Negative for chest pain, palpitations and leg swelling.  Gastrointestinal: Negative for abdominal pain, constipation, diarrhea, nausea and vomiting.  Endocrine: Positive for polydipsia. Negative for  polyphagia and polyuria.  Genitourinary: Negative.   Musculoskeletal: Negative.  Negative for back pain and myalgias.  Skin: Negative.  Negative for rash.  Allergic/Immunologic: Negative.   Neurological: Negative.  Negative for dizziness and light-headedness.  Hematological: Negative for adenopathy. Does not bruise/bleed easily.  Psychiatric/Behavioral: Negative.     Objective:  BP 136/84   Pulse 65   Temp 97.9 F (36.6 C) (Oral)   Resp 16   Ht 5' 6"  (1.676 m)   Wt 200 lb (90.7 kg)   SpO2 98%   BMI 32.28 kg/m   BP Readings from Last 3 Encounters:  02/28/17 136/84  12/03/16 108/74  08/24/16 110/78    Wt Readings from Last 3 Encounters:  02/28/17 200 lb (90.7 kg)  12/03/16 197 lb 6.4 oz (89.5 kg)  08/24/16 199 lb 4 oz (90.4 kg)    Physical Exam  Constitutional: He is oriented to person, place, and time. No distress.  HENT:  Mouth/Throat: Oropharynx is clear and moist. No oropharyngeal exudate.  Eyes: Conjunctivae are normal. Right eye exhibits no discharge. Left eye exhibits no discharge. No scleral icterus.  Neck: Normal range of motion. Neck supple. No JVD present. No thyromegaly present.  Cardiovascular: Normal rate, regular rhythm and intact distal pulses.  Exam reveals no gallop and no friction rub.   No murmur heard. Pulmonary/Chest: Effort normal and breath sounds normal. No respiratory distress. He has no wheezes. He has no rales. He exhibits no tenderness.  Abdominal: Soft. Bowel sounds are normal. He  exhibits no distension and no mass. There is no tenderness. There is no rebound and no guarding.  Musculoskeletal: Normal range of motion. He exhibits no edema, tenderness or deformity.  Lymphadenopathy:    He has no cervical adenopathy.  Neurological: He is alert and oriented to person, place, and time.  Skin: Skin is warm and dry. No rash noted. He is not diaphoretic. No erythema. No pallor.  Vitals reviewed.   Lab Results  Component Value Date   WBC 9.5  08/07/2016   HGB 14.5 08/07/2016   HCT 43.5 08/07/2016   PLT 244 08/07/2016   GLUCOSE 143 (H) 02/28/2017   CHOL 165 06/29/2016   TRIG 298.0 (H) 02/28/2017   HDL 34.10 (L) 06/29/2016   LDLDIRECT 74.0 06/29/2016   LDLCALC 35 08/21/2013   ALT 35 08/07/2016   AST 28 08/07/2016   NA 141 02/28/2017   K 4.0 02/28/2017   CL 102 02/28/2017   CREATININE 0.76 02/28/2017   BUN 16 02/28/2017   CO2 26 02/28/2017   TSH 1.85 06/29/2016   PSA 0.47 09/04/2015   INR 0.9 ratio 04/22/2009   HGBA1C 7.0 02/28/2017   MICROALBUR 55.3 (H) 06/29/2016    Dg Chest 2 View  Result Date: 08/08/2016 CLINICAL DATA:  Initial evaluation for acute right upper quadrant pain status post surgery. EXAM: CHEST  2 VIEW COMPARISON:  Prior radiograph from 07/10/2010. FINDINGS: The cardiac and mediastinal silhouettes are stable in size and contour, and remain within normal limits. Suspected underlying emphysema. No airspace consolidation, pleural effusion, or pulmonary edema is identified. There is no pneumothorax. No acute osseous abnormality identified. IMPRESSION: No active cardiopulmonary disease. Electronically Signed   By: Jeannine Boga M.D.   On: 08/08/2016 00:29   Ct Abdomen Pelvis W Contrast  Result Date: 08/07/2016 CLINICAL DATA:  Right upper quadrant abdominal pain radiating to the back for 4 days. History of laparoscopic cholecystectomy and umbilical hernia repair 1 week ago. EXAM: CT ABDOMEN AND PELVIS WITH CONTRAST TECHNIQUE: Multidetector CT imaging of the abdomen and pelvis was performed using the standard protocol following bolus administration of intravenous contrast. CONTRAST:  163m ISOVUE-300 IOPAMIDOL (ISOVUE-300) INJECTION 61% COMPARISON:  07/12/2016 FINDINGS: Lower chest: Minimal dependent changes in the lung bases. Coronary artery calcifications. Hepatobiliary: Surgical absence of the gallbladder. No bile duct dilatation. No fluid collections or infiltrative changes demonstrated in the gallbladder  fossa. Diffuse fatty infiltration of the liver. No focal liver lesions. Pancreas: Unremarkable. No pancreatic ductal dilatation or surrounding inflammatory changes. Spleen: Normal in size without focal abnormality. Adrenals/Urinary Tract: Adrenal glands are unremarkable. Kidneys are normal, without renal calculi, focal lesion, or hydronephrosis. Bladder is unremarkable. Stomach/Bowel: Stomach is within normal limits. Appendix appears normal. No evidence of bowel wall thickening, distention, or inflammatory changes. Vascular/Lymphatic: Aortic atherosclerosis. No enlarged abdominal or pelvic lymph nodes. Reproductive: Borderline prostate size at 3.8 cm diameter. Prostate calcifications are present. Other: There is focal infiltration in the anterior abdominal wall at the level of the umbilicus, likely representing postoperative change. No loculated fluid collections. No free fluid or free air in the abdomen. Abdominal wall musculature appears intact. Musculoskeletal: Degenerative changes in the spine. No destructive bone lesions. IMPRESSION: Postoperative cholecystectomy. No fluid or infiltration in the gallbladder fossa. No free fluid in the abdomen. Focal infiltration in the anterior abdominal wall around the umbilical region is likely postoperative. No loculated collection to suggest abscess. Diffuse fatty infiltration of the liver. No evidence of bowel obstruction or inflammation. Electronically Signed   By: WLucienne Capers  M.D.   On: 08/07/2016 22:46    Assessment & Plan:   [name] was seen today for hyperlipidemia and diabetes.  Diagnoses and all orders for this visit:  Type 2 diabetes mellitus with complication, without long-term current use of insulin ([place])- His A1c is at 7%. His blood sugars are adequately well controlled. -     Basic metabolic panel; Future -     Cancel: Hemoglobin A1c; Future -     sitaGLIPtin-metformin (JANUMET) 50-1000 MG tablet; Take 1 tablet by mouth 2 (two) times daily with  a meal. -     olmesartan (BENICAR) 20 MG tablet; Take 1 tablet (20 mg total) by mouth daily. -     Flu Vaccine QUAD 6+ mos PF IM (Fluarix Quad PF) -     Blood Glucose Monitoring Suppl (ACCU-CHEK GUIDE) w/Device KIT; 1 Act by Does not apply route 2 (two) times daily. -     glucose blood (ACCU-CHEK GUIDE) test strip; Use BID  Other microscopic hematuria- there is no blood in his urine today. This has resolved. -     Urinalysis, Routine w reflex microscopic; Future  Pure hyperglyceridemia- improvement noted with lifestyle modifications and lovaza. -     Triglycerides; Future -     POCT glycosylated hemoglobin (Hb A1C)  Type 2 diabetes mellitus with complication, with long-term current use of insulin ([facility]) -     POCT glycosylated hemoglobin (Hb A1C)  Essential hypertension- his blood pressure is well controlled. Electrolytes and renal function are normal.   I am having Mr. Kalish start on [place] and glucose blood. I am also having him maintain his aspirin, Cholecalciferol, clopidogrel, metoprolol tartrate, omega-3 acid ethyl esters, nitroGLYCERIN, rosuvastatin, sitaGLIPtin-metformin, and olmesartan.  Meds ordered this encounter  Medications  . sitaGLIPtin-metformin (JANUMET) 50-1000 MG tablet    Sig: Take 1 tablet by mouth 2 (two) times daily with a meal.    Dispense:  180 tablet    Refill:  1  . olmesartan (BENICAR) 20 MG tablet    Sig: Take 1 tablet (20 mg total) by mouth daily.    Dispense:  90 tablet    Refill:  3  . Blood Glucose Monitoring Suppl (ACCU-CHEK GUIDE) w/Device KIT    Sig: 1 Act by Does not apply route 2 (two) times daily.    Dispense:  2 kit    Refill:  0  . glucose blood (ACCU-CHEK GUIDE) test strip    Sig: Use BID    Dispense:  100 each    Refill:  11     Follow-up: Return in about 4 months (around 06/30/2017).  Scarlette Calico, MD

## 2017-06-30 ENCOUNTER — Encounter: Payer: Self-pay | Admitting: Internal Medicine

## 2017-06-30 ENCOUNTER — Other Ambulatory Visit (INDEPENDENT_AMBULATORY_CARE_PROVIDER_SITE_OTHER): Payer: 59

## 2017-06-30 ENCOUNTER — Ambulatory Visit: Payer: 59 | Admitting: Internal Medicine

## 2017-06-30 VITALS — BP 120/78 | HR 79 | Temp 97.9°F | Ht 66.0 in | Wt 201.0 lb

## 2017-06-30 DIAGNOSIS — I10 Essential (primary) hypertension: Secondary | ICD-10-CM | POA: Diagnosis not present

## 2017-06-30 DIAGNOSIS — E785 Hyperlipidemia, unspecified: Secondary | ICD-10-CM | POA: Diagnosis not present

## 2017-06-30 DIAGNOSIS — R3129 Other microscopic hematuria: Secondary | ICD-10-CM

## 2017-06-30 DIAGNOSIS — E118 Type 2 diabetes mellitus with unspecified complications: Secondary | ICD-10-CM | POA: Diagnosis not present

## 2017-06-30 DIAGNOSIS — E781 Pure hyperglyceridemia: Secondary | ICD-10-CM

## 2017-06-30 DIAGNOSIS — F4321 Adjustment disorder with depressed mood: Secondary | ICD-10-CM

## 2017-06-30 DIAGNOSIS — I2510 Atherosclerotic heart disease of native coronary artery without angina pectoris: Secondary | ICD-10-CM

## 2017-06-30 LAB — CBC WITH DIFFERENTIAL/PLATELET
BASOS ABS: 0 10*3/uL (ref 0.0–0.1)
Basophils Relative: 0.5 % (ref 0.0–3.0)
EOS ABS: 0.2 10*3/uL (ref 0.0–0.7)
Eosinophils Relative: 2.5 % (ref 0.0–5.0)
HCT: 42.8 % (ref 39.0–52.0)
Hemoglobin: 14.1 g/dL (ref 13.0–17.0)
LYMPHS ABS: 2.6 10*3/uL (ref 0.7–4.0)
LYMPHS PCT: 33.5 % (ref 12.0–46.0)
MCHC: 33.1 g/dL (ref 30.0–36.0)
MCV: 81.3 fl (ref 78.0–100.0)
Monocytes Absolute: 0.5 10*3/uL (ref 0.1–1.0)
Monocytes Relative: 6.9 % (ref 3.0–12.0)
NEUTROS ABS: 4.5 10*3/uL (ref 1.4–7.7)
Neutrophils Relative %: 56.6 % (ref 43.0–77.0)
Platelets: 231 10*3/uL (ref 150.0–400.0)
RBC: 5.26 Mil/uL (ref 4.22–5.81)
RDW: 14.9 % (ref 11.5–15.5)
WBC: 7.9 10*3/uL (ref 4.0–10.5)

## 2017-06-30 LAB — COMPREHENSIVE METABOLIC PANEL
ALK PHOS: 46 U/L (ref 39–117)
ALT: 26 U/L (ref 0–53)
AST: 22 U/L (ref 0–37)
Albumin: 4.7 g/dL (ref 3.5–5.2)
BILIRUBIN TOTAL: 0.5 mg/dL (ref 0.2–1.2)
BUN: 13 mg/dL (ref 6–23)
CO2: 28 meq/L (ref 19–32)
CREATININE: 0.79 mg/dL (ref 0.40–1.50)
Calcium: 9.7 mg/dL (ref 8.4–10.5)
Chloride: 101 mEq/L (ref 96–112)
GFR: 108.58 mL/min (ref >60.00)
Glucose, Bld: 194 mg/dL — ABNORMAL HIGH (ref 70–99)
Potassium: 3.9 mEq/L (ref 3.5–5.1)
Sodium: 139 mEq/L (ref 135–145)
TOTAL PROTEIN: 7.7 g/dL (ref 6.0–8.3)

## 2017-06-30 LAB — MICROALBUMIN / CREATININE URINE RATIO
CREATININE, U: 103.7 mg/dL
Microalb Creat Ratio: 14.2 mg/g (ref 0.0–30.0)
Microalb, Ur: 14.7 mg/dL — ABNORMAL HIGH (ref 0.0–1.9)

## 2017-06-30 LAB — LIPID PANEL
CHOL/HDL RATIO: 4
Cholesterol: 115 mg/dL (ref 0–200)
HDL: 29 mg/dL — ABNORMAL LOW (ref >39.00)
NONHDL: 85.7
TRIGLYCERIDES: 323 mg/dL — AB (ref 0.0–149.0)
VLDL: 64.6 mg/dL — AB (ref 0.0–40.0)

## 2017-06-30 LAB — URINALYSIS, ROUTINE W REFLEX MICROSCOPIC
BILIRUBIN URINE: NEGATIVE
Hgb urine dipstick: NEGATIVE
KETONES UR: NEGATIVE
Leukocytes, UA: NEGATIVE
Nitrite: NEGATIVE
PH: 5.5 (ref 5.0–8.0)
RBC / HPF: NONE SEEN (ref 0–?)
SPECIFIC GRAVITY, URINE: 1.025 (ref 1.000–1.030)
UROBILINOGEN UA: 0.2 (ref 0.0–1.0)
Urine Glucose: >=1000 — AB
WBC, UA: NONE SEEN (ref 0–?)

## 2017-06-30 LAB — POCT GLYCOSYLATED HEMOGLOBIN (HGB A1C): HEMOGLOBIN A1C: 7.8

## 2017-06-30 LAB — TSH: TSH: 1.75 u[IU]/mL (ref 0.35–4.50)

## 2017-06-30 LAB — LDL CHOLESTEROL, DIRECT: Direct LDL: 57 mg/dL

## 2017-06-30 MED ORDER — OMEGA-3-ACID ETHYL ESTERS 1 G PO CAPS: 2.0000 | ORAL_CAPSULE | Freq: Two times a day (BID) | ORAL | 1 refills | Status: DC

## 2017-06-30 MED ORDER — CANAGLIFLOZIN 100 MG PO TABS: 100.0000 mg | ORAL_TABLET | Freq: Every day | ORAL | 1 refills | Status: DC

## 2017-06-30 NOTE — Progress Notes (Signed)
Subjective:  Patient ID: [name], male    DOB: January 16, 1961  Age: 57 y.o. MRN: [id number]  CC: Hypertension; Hyperlipidemia; and Diabetes   HPI [name] presents for f/up - he is mourning the loss of his son who died from an overdose 3 months ago.  He is having sadness and crying spells but does not want to be treated for depression.  He denies SI or HI.  He is not experiencing any trouble sleeping or decrease in appetite.  His blood sugars have not been well controlled.  His blood pressure has been well controlled and he has had no recent episodes of chest pain, shortness of breath, palpitations, edema, or fatigue.  Outpatient Medications Prior to Visit  Medication Sig Dispense Refill  . aspirin 81 MG tablet Take 81 mg by mouth daily.      . Blood Glucose Monitoring Suppl (ACCU-CHEK GUIDE) w/Device KIT 1 Act by Does not apply route 2 (two) times daily. 2 kit 0  . Cholecalciferol (D3-50) 50000 units capsule Take 1 capsule (50,000 Units total) by mouth every 7 (seven) days. Wednesdays 12 capsule 3  . clopidogrel (PLAVIX) 75 MG tablet Take 1 tablet (75 mg total) by mouth daily. 90 tablet 3  . glucose blood (ACCU-CHEK GUIDE) test strip Use BID 100 each 11  . metoprolol tartrate (LOPRESSOR) 25 MG tablet Take 1 tablet (25 mg total) by mouth 2 (two) times daily. 180 tablet 3  . nitroGLYCERIN (NITROSTAT) 0.4 MG SL tablet Place 1 tablet (0.4 mg total) under the tongue every 5 (five) minutes as needed for chest pain. 25 tablet 3  . olmesartan (BENICAR) 20 MG tablet Take 1 tablet (20 mg total) by mouth daily. 90 tablet 3  . rosuvastatin (CRESTOR) 40 MG tablet Take 1 tablet (40 mg total) by mouth every evening. 30 tablet 11  . sitaGLIPtin-metformin (JANUMET) 50-1000 MG tablet Take 1 tablet by mouth 2 (two) times daily with a meal. 180 tablet 1  . omega-3 acid ethyl esters (LOVAZA) 1 g capsule Take 2 capsules (2 g total) by mouth 2 (two) times daily. 120 capsule 11   No facility-administered  medications prior to visit.     ROS Review of Systems  Constitutional: Negative for diaphoresis, fatigue and unexpected weight change.  HENT: Negative.   Eyes: Negative for visual disturbance.  Respiratory: Negative for cough, chest tightness, shortness of breath and wheezing.   Cardiovascular: Negative for chest pain, palpitations and leg swelling.  Gastrointestinal: Negative for abdominal pain, constipation, diarrhea, nausea and vomiting.  Endocrine: Negative for polydipsia, polyphagia and polyuria.  Genitourinary: Negative.  Negative for difficulty urinating.  Musculoskeletal: Negative.  Negative for arthralgias and neck pain.  Skin: Negative.  Negative for color change and rash.  Neurological: Negative.  Negative for dizziness, weakness, light-headedness and headaches.  Hematological: Negative for adenopathy. Does not bruise/bleed easily.  Psychiatric/Behavioral: Positive for dysphoric mood. Negative for behavioral problems, confusion, self-injury, sleep disturbance and suicidal ideas. The patient is not nervous/anxious.     Objective:  BP 120/78 (BP Location: Left Arm, Patient Position: Sitting, Cuff Size: Large)   Pulse 79   Temp 97.9 F (36.6 C) (Oral)   Ht _0  (1.676 m)   Wt 201 lb (91.2 kg)   SpO2 95%   BMI 32.44 kg/m   BP Readings from Last 3 Encounters:  06/30/17 120/78  02/28/17 136/84  12/03/16 108/74    Wt Readings from Last 3 Encounters:  06/30/17 201 lb (91.2 kg)  02/28/17 200  lb (90.7 kg)  12/03/16 197 lb 6.4 oz (89.5 kg)    Physical Exam  Constitutional: He is oriented to person, place, and time. No distress.  HENT:  Mouth/Throat: Oropharynx is clear and moist.  Eyes: Conjunctivae are normal. Left eye exhibits no discharge. No scleral icterus.  Neck: Normal range of motion. Neck supple. No JVD present. No thyromegaly present.  Cardiovascular: Normal rate, regular rhythm and normal heart sounds. Exam reveals no gallop.  No murmur  heard. Pulmonary/Chest: Effort normal and breath sounds normal. No respiratory distress. He has no wheezes. He has no rales.  Abdominal: Soft. Bowel sounds are normal. He exhibits no distension and no mass.  Musculoskeletal: Normal range of motion. He exhibits no edema or tenderness.  Lymphadenopathy:    He has no cervical adenopathy.  Neurological: He is alert and oriented to person, place, and time.  Skin: Skin is warm and dry. No rash noted. He is not diaphoretic. No erythema. No pallor.  Psychiatric: Judgment and thought content normal. His mood appears not anxious. His affect is not angry. His speech is not rapid and/or pressured, not delayed and not tangential. He is not aggressive, not slowed and not withdrawn. Cognition and memory are normal. He exhibits a depressed mood. He expresses no homicidal and no suicidal ideation. He expresses no suicidal plans and no homicidal plans.  Sad and tearful He is attentive.  Vitals reviewed.   Lab Results  Component Value Date   WBC 7.9 06/30/2017   HGB 14.1 06/30/2017   HCT 42.8 06/30/2017   PLT 231.0 06/30/2017   GLUCOSE 194 (H) 06/30/2017   CHOL 115 06/30/2017   TRIG 323.0 (H) 06/30/2017   HDL 29.00 (L) 06/30/2017   LDLDIRECT 57.0 06/30/2017   LDLCALC 35 08/21/2013   ALT 26 06/30/2017   AST 22 06/30/2017   NA 139 06/30/2017   K 3.9 06/30/2017   CL 101 06/30/2017   CREATININE 0.79 06/30/2017   BUN 13 06/30/2017   CO2 28 06/30/2017   TSH 1.75 06/30/2017   PSA 0.47 09/04/2015   INR 0.9 ratio 04/22/2009   HGBA1C 7.8 06/30/2017   MICROALBUR 14.7 (H) 06/30/2017    Dg Chest 2 View  Result Date: 08/08/2016 CLINICAL DATA:  Initial evaluation for acute right upper quadrant pain status post surgery. EXAM: CHEST  2 VIEW COMPARISON:  Prior radiograph from 07/10/2010. FINDINGS: The cardiac and mediastinal silhouettes are stable in size and contour, and remain within normal limits. Suspected underlying emphysema. No airspace consolidation,  pleural effusion, or pulmonary edema is identified. There is no pneumothorax. No acute osseous abnormality identified. IMPRESSION: No active cardiopulmonary disease. Electronically Signed   By: Jeannine Boga M.D.   On: 08/08/2016 00:29   Ct Abdomen Pelvis W Contrast  Result Date: 08/07/2016 CLINICAL DATA:  Right upper quadrant abdominal pain radiating to the back for 4 days. History of laparoscopic cholecystectomy and umbilical hernia repair 1 week ago. EXAM: CT ABDOMEN AND PELVIS WITH CONTRAST TECHNIQUE: Multidetector CT imaging of the abdomen and pelvis was performed using the standard protocol following bolus administration of intravenous contrast. CONTRAST:  148m ISOVUE-300 IOPAMIDOL (ISOVUE-300) INJECTION 61% COMPARISON:  07/12/2016 FINDINGS: Lower chest: Minimal dependent changes in the lung bases. Coronary artery calcifications. Hepatobiliary: Surgical absence of the gallbladder. No bile duct dilatation. No fluid collections or infiltrative changes demonstrated in the gallbladder fossa. Diffuse fatty infiltration of the liver. No focal liver lesions. Pancreas: Unremarkable. No pancreatic ductal dilatation or surrounding inflammatory changes. Spleen: Normal in size without  focal abnormality. Adrenals/Urinary Tract: Adrenal glands are unremarkable. Kidneys are normal, without renal calculi, focal lesion, or hydronephrosis. Bladder is unremarkable. Stomach/Bowel: Stomach is within normal limits. Appendix appears normal. No evidence of bowel wall thickening, distention, or inflammatory changes. Vascular/Lymphatic: Aortic atherosclerosis. No enlarged abdominal or pelvic lymph nodes. Reproductive: Borderline prostate size at 3.8 cm diameter. Prostate calcifications are present. Other: There is focal infiltration in the anterior abdominal wall at the level of the umbilicus, likely representing postoperative change. No loculated fluid collections. No free fluid or free air in the abdomen. Abdominal wall  musculature appears intact. Musculoskeletal: Degenerative changes in the spine. No destructive bone lesions. IMPRESSION: Postoperative cholecystectomy. No fluid or infiltration in the gallbladder fossa. No free fluid in the abdomen. Focal infiltration in the anterior abdominal wall around the umbilical region is likely postoperative. No loculated collection to suggest abscess. Diffuse fatty infiltration of the liver. No evidence of bowel obstruction or inflammation. Electronically Signed   By: Lucienne Capers M.D.   On: 08/07/2016 22:46    Assessment & Plan:   [name] was seen today for hypertension, hyperlipidemia and diabetes.  Diagnoses and all orders for this visit:  Atherosclerosis of native coronary artery of native heart without angina pectoris-he has had no recent anginal symptoms.  We will continue risk factor modifications with statin, aspirin, blood pressure control, and blood sugar control. -     Lipid panel; Future  Type 2 diabetes mellitus with complication, without long-term current use of insulin ([place])- His A1c is up to 7.8% and he has microalbuminuria.  Will add Invokana to help lower the blood sugar, help with weight reduction, and decrease the risk of progression of the microalbuminuria. -     Comprehensive metabolic panel; Future -     Urinalysis, Routine w reflex microscopic; Future -     Microalbumin / creatinine urine ratio; Future -     POCT glycosylated hemoglobin (Hb A1C) -     canagliflozin (INVOKANA) 100 MG TABS tablet; Take 1 tablet (100 mg total) by mouth daily before breakfast.  Other microscopic hematuria- There is no longer any blood in his urine and he is asymptomatic.  Continue to monitor this.  Hyperlipidemia LDL goal <70- He has achieved his LDL goal and is doing well on the statin. -     Lipid panel; Future -     TSH; Future  Pure hyperglyceridemia- His triglycerides remain elevated at 323.  I have asked him to restart the omega-3 fish oils. -      Lipid panel; Future -     omega-3 acid ethyl esters (LOVAZA) 1 g capsule; Take 2 capsules (2 g total) by mouth 2 (two) times daily.  Essential hypertension- His blood pressure is well controlled.  Electrolytes and renal function are normal. -     Comprehensive metabolic panel; Future -     CBC with Differential/Platelet; Future -     Urinalysis, Routine w reflex microscopic; Future  Grief reaction- It has been 3 months and he is still symptomatic.  He is not willing to take an antidepressant or to see a therapist.  His wife agrees to keep a close eye on him and will let me know if he wants to start an antidepressant or to consider psychotherapy.   I am having [name] start on canagliflozin. I am also having him maintain his aspirin, Cholecalciferol, clopidogrel, metoprolol tartrate, nitroGLYCERIN, rosuvastatin, sitaGLIPtin-metformin, olmesartan, ACCU-CHEK GUIDE, glucose blood, and omega-3 acid ethyl esters.  Meds ordered this  encounter  Medications  . canagliflozin (INVOKANA) 100 MG TABS tablet    Sig: Take 1 tablet (100 mg total) by mouth daily before breakfast.    Dispense:  90 tablet    Refill:  1  . omega-3 acid ethyl esters (LOVAZA) 1 g capsule    Sig: Take 2 capsules (2 g total) by mouth 2 (two) times daily.    Dispense:  360 capsule    Refill:  1     Follow-up: Return in about 3 months (around 09/28/2017).  Scarlette Calico, MD

## 2017-06-30 NOTE — Patient Instructions (Signed)

## 2017-07-05 ENCOUNTER — Telehealth: Payer: Self-pay

## 2017-07-05 NOTE — Telephone Encounter (Signed)
Key: [id number]

## 2017-07-05 NOTE — Telephone Encounter (Signed)
Will you inform pt that his PA was approved.

## 2017-07-05 NOTE — Telephone Encounter (Signed)
LVM informing patient of response  °

## 2017-09-18 ENCOUNTER — Other Ambulatory Visit: Payer: Self-pay | Admitting: Internal Medicine

## 2017-09-18 DIAGNOSIS — E559 Vitamin D deficiency, unspecified: Secondary | ICD-10-CM

## 2017-09-28 ENCOUNTER — Ambulatory Visit: Payer: 59 | Admitting: Internal Medicine

## 2017-09-28 ENCOUNTER — Encounter: Payer: Self-pay | Admitting: Internal Medicine

## 2017-09-28 VITALS — BP 138/88 | HR 74 | Temp 97.9°F | Resp 16 | Ht 66.0 in | Wt 197.2 lb

## 2017-09-28 DIAGNOSIS — Z1211 Encounter for screening for malignant neoplasm of colon: Secondary | ICD-10-CM

## 2017-09-28 DIAGNOSIS — Z23 Encounter for immunization: Secondary | ICD-10-CM | POA: Diagnosis not present

## 2017-09-28 DIAGNOSIS — R3129 Other microscopic hematuria: Secondary | ICD-10-CM

## 2017-09-28 DIAGNOSIS — E118 Type 2 diabetes mellitus with unspecified complications: Secondary | ICD-10-CM

## 2017-09-28 LAB — POCT GLYCOSYLATED HEMOGLOBIN (HGB A1C): HEMOGLOBIN A1C: 6.9

## 2017-09-28 LAB — POCT GLUCOSE (DEVICE FOR HOME USE): POC Glucose: 155 mg/dl — AB (ref 70–99)

## 2017-09-28 MED ORDER — CANAGLIFLOZIN 100 MG PO TABS: 100.0000 mg | ORAL_TABLET | Freq: Every day | ORAL | 1 refills | Status: DC

## 2017-09-28 MED ORDER — SITAGLIPTIN PHOS-METFORMIN HCL 50-1000 MG PO TABS: 1.0000 | ORAL_TABLET | Freq: Two times a day (BID) | ORAL | 1 refills | Status: DC

## 2017-09-28 NOTE — Patient Instructions (Signed)

## 2017-09-28 NOTE — Progress Notes (Signed)
Subjective:  Patient ID: [name], male    DOB: 08/15/1960  Age: 57 y.o. MRN: [id number]  CC: Hypertension and Diabetes   HPI [name] presents for f/up - he continues to complain of polydipsia but tells me for the most part his blood sugars have been well controlled.  The highest blood sugar he has seen in the last 3 weeks was 149.  He has been able to lose 4 pounds since I last saw him.  Outpatient Medications Prior to Visit  Medication Sig Dispense Refill  . aspirin 81 MG tablet Take 81 mg by mouth daily.      . Blood Glucose Monitoring Suppl (ACCU-CHEK GUIDE) w/Device KIT 1 Act by Does not apply route 2 (two) times daily. 2 kit 0  . clopidogrel (PLAVIX) 75 MG tablet Take 1 tablet (75 mg total) by mouth daily. 90 tablet 3  . D3-50 50000 units capsule TAKE 1 CAPSULE BY MOUTH EVERY WEDNESDAY. 12 capsule 0  . glucose blood (ACCU-CHEK GUIDE) test strip Use BID 100 each 11  . metoprolol tartrate (LOPRESSOR) 25 MG tablet Take 1 tablet (25 mg total) by mouth 2 (two) times daily. 180 tablet 3  . nitroGLYCERIN (NITROSTAT) 0.4 MG SL tablet Place 1 tablet (0.4 mg total) under the tongue every 5 (five) minutes as needed for chest pain. 25 tablet 3  . olmesartan (BENICAR) 20 MG tablet Take 1 tablet (20 mg total) by mouth daily. 90 tablet 3  . omega-3 acid ethyl esters (LOVAZA) 1 g capsule Take 2 capsules (2 g total) by mouth 2 (two) times daily. 360 capsule 1  . rosuvastatin (CRESTOR) 40 MG tablet Take 1 tablet (40 mg total) by mouth every evening. 30 tablet 11  . canagliflozin (INVOKANA) 100 MG TABS tablet Take 1 tablet (100 mg total) by mouth daily before breakfast. 90 tablet 1  . sitaGLIPtin-metformin (JANUMET) 50-1000 MG tablet Take 1 tablet by mouth 2 (two) times daily with a meal. 180 tablet 1   No facility-administered medications prior to visit.     ROS Review of Systems  Constitutional: Negative for appetite change, diaphoresis, fatigue and unexpected weight change.  HENT:  Negative.   Eyes: Negative for visual disturbance.  Respiratory: Negative for cough, chest tightness, shortness of breath and wheezing.   Cardiovascular: Negative for chest pain, palpitations and leg swelling.  Gastrointestinal: Negative for abdominal pain, diarrhea, nausea and vomiting.  Endocrine: Positive for polydipsia. Negative for polyphagia and polyuria.  Genitourinary: Negative.  Negative for difficulty urinating and dysuria.  Musculoskeletal: Negative.  Negative for back pain, myalgias and neck pain.  Skin: Negative.  Negative for color change, pallor and rash.  Neurological: Negative.  Negative for dizziness, weakness and light-headedness.  Hematological: Negative for adenopathy. Does not bruise/bleed easily.  Psychiatric/Behavioral: Negative.     Objective:  BP 138/88 (BP Location: Left Arm, Patient Position: Sitting, Cuff Size: Normal)   Pulse 74   Temp 97.9 F (36.6 C) (Oral)   Resp 16   Ht 5' 6"  (1.676 m)   Wt 197 lb 4 oz (89.5 kg)   SpO2 94%   BMI 31.84 kg/m   BP Readings from Last 3 Encounters:  09/28/17 138/88  06/30/17 120/78  02/28/17 136/84    Wt Readings from Last 3 Encounters:  09/28/17 197 lb 4 oz (89.5 kg)  06/30/17 201 lb (91.2 kg)  02/28/17 200 lb (90.7 kg)    Physical Exam  Constitutional: He is oriented to person, place, and time. No distress.  HENT:  Mouth/Throat: Oropharynx is clear and moist. No oropharyngeal exudate.  Eyes: Conjunctivae are normal.  Neck: Normal range of motion. Neck supple. No JVD present.  Cardiovascular: Regular rhythm, normal heart sounds and intact distal pulses. Exam reveals no gallop and no friction rub.  No murmur heard. Pulmonary/Chest: Effort normal and breath sounds normal. No stridor. No respiratory distress. He has no wheezes. He has no rales.  Abdominal: Soft. Bowel sounds are normal. He exhibits no distension. There is no tenderness. There is no rebound.  Musculoskeletal: Normal range of motion. He  exhibits no edema, tenderness or deformity.  Neurological: He is alert and oriented to person, place, and time.  Skin: Skin is warm and dry. He is not diaphoretic.  Vitals reviewed.   Lab Results  Component Value Date   WBC 7.9 06/30/2017   HGB 14.1 06/30/2017   HCT 42.8 06/30/2017   PLT 231.0 06/30/2017   GLUCOSE 194 (H) 06/30/2017   CHOL 115 06/30/2017   TRIG 323.0 (H) 06/30/2017   HDL 29.00 (L) 06/30/2017   LDLDIRECT 57.0 06/30/2017   LDLCALC 35 08/21/2013   ALT 26 06/30/2017   AST 22 06/30/2017   NA 139 06/30/2017   K 3.9 06/30/2017   CL 101 06/30/2017   CREATININE 0.79 06/30/2017   BUN 13 06/30/2017   CO2 28 06/30/2017   TSH 1.75 06/30/2017   PSA 0.47 09/04/2015   INR 0.9 ratio 04/22/2009   HGBA1C 6.9 09/28/2017   MICROALBUR 14.7 (H) 06/30/2017    Dg Chest 2 View  Result Date: 08/08/2016 CLINICAL DATA:  Initial evaluation for acute right upper quadrant pain status post surgery. EXAM: CHEST  2 VIEW COMPARISON:  Prior radiograph from 07/10/2010. FINDINGS: The cardiac and mediastinal silhouettes are stable in size and contour, and remain within normal limits. Suspected underlying emphysema. No airspace consolidation, pleural effusion, or pulmonary edema is identified. There is no pneumothorax. No acute osseous abnormality identified. IMPRESSION: No active cardiopulmonary disease. Electronically Signed   By: Jeannine Boga M.D.   On: 08/08/2016 00:29   Ct Abdomen Pelvis W Contrast  Result Date: 08/07/2016 CLINICAL DATA:  Right upper quadrant abdominal pain radiating to the back for 4 days. History of laparoscopic cholecystectomy and umbilical hernia repair 1 week ago. EXAM: CT ABDOMEN AND PELVIS WITH CONTRAST TECHNIQUE: Multidetector CT imaging of the abdomen and pelvis was performed using the standard protocol following bolus administration of intravenous contrast. CONTRAST:  174m ISOVUE-300 IOPAMIDOL (ISOVUE-300) INJECTION 61% COMPARISON:  07/12/2016 FINDINGS: Lower  chest: Minimal dependent changes in the lung bases. Coronary artery calcifications. Hepatobiliary: Surgical absence of the gallbladder. No bile duct dilatation. No fluid collections or infiltrative changes demonstrated in the gallbladder fossa. Diffuse fatty infiltration of the liver. No focal liver lesions. Pancreas: Unremarkable. No pancreatic ductal dilatation or surrounding inflammatory changes. Spleen: Normal in size without focal abnormality. Adrenals/Urinary Tract: Adrenal glands are unremarkable. Kidneys are normal, without renal calculi, focal lesion, or hydronephrosis. Bladder is unremarkable. Stomach/Bowel: Stomach is within normal limits. Appendix appears normal. No evidence of bowel wall thickening, distention, or inflammatory changes. Vascular/Lymphatic: Aortic atherosclerosis. No enlarged abdominal or pelvic lymph nodes. Reproductive: Borderline prostate size at 3.8 cm diameter. Prostate calcifications are present. Other: There is focal infiltration in the anterior abdominal wall at the level of the umbilicus, likely representing postoperative change. No loculated fluid collections. No free fluid or free air in the abdomen. Abdominal wall musculature appears intact. Musculoskeletal: Degenerative changes in the spine. No destructive bone lesions. IMPRESSION: Postoperative cholecystectomy.  No fluid or infiltration in the gallbladder fossa. No free fluid in the abdomen. Focal infiltration in the anterior abdominal wall around the umbilical region is likely postoperative. No loculated collection to suggest abscess. Diffuse fatty infiltration of the liver. No evidence of bowel obstruction or inflammation. Electronically Signed   By: Lucienne Capers M.D.   On: 08/07/2016 22:46    Assessment & Plan:   [name] was seen today for hypertension and diabetes.  Diagnoses and all orders for this visit:  Other microscopic hematuria  Screen for colon cancer -     Ambulatory referral to  Gastroenterology  Type 2 diabetes mellitus with complication, without long-term current use of insulin ([place])- His A1c is down to 6.9%.  His blood sugars are improved.  I have asked him to continue the combination of a DPP4 inhibitor, SGLT-2 inhibitor, and metformin. -     sitaGLIPtin-metformin (JANUMET) 50-1000 MG tablet; Take 1 tablet by mouth 2 (two) times daily with a meal. -     canagliflozin (INVOKANA) 100 MG TABS tablet; Take 1 tablet (100 mg total) by mouth daily before breakfast. -     POCT glycosylated hemoglobin (Hb A1C) -     POCT Glucose (Device for Home Use)  Need for pneumococcal vaccination -     Pneumococcal polysaccharide vaccine 23-valent greater than or equal to 2yo subcutaneous/IM   I am having [name] maintain his aspirin, clopidogrel, metoprolol tartrate, nitroGLYCERIN, rosuvastatin, olmesartan, ACCU-CHEK GUIDE, glucose blood, omega-3 acid ethyl esters, D3-50, sitaGLIPtin-metformin, and canagliflozin.  Meds ordered this encounter  Medications  . sitaGLIPtin-metformin (JANUMET) 50-1000 MG tablet    Sig: Take 1 tablet by mouth 2 (two) times daily with a meal.    Dispense:  180 tablet    Refill:  1  . canagliflozin (INVOKANA) 100 MG TABS tablet    Sig: Take 1 tablet (100 mg total) by mouth daily before breakfast.    Dispense:  90 tablet    Refill:  1     Follow-up: Return in about 6 months (around 03/30/2018).  Scarlette Calico, MD

## 2017-11-28 ENCOUNTER — Ambulatory Visit: Payer: 59 | Admitting: Cardiovascular Disease

## 2017-11-28 ENCOUNTER — Encounter: Payer: Self-pay | Admitting: Cardiovascular Disease

## 2017-11-28 ENCOUNTER — Encounter (INDEPENDENT_AMBULATORY_CARE_PROVIDER_SITE_OTHER): Payer: Self-pay

## 2017-11-28 VITALS — BP 124/80 | HR 73 | Ht 66.0 in | Wt 195.2 lb

## 2017-11-28 DIAGNOSIS — E782 Mixed hyperlipidemia: Secondary | ICD-10-CM

## 2017-11-28 DIAGNOSIS — I2510 Atherosclerotic heart disease of native coronary artery without angina pectoris: Secondary | ICD-10-CM

## 2017-11-28 DIAGNOSIS — I10 Essential (primary) hypertension: Secondary | ICD-10-CM

## 2017-11-28 NOTE — Progress Notes (Signed)
Cardiology Office Note Date:  11/28/2017   ID:  [name], DOB 06/18/1961, MRN [id number]  PCP:  Janith Lima, MD  Cardiologist:  Sherren Mocha, MD    Chief Complaint  Patient presents with  . Coronary Artery Disease     History of Present Illness: [name] is a 57 y.o. male who presents for follow-up of coronary artery disease. The patient has had multiple PCI procedures. Other comorbid medical conditions include hypertension, hyperlipidemia, and Type 2 diabetes. His most recent heart catheterization in 2008 demonstrated normal LV systolic function, continued patency of the stented segments in the RCA and distal left circumflex, and nonobstructive disease elsewhere. He has been managed medically since that time.  The patient is here with his family today.  He is doing quite well.  He denies chest pain, shortness of breath, leg swelling, or heart palpitations.  He is physically active and does a lot of hard work.  He is up and down stairs many times every day and has no symptoms with that level of exertion.  His cardiac medications have not changed.   Past Medical History:  Diagnosis Date  . Asthma   . Coronary artery disease    s/p BMS to RCA and CFX. s/p DES to RCA 2/2 ISR 2010  . Diabetes mellitus without complication ([place])   . History of kidney stones    20 yrs ago   . Hyperlipidemia   . Hypertension   . Old myocardial infarction   . Paroxysmal ventricular tachycardia ([place])   . Sleep apnea    not using cpap    5-6 yrs     Past Surgical History:  Procedure Laterality Date  . CARDIAC CATHETERIZATION    . CHOLECYSTECTOMY N/A 07/28/2016   Procedure: LAPAROSCOPIC CHOLECYSTECTOMY;  Surgeon: Coralie Keens, MD;  Location: [facility];  Service: General;  Laterality: N/A;  . CORONARY STENT PLACEMENT  2002, 2007, 2010   right  . UMBILICAL HERNIA REPAIR N/A 07/28/2016   Procedure: HERNIA REPAIR UMBILICAL ADULT;  Surgeon: Coralie Keens, MD;  Location: [facility];  Service:  General;  Laterality: N/A;    Current Outpatient Medications  Medication Sig Dispense Refill  . aspirin 81 MG tablet Take 81 mg by mouth daily.      . Blood Glucose Monitoring Suppl (ACCU-CHEK GUIDE) w/Device KIT 1 Act by Does not apply route 2 (two) times daily. 2 kit 0  . canagliflozin (INVOKANA) 100 MG TABS tablet Take 1 tablet (100 mg total) by mouth daily before breakfast. 90 tablet 1  . clopidogrel (PLAVIX) 75 MG tablet Take 1 tablet (75 mg total) by mouth daily. 90 tablet 3  . D3-50 50000 units capsule TAKE 1 CAPSULE BY MOUTH EVERY WEDNESDAY. 12 capsule 0  . glucose blood (ACCU-CHEK GUIDE) test strip Use BID 100 each 11  . metoprolol tartrate (LOPRESSOR) 25 MG tablet Take 1 tablet (25 mg total) by mouth 2 (two) times daily. 180 tablet 3  . nitroGLYCERIN (NITROSTAT) 0.4 MG SL tablet Place 1 tablet (0.4 mg total) under the tongue every 5 (five) minutes as needed for chest pain. 25 tablet 3  . olmesartan (BENICAR) 20 MG tablet Take 1 tablet (20 mg total) by mouth daily. 90 tablet 3  . omega-3 acid ethyl esters (LOVAZA) 1 g capsule Take 2 capsules (2 g total) by mouth 2 (two) times daily. 360 capsule 1  . rosuvastatin (CRESTOR) 40 MG tablet Take 1 tablet (40 mg total) by mouth every evening. 30 tablet 11  .  sitaGLIPtin-metformin (JANUMET) 50-1000 MG tablet Take 1 tablet by mouth 2 (two) times daily with a meal. 180 tablet 1   No current facility-administered medications for this visit.     Allergies:   Heparin   Social History:  The patient  reports that he quit smoking about 6 years ago. His smoking use included cigarettes. He has a 45.00 pack-year smoking history. He has never used smokeless tobacco. He reports that he does not drink alcohol or use drugs.   Family History:  The patient's family history includes Alzheimer's disease in his mother; Diabetes in his mother and sister; Heart disease in his brother, mother, and sister; Kidney disease in his sister; Peripheral vascular disease  in his sister.    ROS:  Please see the history of present illness.  All other systems are reviewed and negative.    PHYSICAL EXAM: VS:  BP 124/80   Pulse 73   Ht 5' 6"  (1.676 m)   Wt 195 lb 4 oz (88.6 kg)   SpO2 95%   BMI 31.51 kg/m  , BMI Body mass index is 31.51 kg/m. GEN: Well nourished, well developed, in no acute distress  HEENT: normal  Neck: no JVD, no masses. No carotid bruits Cardiac: RRR without murmur or gallop                Respiratory:  clear to auscultation bilaterally, normal work of breathing GI: soft, nontender, nondistended, + BS MS: no deformity or atrophy  Ext: no pretibial edema, pedal pulses 2+= bilaterally Skin: warm and dry, no rash Neuro:  Strength and sensation are intact Psych: euthymic mood, full affect  EKG:  EKG is ordered today. The ekg ordered today shows normal sinus rhythm 66 bpm, nonspecific ST/T wave abnormality.  Recent Labs: 06/30/2017: ALT 26; BUN 13; Creatinine, Ser 0.79; Hemoglobin 14.1; Platelets 231.0; Potassium 3.9; Sodium 139; TSH 1.75   Lipid Panel     Component Value Date/Time   CHOL 115 06/30/2017 0937   CHOL 219 08/20/2012   TRIG 323.0 (H) 06/30/2017 0937   TRIG 402 08/20/2012   HDL 29.00 (L) 06/30/2017 0937   CHOLHDL 4 06/30/2017 0937   VLDL 64.6 (H) 06/30/2017 0937   LDLCALC 35 08/21/2013 0850   LDLCALC 107 08/20/2012   LDLDIRECT 57.0 06/30/2017 0937      Wt Readings from Last 3 Encounters:  11/28/17 195 lb 4 oz (88.6 kg)  09/28/17 197 lb 4 oz (89.5 kg)  06/30/17 201 lb (91.2 kg)    ASSESSMENT AND PLAN: 1.  Coronary artery disease, native vessel, without angina: The patient is stable.  He continues on dual antiplatelet therapy without bleeding complications.  He is been maintained on long-term dual antiplatelet therapy with multiple stents (BMS RCA, BMS LCx, and DES in RCA inside of BMS).  No medicine changes are made today.  He is also treated with a beta-blocker, ARB, and high intensity statin drug.  2.   Hypertension: Blood pressure is well controlled on a combination of metoprolol tartrate, olmesartan.  3.  Hyperlipidemia: LDL cholesterol is less than 70 mg/dL on Crestor 40 mg daily.  4.  Type 2 diabetes: His last hemoglobin A1c is 6.9 mg/dL.  He is treated with oral hypoglycemic agents and followed by his primary physician.  Current medicines are reviewed with the patient today.  The patient does not have concerns regarding medicines.  Labs/ tests ordered today include:   Orders Placed This Encounter  Procedures  . EKG 12-Lead  Disposition:   FU one year  Signed, Sherren Mocha, MD  11/28/2017 9:17 AM    [address], [place], [place]  [address] Phone: [phone]; Fax: [phone]

## 2017-11-28 NOTE — Patient Instructions (Signed)

## 2017-12-05 ENCOUNTER — Other Ambulatory Visit: Payer: Self-pay | Admitting: Cardiovascular Disease

## 2017-12-05 DIAGNOSIS — I2510 Atherosclerotic heart disease of native coronary artery without angina pectoris: Secondary | ICD-10-CM

## 2017-12-08 ENCOUNTER — Other Ambulatory Visit: Payer: Self-pay | Admitting: Internal Medicine

## 2017-12-08 DIAGNOSIS — E559 Vitamin D deficiency, unspecified: Secondary | ICD-10-CM

## 2017-12-13 ENCOUNTER — Encounter: Payer: Self-pay | Admitting: Internal Medicine

## 2017-12-14 ENCOUNTER — Other Ambulatory Visit: Payer: Self-pay | Admitting: Internal Medicine

## 2017-12-14 DIAGNOSIS — E781 Pure hyperglyceridemia: Secondary | ICD-10-CM

## 2018-01-02 ENCOUNTER — Other Ambulatory Visit: Payer: Self-pay | Admitting: Cardiovascular Disease

## 2018-01-12 ENCOUNTER — Other Ambulatory Visit: Payer: Self-pay | Admitting: Cardiovascular Disease

## 2018-01-12 DIAGNOSIS — E781 Pure hyperglyceridemia: Secondary | ICD-10-CM

## 2018-01-12 DIAGNOSIS — I10 Essential (primary) hypertension: Secondary | ICD-10-CM

## 2018-03-30 ENCOUNTER — Encounter: Payer: Self-pay | Admitting: Internal Medicine

## 2018-03-30 ENCOUNTER — Other Ambulatory Visit (INDEPENDENT_AMBULATORY_CARE_PROVIDER_SITE_OTHER): Payer: 59

## 2018-03-30 ENCOUNTER — Ambulatory Visit: Payer: 59 | Admitting: Internal Medicine

## 2018-03-30 VITALS — BP 132/72 | HR 77 | Temp 98.0°F | Ht 66.0 in | Wt 197.5 lb

## 2018-03-30 DIAGNOSIS — I10 Essential (primary) hypertension: Secondary | ICD-10-CM

## 2018-03-30 DIAGNOSIS — E118 Type 2 diabetes mellitus with unspecified complications: Secondary | ICD-10-CM

## 2018-03-30 DIAGNOSIS — Z0000 Encounter for general adult medical examination without abnormal findings: Secondary | ICD-10-CM

## 2018-03-30 DIAGNOSIS — Z23 Encounter for immunization: Secondary | ICD-10-CM

## 2018-03-30 DIAGNOSIS — E559 Vitamin D deficiency, unspecified: Secondary | ICD-10-CM | POA: Diagnosis not present

## 2018-03-30 DIAGNOSIS — E781 Pure hyperglyceridemia: Secondary | ICD-10-CM

## 2018-03-30 DIAGNOSIS — R3129 Other microscopic hematuria: Secondary | ICD-10-CM

## 2018-03-30 DIAGNOSIS — Z1211 Encounter for screening for malignant neoplasm of colon: Secondary | ICD-10-CM

## 2018-03-30 DIAGNOSIS — Z1212 Encounter for screening for malignant neoplasm of rectum: Secondary | ICD-10-CM

## 2018-03-30 DIAGNOSIS — F320 Major depressive disorder, single episode, mild: Secondary | ICD-10-CM

## 2018-03-30 LAB — URINALYSIS, ROUTINE W REFLEX MICROSCOPIC
BILIRUBIN URINE: NEGATIVE
Hgb urine dipstick: NEGATIVE
KETONES UR: NEGATIVE
LEUKOCYTES UA: NEGATIVE
Nitrite: NEGATIVE
PH: 6 (ref 5.0–8.0)
RBC / HPF: NONE SEEN (ref 0–?)
SPECIFIC GRAVITY, URINE: 1.015 (ref 1.000–1.030)
UROBILINOGEN UA: 0.2 (ref 0.0–1.0)

## 2018-03-30 LAB — LIPID PANEL
Cholesterol: 112 mg/dL (ref 0–200)
HDL: 32.8 mg/dL — AB (ref >39.00)
NONHDL: 78.92
Total CHOL/HDL Ratio: 3
Triglycerides: 335 mg/dL — ABNORMAL HIGH (ref 0.0–149.0)
VLDL: 67 mg/dL — ABNORMAL HIGH (ref 0.0–40.0)

## 2018-03-30 LAB — POCT GLYCOSYLATED HEMOGLOBIN (HGB A1C): Hemoglobin A1C: 7.4 % — AB (ref 4.0–5.6)

## 2018-03-30 LAB — BASIC METABOLIC PANEL
BUN: 13 mg/dL (ref 6–23)
CALCIUM: 10.2 mg/dL (ref 8.4–10.5)
CO2: 27 mEq/L (ref 19–32)
CREATININE: 0.75 mg/dL (ref 0.40–1.50)
Chloride: 101 mEq/L (ref 96–112)
GFR: 114.97 mL/min (ref >60.00)
Glucose, Bld: 162 mg/dL — ABNORMAL HIGH (ref 70–99)
Potassium: 3.8 mEq/L (ref 3.5–5.1)
Sodium: 139 mEq/L (ref 135–145)

## 2018-03-30 LAB — LDL CHOLESTEROL, DIRECT: Direct LDL: 45 mg/dL

## 2018-03-30 LAB — HEMOGLOBIN A1C: HEMOGLOBIN A1C: 7.4 % — AB (ref 4.6–6.5)

## 2018-03-30 LAB — PSA: PSA: 0.29 ng/mL (ref 0.10–4.00)

## 2018-03-30 MED ORDER — VORTIOXETINE HBR 5 MG PO TABS: 5.0000 mg | ORAL_TABLET | Freq: Every day | ORAL | 0 refills | Status: DC

## 2018-03-30 MED ORDER — OLMESARTAN MEDOXOMIL 20 MG PO TABS: 20.0000 mg | ORAL_TABLET | Freq: Every day | ORAL | 3 refills | Status: DC

## 2018-03-30 MED ORDER — CHOLECALCIFEROL 1.25 MG (50000 UT) PO CAPS: 50000.0000 [IU] | ORAL_CAPSULE | ORAL | 0 refills | Status: DC

## 2018-03-30 MED ORDER — OMEGA-3-ACID ETHYL ESTERS 1 G PO CAPS: 2.0000 | ORAL_CAPSULE | Freq: Two times a day (BID) | ORAL | 1 refills | Status: DC

## 2018-03-30 MED ORDER — VORTIOXETINE HBR 10 MG PO TABS: 10.0000 mg | ORAL_TABLET | Freq: Every day | ORAL | 0 refills | Status: DC

## 2018-03-30 MED ORDER — CANAGLIFLOZIN 300 MG PO TABS: 300.0000 mg | ORAL_TABLET | Freq: Every day | ORAL | 1 refills | Status: DC

## 2018-03-30 MED ORDER — CANAGLIFLOZIN 100 MG PO TABS: 100.0000 mg | ORAL_TABLET | Freq: Every day | ORAL | 1 refills | Status: DC

## 2018-03-30 NOTE — Patient Instructions (Signed)

## 2018-03-30 NOTE — Progress Notes (Addendum)
Subjective:  Patient ID: [name], male    DOB: [birth year]  Age: 57 y.o. MRN: [id number]  CC: Annual Exam; Hypertension; Hyperlipidemia; and Diabetes   HPI Aki Abalos presents for a CPX.  He complains of depression and his wife is concerned about him.  He lost his son about a year ago and feels like he has not recovered from it.  He complains of excessive somnolence, irritability, and anhedonia.  He denies insomnia, anxiety, SI, or HI.  He has never been treated for depression before.  He does not know if his blood sugars are well controlled.  He denies polys.  He has not been monitoring his blood sugars recently.  He tells me he is compliant with the listed medications.  Outpatient Medications Prior to Visit  Medication Sig Dispense Refill  . aspirin 81 MG tablet Take 81 mg by mouth daily.      . Blood Glucose Monitoring Suppl (ACCU-CHEK GUIDE) w/Device KIT 1 Act by Does not apply route 2 (two) times daily. 2 kit 0  . clopidogrel (PLAVIX) 75 MG tablet TAKE 1 TABLET BY MOUTH EVERY DAY 90 tablet 3  . glucose blood (ACCU-CHEK GUIDE) test strip Use BID 100 each 11  . metoprolol tartrate (LOPRESSOR) 25 MG tablet TAKE 1 TABLET BY MOUTH TWICE A DAY 180 tablet 2  . nitroGLYCERIN (NITROSTAT) 0.4 MG SL tablet Place 1 tablet (0.4 mg total) under the tongue every 5 (five) minutes as needed for chest pain. 25 tablet 3  . rosuvastatin (CRESTOR) 40 MG tablet TAKE 1 TABLET BY MOUTH EVERY DAY IN THE EVENING 90 tablet 3  . sitaGLIPtin-metformin (JANUMET) 50-1000 MG tablet Take 1 tablet by mouth 2 (two) times daily with a meal. 180 tablet 1  . canagliflozin (INVOKANA) 100 MG TABS tablet Take 1 tablet (100 mg total) by mouth daily before breakfast. 90 tablet 1  . D3-50 50000 units capsule TAKE 1 CAPSULE BY MOUTH EVERY WEDNESDAY. 12 capsule 0  . olmesartan (BENICAR) 20 MG tablet Take 1 tablet (20 mg total) by mouth daily. 90 tablet 3  . omega-3 acid ethyl esters (LOVAZA) 1 g capsule TAKE 2  CAPSULES (2 G TOTAL) BY MOUTH 2 (TWO) TIMES DAILY. 360 capsule 1   No facility-administered medications prior to visit.     ROS Review of Systems  Constitutional: Negative.  Negative for diaphoresis, fatigue and unexpected weight change.  HENT: Negative.   Eyes: Negative for visual disturbance.  Respiratory: Negative for cough, chest tightness, shortness of breath and wheezing.   Cardiovascular: Negative for chest pain, palpitations and leg swelling.  Gastrointestinal: Negative for abdominal pain, constipation, diarrhea, nausea and vomiting.  Endocrine: Negative for polydipsia, polyphagia and polyuria.  Genitourinary: Negative.  Negative for difficulty urinating, dysuria, penile swelling, scrotal swelling, testicular pain and urgency.  Musculoskeletal: Negative.  Negative for arthralgias, back pain and myalgias.  Skin: Negative.   Neurological: Negative.  Negative for dizziness, weakness, light-headedness and headaches.  Hematological: Negative for adenopathy. Does not bruise/bleed easily.  Psychiatric/Behavioral: Positive for decreased concentration, dysphoric mood and sleep disturbance. Negative for agitation, behavioral problems, confusion, hallucinations, self-injury and suicidal ideas. The patient is not nervous/anxious and is not hyperactive.     Objective:  BP 132/72 (BP Location: Right Arm, Patient Position: Sitting, Cuff Size: Normal)   Pulse 77   Temp 98 F (36.7 C) (Oral)   Ht _0  (1.676 m)   Wt 197 lb 8 oz (89.6 kg)   SpO2 97%   BMI 31.88  kg/m   BP Readings from Last 3 Encounters:  03/30/18 132/72  11/28/17 124/80  09/28/17 138/88    Wt Readings from Last 3 Encounters:  03/30/18 197 lb 8 oz (89.6 kg)  11/28/17 195 lb 4 oz (88.6 kg)  09/28/17 197 lb 4 oz (89.5 kg)    Physical Exam  Constitutional: He is oriented to person, place, and time. No distress.  HENT:  Mouth/Throat: Oropharynx is clear and moist. No oropharyngeal exudate.  Eyes: Conjunctivae are  normal. No scleral icterus.  Neck: Normal range of motion. Neck supple. No JVD present. No thyromegaly present.  Cardiovascular: Normal rate, regular rhythm and normal heart sounds. Exam reveals no gallop.  No murmur heard. Pulmonary/Chest: Effort normal and breath sounds normal. No respiratory distress. He has no wheezes. He has no rales.  Abdominal: Soft. Normal appearance and bowel sounds are normal. There is no hepatosplenomegaly. There is no tenderness. Hernia confirmed negative in the right inguinal area and confirmed negative in the left inguinal area.  Genitourinary: Rectum normal, prostate normal, testes normal and penis normal. Rectal exam shows no external hemorrhoid, no internal hemorrhoid, no fissure, no mass, no tenderness, anal tone normal and guaiac negative stool. Prostate is not enlarged and not tender. Right testis shows no mass, no swelling and no tenderness. Left testis shows no mass, no swelling and no tenderness. Circumcised. No penile erythema or penile tenderness. No discharge found.  Musculoskeletal: Normal range of motion. He exhibits no edema, tenderness or deformity.  Lymphadenopathy:    He has no cervical adenopathy. No inguinal adenopathy noted on the right or left side.  Neurological: He is alert and oriented to person, place, and time.  Skin: Skin is warm and dry. No rash noted. He is not diaphoretic.  Psychiatric: Judgment and thought content normal. His mood appears not anxious. His affect is not blunt, not labile and not inappropriate. His speech is delayed. His speech is not rapid and/or pressured and not tangential. He is slowed and withdrawn. He is not agitated, not aggressive, not hyperactive and not combative. Thought content is not paranoid and not delusional. Cognition and memory are normal. He exhibits a depressed mood. He expresses no homicidal and no suicidal ideation.  Sad and tearful  Vitals reviewed.   Lab Results  Component Value Date   WBC 7.9  06/30/2017   HGB 14.1 06/30/2017   HCT 42.8 06/30/2017   PLT 231.0 06/30/2017   GLUCOSE 162 (H) 03/30/2018   CHOL 112 03/30/2018   TRIG 335.0 (H) 03/30/2018   HDL 32.80 (L) 03/30/2018   LDLDIRECT 45.0 03/30/2018   LDLCALC 35 08/21/2013   ALT 26 06/30/2017   AST 22 06/30/2017   NA 139 03/30/2018   K 3.8 03/30/2018   CL 101 03/30/2018   CREATININE 0.75 03/30/2018   BUN 13 03/30/2018   CO2 27 03/30/2018   TSH 1.75 06/30/2017   PSA 0.29 03/30/2018   INR 0.9 ratio 04/22/2009   HGBA1C 7.4 (H) 03/30/2018   MICROALBUR 14.7 (H) 06/30/2017    Dg Chest 2 View  Result Date: 08/08/2016 CLINICAL DATA:  Initial evaluation for acute right upper quadrant pain status post surgery. EXAM: CHEST  2 VIEW COMPARISON:  Prior radiograph from 07/10/2010. FINDINGS: The cardiac and mediastinal silhouettes are stable in size and contour, and remain within normal limits. Suspected underlying emphysema. No airspace consolidation, pleural effusion, or pulmonary edema is identified. There is no pneumothorax. No acute osseous abnormality identified. IMPRESSION: No active cardiopulmonary disease. Electronically Signed  By: Jeannine Boga M.D.   On: 08/08/2016 00:29   Ct Abdomen Pelvis W Contrast  Result Date: 08/07/2016 CLINICAL DATA:  Right upper quadrant abdominal pain radiating to the back for 4 days. History of laparoscopic cholecystectomy and umbilical hernia repair 1 week ago. EXAM: CT ABDOMEN AND PELVIS WITH CONTRAST TECHNIQUE: Multidetector CT imaging of the abdomen and pelvis was performed using the standard protocol following bolus administration of intravenous contrast. CONTRAST:  1105m ISOVUE-300 IOPAMIDOL (ISOVUE-300) INJECTION 61% COMPARISON:  07/12/2016 FINDINGS: Lower chest: Minimal dependent changes in the lung bases. Coronary artery calcifications. Hepatobiliary: Surgical absence of the gallbladder. No bile duct dilatation. No fluid collections or infiltrative changes demonstrated in the  gallbladder fossa. Diffuse fatty infiltration of the liver. No focal liver lesions. Pancreas: Unremarkable. No pancreatic ductal dilatation or surrounding inflammatory changes. Spleen: Normal in size without focal abnormality. Adrenals/Urinary Tract: Adrenal glands are unremarkable. Kidneys are normal, without renal calculi, focal lesion, or hydronephrosis. Bladder is unremarkable. Stomach/Bowel: Stomach is within normal limits. Appendix appears normal. No evidence of bowel wall thickening, distention, or inflammatory changes. Vascular/Lymphatic: Aortic atherosclerosis. No enlarged abdominal or pelvic lymph nodes. Reproductive: Borderline prostate size at 3.8 cm diameter. Prostate calcifications are present. Other: There is focal infiltration in the anterior abdominal wall at the level of the umbilicus, likely representing postoperative change. No loculated fluid collections. No free fluid or free air in the abdomen. Abdominal wall musculature appears intact. Musculoskeletal: Degenerative changes in the spine. No destructive bone lesions. IMPRESSION: Postoperative cholecystectomy. No fluid or infiltration in the gallbladder fossa. No free fluid in the abdomen. Focal infiltration in the anterior abdominal wall around the umbilical region is likely postoperative. No loculated collection to suggest abscess. Diffuse fatty infiltration of the liver. No evidence of bowel obstruction or inflammation. Electronically Signed   By: WLucienne CapersM.D.   On: 08/07/2016 22:46    Assessment & Plan:   KTeaghanwas seen today for annual exam, hypertension, hyperlipidemia and diabetes.  Diagnoses and all orders for this visit:  Essential hypertension- His blood pressure is well controlled.  Electrolytes and renal function are normal. -     Urinalysis, Routine w reflex microscopic; Future -     Basic metabolic panel; Future  Type II diabetes mellitus with manifestations ([place]- His A1c is up to 7.4%.  His blood sugars  are not adequately well controlled.  In addition to improving his lifestyle modifications I have asked him to increase his dose of Invokana from 100 to 300 mg a day.  He will continue his other hypoglycemic agents. -     Hemoglobin A1c; Future -     Basic metabolic panel; Future -     POCT glycosylated hemoglobin (Hb A1C) -     HM Diabetes Foot Exam -     canagliflozin (INVOKANA) 300 MG TABS tablet; Take 1 tablet (300 mg total) by mouth daily before breakfast.  Other microscopic hematuria  Pure hyperglyceridemia- His triglycerides remain elevated.  I have asked him to restart LOVAZA and to improve his lifestyle modifications. -     omega-3 acid ethyl esters (LOVAZA) 1 g capsule; Take 2 capsules (2 g total) by mouth 2 (two) times daily.  Screen for colon cancer -     Cologuard  Routine general medical examination at a health care facility- Exam completed, labs reviewed, vaccines reviewed and updated, Cologuard was ordered to screen for colon cancer/polyps, patient education material was given. -     Lipid panel; Future -  PSA; Future  Type 2 diabetes mellitus with complication, without long-term current use of insulin (HCC) -     olmesartan (BENICAR) 20 MG tablet; Take 1 tablet (20 mg total) by mouth daily. -     Discontinue: canagliflozin (INVOKANA) 100 MG TABS tablet; Take 1 tablet (100 mg total) by mouth daily before breakfast.  Vitamin D deficiency -     Cholecalciferol (D3-50) 50000 units capsule; Take 1 capsule (50,000 Units total) by mouth once a week.  Current mild episode of major depressive disorder without prior episode ([place])- I recommended that he start treating this with Trintellix.  Will start at 5 mg a day for a week and then advance to 10 mg a day for 3 additional weeks.  At the end of that we will decide whether or not he stays at 10 mg or goes up to 20 mg a day. -     vortioxetine HBr (TRINTELLIX) 5 MG TABS tablet; Take 1 tablet (5 mg total) by mouth daily. -      vortioxetine HBr (TRINTELLIX) 10 MG TABS tablet; Take 1 tablet (10 mg total) by mouth daily.  Need for influenza vaccination -     Flu Vaccine QUAD 36+ mos IM  Screening for malignant neoplasm of the rectum -     Cologuard   I have discontinued Oseph Cottone's canagliflozin and canagliflozin. I have also changed his D3-50 to Cholecalciferol. Additionally, I am having him start on vortioxetine HBr, vortioxetine HBr, and canagliflozin. Lastly, I am having him maintain his aspirin, nitroGLYCERIN, ACCU-CHEK GUIDE, glucose blood, sitaGLIPtin-metformin, clopidogrel, rosuvastatin, metoprolol tartrate, olmesartan, and omega-3 acid ethyl esters.  Meds ordered this encounter  Medications  . olmesartan (BENICAR) 20 MG tablet    Sig: Take 1 tablet (20 mg total) by mouth daily.    Dispense:  90 tablet    Refill:  3  . omega-3 acid ethyl esters (LOVAZA) 1 g capsule    Sig: Take 2 capsules (2 g total) by mouth 2 (two) times daily.    Dispense:  360 capsule    Refill:  1  . DISCONTD: canagliflozin (INVOKANA) 100 MG TABS tablet    Sig: Take 1 tablet (100 mg total) by mouth daily before breakfast.    Dispense:  90 tablet    Refill:  1  . Cholecalciferol (D3-50) 50000 units capsule    Sig: Take 1 capsule (50,000 Units total) by mouth once a week.    Dispense:  12 capsule    Refill:  0  . vortioxetine HBr (TRINTELLIX) 5 MG TABS tablet    Sig: Take 1 tablet (5 mg total) by mouth daily.    Dispense:  7 tablet    Refill:  0  . vortioxetine HBr (TRINTELLIX) 10 MG TABS tablet    Sig: Take 1 tablet (10 mg total) by mouth daily.    Dispense:  21 tablet    Refill:  0  . canagliflozin (INVOKANA) 300 MG TABS tablet    Sig: Take 1 tablet (300 mg total) by mouth daily before breakfast.    Dispense:  90 tablet    Refill:  1     Follow-up: Return in about 4 weeks (around 04/27/2018).  Scarlette Calico, MD

## 2018-04-17 ENCOUNTER — Telehealth: Payer: Self-pay

## 2018-04-17 NOTE — Telephone Encounter (Signed)
Copied from CRM [id number]. Topic: General - Inquiry >> Apr 17, 2018  9:26 AM Crist Infante wrote: Reason for CRM: wife is calling to ask about pt's meds.  (she is on DPR, and pt is at work) She states they were seen 10/10 and pt's meds were changed. Invokana increased and new med TRINTELLIX started.  She states every since then pt throws up every day in the morning. She states he is not eating. Pt is a diabetic and she knows this is not good.   Pt also stopped the depression med pt was put on as well. She states pt would not call the dr, so she is calling on his behalf.  Wife [name] is very concerned, and pt told her just leave it alone until his next appt on 11/07. But she doesn't feel like she should wait. She would like to know what she should do.  Wife is not sure what medicine is causing pt to throw up.  [name] goes to work at 1:30 pm and please call back before 1 pm today   Routing to dr Yetta Barre, patient has stopped trintelllix, but continues to feel nauseated/vomiting with the increased dosage of invokana (300mg ) --however, not able to eat either---can patient go back to previous dosage 100 mg tab invokana until he sees you on November 7th for follow up---or what recommendations do you have?  Please advise, I will call patient back, thanks

## 2018-04-17 NOTE — Telephone Encounter (Signed)
Called and left message for Rita/wife on her cell phone per Rita's request, [phone]---with dr Yetta Barre note/instructions---I will try calling again tomorrow to make sure she got this message

## 2018-04-17 NOTE — Telephone Encounter (Signed)
Yes, stay off trintellix Yes, go back to Invokana 100 mg QD

## 2018-04-19 ENCOUNTER — Other Ambulatory Visit: Payer: Self-pay | Admitting: Internal Medicine

## 2018-04-19 DIAGNOSIS — R112 Nausea with vomiting, unspecified: Secondary | ICD-10-CM

## 2018-04-19 MED ORDER — ONDANSETRON HCL 8 MG PO TABS: 8.0000 mg | ORAL_TABLET | Freq: Three times a day (TID) | ORAL | 1 refills | Status: DC | PRN

## 2018-04-19 NOTE — Telephone Encounter (Signed)
Confirmed with patient's wife, [name]---medication changes were made, patient is some better, but is still nauseated at times---routing to dr jones---are you ok with sending in zofran until patient sees you on next Thursday---please advise, wife will pick up at pharm if you are ok with sending in, thanks

## 2018-04-27 ENCOUNTER — Encounter: Payer: Self-pay | Admitting: Internal Medicine

## 2018-04-27 ENCOUNTER — Ambulatory Visit: Payer: 59 | Admitting: Internal Medicine

## 2018-04-27 VITALS — BP 112/68 | HR 74 | Temp 98.1°F | Resp 16 | Ht 66.0 in | Wt 196.0 lb

## 2018-04-27 DIAGNOSIS — I10 Essential (primary) hypertension: Secondary | ICD-10-CM | POA: Diagnosis not present

## 2018-04-27 DIAGNOSIS — E118 Type 2 diabetes mellitus with unspecified complications: Secondary | ICD-10-CM | POA: Diagnosis not present

## 2018-04-27 MED ORDER — CANAGLIFLOZIN 100 MG PO TABS: 100.0000 mg | ORAL_TABLET | Freq: Every day | ORAL | 1 refills | Status: DC

## 2018-04-27 MED ORDER — SITAGLIPTIN PHOS-METFORMIN HCL 50-1000 MG PO TABS: 1.0000 | ORAL_TABLET | Freq: Two times a day (BID) | ORAL | 1 refills | Status: DC

## 2018-04-27 NOTE — Patient Instructions (Signed)

## 2018-04-27 NOTE — Progress Notes (Signed)
Subjective:  Patient ID: [name], male    DOB: 1961/02/14  Age: 57 y.o. MRN: [id number]  CC: Hypertension and Diabetes   HPI [place] Clasby presents for f/up - He had side effects with the antidepressant and higher dose of Invokana so he has stopped taking antidepressant and iha decreased his Invokana dose down to 100.  He says the GI symptoms he was experiencing have resolved.  He is decided not to treat the depression.  He otherwise feels well today and offers no complaints.  Outpatient Medications Prior to Visit  Medication Sig Dispense Refill  . aspirin 81 MG tablet Take 81 mg by mouth daily.      . Blood Glucose Monitoring Suppl (ACCU-CHEK GUIDE) w/Device KIT 1 Act by Does not apply route 2 (two) times daily. 2 kit 0  . Cholecalciferol (D3-50) 50000 units capsule Take 1 capsule (50,000 Units total) by mouth once a week. 12 capsule 0  . clopidogrel (PLAVIX) 75 MG tablet TAKE 1 TABLET BY MOUTH EVERY DAY 90 tablet 3  . glucose blood (ACCU-CHEK GUIDE) test strip Use BID 100 each 11  . metoprolol tartrate (LOPRESSOR) 25 MG tablet TAKE 1 TABLET BY MOUTH TWICE A DAY 180 tablet 2  . olmesartan (BENICAR) 20 MG tablet Take 1 tablet (20 mg total) by mouth daily. 90 tablet 3  . omega-3 acid ethyl esters (LOVAZA) 1 g capsule Take 2 capsules (2 g total) by mouth 2 (two) times daily. 360 capsule 1  . rosuvastatin (CRESTOR) 40 MG tablet TAKE 1 TABLET BY MOUTH EVERY DAY IN THE EVENING 90 tablet 3  . ondansetron (ZOFRAN) 8 MG tablet Take 1 tablet (8 mg total) by mouth every 8 (eight) hours as needed for nausea or vomiting. 45 tablet 1  . sitaGLIPtin-metformin (JANUMET) 50-1000 MG tablet Take 1 tablet by mouth 2 (two) times daily with a meal. 180 tablet 1  . nitroGLYCERIN (NITROSTAT) 0.4 MG SL tablet Place 1 tablet (0.4 mg total) under the tongue every 5 (five) minutes as needed for chest pain. (Patient not taking: Reported on 04/27/2018) 25 tablet 3   No facility-administered medications prior to  visit.     ROS Review of Systems  Constitutional: Negative for fatigue and unexpected weight change.  HENT: Negative.   Eyes: Negative.   Respiratory: Negative.  Negative for cough and shortness of breath.   Cardiovascular: Negative for chest pain, palpitations and leg swelling.  Gastrointestinal: Negative for abdominal pain, constipation, diarrhea, nausea and vomiting.  Endocrine: Negative.   Genitourinary: Negative.  Negative for difficulty urinating.  Musculoskeletal: Negative.  Negative for arthralgias and myalgias.  Skin: Negative.   Neurological: Negative for dizziness, weakness, light-headedness and headaches.  Hematological: Negative for adenopathy. Does not bruise/bleed easily.  Psychiatric/Behavioral: Positive for dysphoric mood. Negative for self-injury, sleep disturbance and suicidal ideas. The patient is not nervous/anxious.     Objective:  BP 112/68 (BP Location: Left Arm, Patient Position: Sitting, Cuff Size: Normal)   Pulse 74   Temp 98.1 F (36.7 C) (Oral)   Resp 16   Ht 5' 6"  (1.676 m)   Wt 196 lb (88.9 kg)   SpO2 95%   BMI 31.64 kg/m   BP Readings from Last 3 Encounters:  04/27/18 112/68  03/30/18 132/72  11/28/17 124/80    Wt Readings from Last 3 Encounters:  04/27/18 196 lb (88.9 kg)  03/30/18 197 lb 8 oz (89.6 kg)  11/28/17 195 lb 4 oz (88.6 kg)    Physical Exam  Constitutional: He is oriented to person, place, and time. No distress.  HENT:  Mouth/Throat: Oropharynx is clear and moist. No oropharyngeal exudate.  Eyes: Conjunctivae are normal. No scleral icterus.  Neck: Normal range of motion. Neck supple. No JVD present. No thyromegaly present.  Cardiovascular: Normal rate, regular rhythm and normal heart sounds. Exam reveals no gallop.  No murmur heard. Pulmonary/Chest: Effort normal and breath sounds normal. No respiratory distress. He has no wheezes. He has no rales.  Abdominal: Soft. Bowel sounds are normal. He exhibits no mass. There is  no hepatosplenomegaly. There is no tenderness.  Musculoskeletal: Normal range of motion. He exhibits no edema, tenderness or deformity.  Lymphadenopathy:    He has no cervical adenopathy.  Neurological: He is alert and oriented to person, place, and time.  Skin: Skin is warm and dry. No rash noted. He is not diaphoretic.  Psychiatric: He has a normal mood and affect. His behavior is normal. Judgment and thought content normal.  Vitals reviewed.   Lab Results  Component Value Date   WBC 7.9 06/30/2017   HGB 14.1 06/30/2017   HCT 42.8 06/30/2017   PLT 231.0 06/30/2017   GLUCOSE 162 (H) 03/30/2018   CHOL 112 03/30/2018   TRIG 335.0 (H) 03/30/2018   HDL 32.80 (L) 03/30/2018   LDLDIRECT 45.0 03/30/2018   LDLCALC 35 08/21/2013   ALT 26 06/30/2017   AST 22 06/30/2017   NA 139 03/30/2018   K 3.8 03/30/2018   CL 101 03/30/2018   CREATININE 0.75 03/30/2018   BUN 13 03/30/2018   CO2 27 03/30/2018   TSH 1.75 06/30/2017   PSA 0.29 03/30/2018   INR 0.9 ratio 04/22/2009   HGBA1C 7.4 (H) 03/30/2018   MICROALBUR 14.7 (H) 06/30/2017    Dg Chest 2 View  Result Date: 08/08/2016 CLINICAL DATA:  Initial evaluation for acute right upper quadrant pain status post surgery. EXAM: CHEST  2 VIEW COMPARISON:  Prior radiograph from 07/10/2010. FINDINGS: The cardiac and mediastinal silhouettes are stable in size and contour, and remain within normal limits. Suspected underlying emphysema. No airspace consolidation, pleural effusion, or pulmonary edema is identified. There is no pneumothorax. No acute osseous abnormality identified. IMPRESSION: No active cardiopulmonary disease. Electronically Signed   By: Jeannine Boga M.D.   On: 08/08/2016 00:29   Ct Abdomen Pelvis W Contrast  Result Date: 08/07/2016 CLINICAL DATA:  Right upper quadrant abdominal pain radiating to the back for 4 days. History of laparoscopic cholecystectomy and umbilical hernia repair 1 week ago. EXAM: CT ABDOMEN AND PELVIS WITH  CONTRAST TECHNIQUE: Multidetector CT imaging of the abdomen and pelvis was performed using the standard protocol following bolus administration of intravenous contrast. CONTRAST:  173m ISOVUE-300 IOPAMIDOL (ISOVUE-300) INJECTION 61% COMPARISON:  07/12/2016 FINDINGS: Lower chest: Minimal dependent changes in the lung bases. Coronary artery calcifications. Hepatobiliary: Surgical absence of the gallbladder. No bile duct dilatation. No fluid collections or infiltrative changes demonstrated in the gallbladder fossa. Diffuse fatty infiltration of the liver. No focal liver lesions. Pancreas: Unremarkable. No pancreatic ductal dilatation or surrounding inflammatory changes. Spleen: Normal in size without focal abnormality. Adrenals/Urinary Tract: Adrenal glands are unremarkable. Kidneys are normal, without renal calculi, focal lesion, or hydronephrosis. Bladder is unremarkable. Stomach/Bowel: Stomach is within normal limits. Appendix appears normal. No evidence of bowel wall thickening, distention, or inflammatory changes. Vascular/Lymphatic: Aortic atherosclerosis. No enlarged abdominal or pelvic lymph nodes. Reproductive: Borderline prostate size at 3.8 cm diameter. Prostate calcifications are present. Other: There is focal infiltration in the anterior  abdominal wall at the level of the umbilicus, likely representing postoperative change. No loculated fluid collections. No free fluid or free air in the abdomen. Abdominal wall musculature appears intact. Musculoskeletal: Degenerative changes in the spine. No destructive bone lesions. IMPRESSION: Postoperative cholecystectomy. No fluid or infiltration in the gallbladder fossa. No free fluid in the abdomen. Focal infiltration in the anterior abdominal wall around the umbilical region is likely postoperative. No loculated collection to suggest abscess. Diffuse fatty infiltration of the liver. No evidence of bowel obstruction or inflammation. Electronically Signed   By:  Lucienne Capers M.D.   On: 08/07/2016 22:46    Assessment & Plan:   Son was seen today for hypertension and diabetes.  Diagnoses and all orders for this visit:  Type II diabetes mellitus with manifestations ([place])- His recent A1c was 7.4% but he did not tolerate the high dose of invokana.  Will continue the current doses of sitagliptin, metformin, and Invokana. -     sitaGLIPtin-metformin (JANUMET) 50-1000 MG tablet; Take 1 tablet by mouth 2 (two) times daily with a meal. -     canagliflozin (INVOKANA) 100 MG TABS tablet; Take 1 tablet (100 mg total) by mouth daily before breakfast.  Essential hypertension- His blood pressure is adequately well controlled.  Type 2 diabetes mellitus with complication, without long-term current use of insulin ([facility]) -     sitaGLIPtin-metformin (JANUMET) 50-1000 MG tablet; Take 1 tablet by mouth 2 (two) times daily with a meal.   I have discontinued Wilmar Carreto's ondansetron. I am also having him start on canagliflozin. Additionally, I am having him maintain his aspirin, nitroGLYCERIN, ACCU-CHEK GUIDE, glucose blood, clopidogrel, rosuvastatin, metoprolol tartrate, olmesartan, omega-3 acid ethyl esters, Cholecalciferol, and sitaGLIPtin-metformin.  Meds ordered this encounter  Medications  . sitaGLIPtin-metformin (JANUMET) 50-1000 MG tablet    Sig: Take 1 tablet by mouth 2 (two) times daily with a meal.    Dispense:  180 tablet    Refill:  1  . canagliflozin (INVOKANA) 100 MG TABS tablet    Sig: Take 1 tablet (100 mg total) by mouth daily before breakfast.    Dispense:  90 tablet    Refill:  1     Follow-up: Return in about 6 months (around 10/26/2018).  Scarlette Calico, MD

## 2018-07-12 NOTE — Telephone Encounter (Signed)
error 

## 2018-07-24 ENCOUNTER — Other Ambulatory Visit: Payer: Self-pay | Admitting: Internal Medicine

## 2018-07-24 DIAGNOSIS — E559 Vitamin D deficiency, unspecified: Secondary | ICD-10-CM

## 2018-09-05 LAB — HM DIABETES EYE EXAM

## 2018-10-08 ENCOUNTER — Other Ambulatory Visit: Payer: Self-pay | Admitting: Internal Medicine

## 2018-10-08 DIAGNOSIS — E781 Pure hyperglyceridemia: Secondary | ICD-10-CM

## 2018-10-10 ENCOUNTER — Other Ambulatory Visit: Payer: Self-pay | Admitting: Internal Medicine

## 2018-10-10 DIAGNOSIS — E559 Vitamin D deficiency, unspecified: Secondary | ICD-10-CM

## 2018-10-24 ENCOUNTER — Telehealth: Payer: Self-pay | Admitting: Emergency Medicine

## 2018-10-24 NOTE — Telephone Encounter (Signed)
Pt unable to do virtual. Is it okay for pt to come into office? Or would you like to reschedule?  Pt is symptom free.  

## 2018-10-24 NOTE — Telephone Encounter (Signed)
Pt aware to come in. 

## 2018-10-24 NOTE — Telephone Encounter (Signed)
Yes, come in  TJ 

## 2018-10-30 ENCOUNTER — Encounter: Payer: Self-pay | Admitting: Internal Medicine

## 2018-10-30 ENCOUNTER — Ambulatory Visit: Payer: 59 | Admitting: Internal Medicine

## 2018-10-30 ENCOUNTER — Other Ambulatory Visit: Payer: Self-pay

## 2018-10-30 ENCOUNTER — Other Ambulatory Visit (INDEPENDENT_AMBULATORY_CARE_PROVIDER_SITE_OTHER): Payer: 59

## 2018-10-30 VITALS — BP 132/80 | HR 64 | Temp 98.1°F | Resp 16 | Ht 66.0 in | Wt 199.0 lb

## 2018-10-30 DIAGNOSIS — E118 Type 2 diabetes mellitus with unspecified complications: Secondary | ICD-10-CM

## 2018-10-30 DIAGNOSIS — E781 Pure hyperglyceridemia: Secondary | ICD-10-CM | POA: Diagnosis not present

## 2018-10-30 DIAGNOSIS — E559 Vitamin D deficiency, unspecified: Secondary | ICD-10-CM

## 2018-10-30 DIAGNOSIS — I10 Essential (primary) hypertension: Secondary | ICD-10-CM

## 2018-10-30 DIAGNOSIS — R809 Proteinuria, unspecified: Secondary | ICD-10-CM

## 2018-10-30 DIAGNOSIS — E1129 Type 2 diabetes mellitus with other diabetic kidney complication: Secondary | ICD-10-CM

## 2018-10-30 LAB — BASIC METABOLIC PANEL
BUN: 17 mg/dL (ref 6–23)
CO2: 27 mEq/L (ref 19–32)
Calcium: 9.7 mg/dL (ref 8.4–10.5)
Chloride: 100 mEq/L (ref 96–112)
Creatinine, Ser: 0.71 mg/dL (ref 0.40–1.50)
GFR: 114.99 mL/min (ref >60.00)
Glucose, Bld: 189 mg/dL — ABNORMAL HIGH (ref 70–99)
Potassium: 3.8 mEq/L (ref 3.5–5.1)
Sodium: 137 mEq/L (ref 135–145)

## 2018-10-30 LAB — TRIGLYCERIDES: Triglycerides: 449 mg/dL — ABNORMAL HIGH (ref 0.0–149.0)

## 2018-10-30 LAB — HEMOGLOBIN A1C: Hgb A1c MFr Bld: 7.9 % — ABNORMAL HIGH (ref 4.6–6.5)

## 2018-10-30 LAB — VITAMIN D 25 HYDROXY (VIT D DEFICIENCY, FRACTURES): VITD: 44.73 ng/mL (ref 30.00–100.00)

## 2018-10-30 MED ORDER — OMEGA-3-ACID ETHYL ESTERS 1 G PO CAPS: 2.0000 | ORAL_CAPSULE | Freq: Two times a day (BID) | ORAL | 1 refills | Status: DC

## 2018-10-30 MED ORDER — CANAGLIFLOZIN 100 MG PO TABS: 100.0000 mg | ORAL_TABLET | Freq: Every day | ORAL | 1 refills | Status: DC

## 2018-10-30 MED ORDER — SITAGLIPTIN PHOS-METFORMIN HCL 50-1000 MG PO TABS: 1.0000 | ORAL_TABLET | Freq: Two times a day (BID) | ORAL | 1 refills | Status: DC

## 2018-10-30 NOTE — Patient Instructions (Signed)
Type 2 Diabetes Mellitus, Diagnosis, Adult Type 2 diabetes (type 2 diabetes mellitus) is a long-term (chronic) disease. In type 2 diabetes, one or both of these problems may be present:  The pancreas does not make enough of a hormone called insulin.  Cells in the body do not respond properly to insulin that the body makes (insulin resistance). Normally, insulin allows blood sugar (glucose) to enter cells in the body. The cells use glucose for energy. Insulin resistance or lack of insulin causes excess glucose to build up in the blood instead of going into cells. As a result, high blood glucose (hyperglycemia) develops. What increases the risk? The following factors may make you more likely to develop type 2 diabetes:  Having a family member with type 2 diabetes.  Being overweight or obese.  Having an inactive (sedentary) lifestyle.  Having been diagnosed with insulin resistance.  Having a history of prediabetes, gestational diabetes, or polycystic ovary syndrome (PCOS).  Being of American-Indian, African-American, Hispanic/Latino, or Asian/Pacific Islander descent. What are the signs or symptoms? In the early stage of this condition, you may not have symptoms. Symptoms develop slowly and may include:  Increased thirst (polydipsia).  Increased hunger(polyphagia).  Increased urination (polyuria).  Increased urination during the night (nocturia).  Unexplained weight loss.  Frequent infections that keep coming back (recurring).  Fatigue.  Weakness.  Vision changes, such as blurry vision.  Cuts or bruises that are slow to heal.  Tingling or numbness in the hands or feet.  Dark patches on the skin (acanthosis nigricans). How is this diagnosed? This condition is diagnosed based on your symptoms, your medical history, a physical exam, and your blood glucose level. Your blood glucose may be checked with one or more of the following blood tests:  A fasting blood glucose (FBG)  test. You will not be allowed to eat (you will fast) for 8 hours or longer before a blood sample is taken.  A random blood glucose test. This test checks blood glucose at any time of day regardless of when you ate.  An A1c (hemoglobin A1c) blood test. This test provides information about blood glucose control over the previous 2-3 months.  An oral glucose tolerance test (OGTT). This test measures your blood glucose at two times: ? After fasting. This is your baseline blood glucose level. ? Two hours after drinking a beverage that contains glucose. You may be diagnosed with type 2 diabetes if:  Your FBG level is 126 mg/dL (7.0 mmol/L) or higher.  Your random blood glucose level is 200 mg/dL (11.1 mmol/L) or higher.  Your A1c level is 6.5% or higher.  Your OGTT result is higher than 200 mg/dL (11.1 mmol/L). These blood tests may be repeated to confirm your diagnosis. How is this treated? Your treatment may be managed by a specialist called an endocrinologist. Type 2 diabetes may be treated by following instructions from your health care provider about:  Making diet and lifestyle changes. This may include: ? Following an individualized nutrition plan that is developed by a diet and nutrition specialist (registered dietitian). ? Exercising regularly. ? Finding ways to manage stress.  Checking your blood glucose level as often as told.  Taking diabetes medicines or insulin daily. This helps to keep your blood glucose levels in the healthy range. ? If you use insulin, you may need to adjust the dosage depending on how physically active you are and what foods you eat. Your health care provider will tell you how to adjust your dosage.    Taking medicines to help prevent complications from diabetes, such as: ? Aspirin. ? Medicine to lower cholesterol. ? Medicine to control blood pressure. Your health care provider will set individualized treatment goals for you. Your goals will be based on  your age, other medical conditions you have, and how you respond to diabetes treatment. Generally, the goal of treatment is to maintain the following blood glucose levels:  Before meals (preprandial): 80-130 mg/dL (4.4-7.2 mmol/L).  After meals (postprandial): below 180 mg/dL (10 mmol/L).  A1c level: less than 7%. Follow these instructions at home: Questions to ask your health care provider  Consider asking the following questions: ? Do I need to meet with a diabetes educator? ? Where can I find a support group for people with diabetes? ? What equipment will I need to manage my diabetes at home? ? What diabetes medicines do I need, and when should I take them? ? How often do I need to check my blood glucose? ? What number can I call if I have questions? ? When is my next appointment? General instructions  Take over-the-counter and prescription medicines only as told by your health care provider.  Keep all follow-up visits as told by your health care provider. This is important.  For more information about diabetes, visit: ? American Diabetes Association (ADA): www.diabetes.org ? American Association of Diabetes Educators (AADE): www.diabeteseducator.org Contact a health care provider if:  Your blood glucose is at or above 240 mg/dL (13.3 mmol/L) for 2 days in a row.  You have been sick or have had a fever for 2 days or longer, and you are not getting better.  You have any of the following problems for more than 6 hours: ? You cannot eat or drink. ? You have nausea and vomiting. ? You have diarrhea. Get help right away if:  Your blood glucose is lower than 54 mg/dL (3.0 mmol/L).  You become confused or you have trouble thinking clearly.  You have difficulty breathing.  You have moderate or large ketone levels in your urine. Summary  Type 2 diabetes (type 2 diabetes mellitus) is a long-term (chronic) disease. In type 2 diabetes, the pancreas does not make enough of a  hormone called insulin, or cells in the body do not respond properly to insulin that the body makes (insulin resistance).  This condition is treated by making diet and lifestyle changes and taking diabetes medicines or insulin.  Your health care provider will set individualized treatment goals for you. Your goals will be based on your age, other medical conditions you have, and how you respond to diabetes treatment.  Keep all follow-up visits as told by your health care provider. This is important. This information is not intended to replace advice given to you by your health care provider. Make sure you discuss any questions you have with your health care provider. Document Released: 06/07/2005 Document Revised: 01/06/2017 Document Reviewed: 07/11/2015 [employer]vier Interactive Patient Education  2019 [employer]  

## 2018-10-30 NOTE — Progress Notes (Signed)
Subjective:  Patient ID: [name], male    DOB: 02-20-1961  Age: 58 y.o. MRN: [id number]  CC: Hypertension; Hyperlipidemia; and Diabetes   HPI [name] presents for f/up - He tells me that he has felt well recently and offers no complaints today.  He does not monitor his blood pressure or his blood sugar.  He would not commit to whether or not he had been compliant with his listed medications.  He denies any recent episodes of CP or DOE.  Outpatient Medications Prior to Visit  Medication Sig Dispense Refill  . aspirin 81 MG tablet Take 81 mg by mouth daily.      . Blood Glucose Monitoring Suppl (ACCU-CHEK GUIDE) w/Device KIT 1 Act by Does not apply route 2 (two) times daily. 2 kit 0  . clopidogrel (PLAVIX) 75 MG tablet TAKE 1 TABLET BY MOUTH EVERY DAY 90 tablet 3  . D3-50 1.25 MG (50000 UT) capsule TAKE 1 CAPSULE BY MOUTH ONE TIME PER WEEK 12 capsule 0  . glucose blood (ACCU-CHEK GUIDE) test strip Use BID 100 each 11  . metoprolol tartrate (LOPRESSOR) 25 MG tablet TAKE 1 TABLET BY MOUTH TWICE A DAY 180 tablet 2  . nitroGLYCERIN (NITROSTAT) 0.4 MG SL tablet Place 1 tablet (0.4 mg total) under the tongue every 5 (five) minutes as needed for chest pain. 25 tablet 3  . olmesartan (BENICAR) 20 MG tablet Take 1 tablet (20 mg total) by mouth daily. 90 tablet 3  . rosuvastatin (CRESTOR) 40 MG tablet TAKE 1 TABLET BY MOUTH EVERY DAY IN THE EVENING 90 tablet 3  . canagliflozin (INVOKANA) 100 MG TABS tablet Take 1 tablet (100 mg total) by mouth daily before breakfast. 90 tablet 1  . omega-3 acid ethyl esters (LOVAZA) 1 g capsule TAKE 2 CAPSULES (2 G TOTAL) BY MOUTH 2 (TWO) TIMES DAILY. 360 capsule 1  . sitaGLIPtin-metformin (JANUMET) 50-1000 MG tablet Take 1 tablet by mouth 2 (two) times daily with a meal. 180 tablet 1   No facility-administered medications prior to visit.     ROS Review of Systems  Constitutional: Negative.  Negative for diaphoresis and unexpected weight change.   HENT: Negative.   Eyes: Negative for visual disturbance.  Respiratory: Negative for cough, chest tightness, shortness of breath and wheezing.   Cardiovascular: Negative for palpitations and leg swelling.  Gastrointestinal: Negative for abdominal pain, constipation, diarrhea, nausea and vomiting.  Endocrine: Negative for polydipsia, polyphagia and polyuria.  Genitourinary: Negative.  Negative for decreased urine volume, difficulty urinating and urgency.  Musculoskeletal: Negative.  Negative for arthralgias and myalgias.  Skin: Negative.  Negative for color change.  Neurological: Negative.  Negative for dizziness, weakness, light-headedness and numbness.  Hematological: Negative for adenopathy. Does not bruise/bleed easily.  Psychiatric/Behavioral: Negative.     Objective:  BP 132/80 (BP Location: Left Arm, Patient Position: Sitting, Cuff Size: Normal)   Pulse 64   Temp 98.1 F (36.7 C) (Oral)   Resp 16   Ht 5' 6"  (1.676 m)   Wt 199 lb (90.3 kg)   SpO2 97%   BMI 32.12 kg/m   BP Readings from Last 3 Encounters:  10/30/18 132/80  04/27/18 112/68  03/30/18 132/72    Wt Readings from Last 3 Encounters:  10/30/18 199 lb (90.3 kg)  04/27/18 196 lb (88.9 kg)  03/30/18 197 lb 8 oz (89.6 kg)    Physical Exam Vitals signs reviewed.  Constitutional:      Appearance: He is obese. He is not  ill-appearing or diaphoretic.  HENT:     Nose: Nose normal. No congestion or rhinorrhea.     Mouth/Throat:     Mouth: Mucous membranes are moist.  Eyes:     General: No scleral icterus.    Conjunctiva/sclera: Conjunctivae normal.  Neck:     Musculoskeletal: Normal range of motion and neck supple. No muscular tenderness.  Cardiovascular:     Rate and Rhythm: Normal rate and regular rhythm.     Heart sounds: No murmur. No gallop.   Pulmonary:     Effort: Pulmonary effort is normal. No respiratory distress.     Breath sounds: No stridor. No wheezing, rhonchi or rales.  Abdominal:      General: Abdomen is flat.     Palpations: There is no hepatomegaly, splenomegaly or mass.     Tenderness: There is no abdominal tenderness. There is no guarding.  Musculoskeletal: Normal range of motion.        General: No swelling.     Right lower leg: No edema.     Left lower leg: No edema.  Lymphadenopathy:     Cervical: No cervical adenopathy.  Skin:    General: Skin is warm and dry.  Neurological:     General: No focal deficit present.  Psychiatric:        Mood and Affect: Mood normal.     Lab Results  Component Value Date   WBC 7.9 06/30/2017   HGB 14.1 06/30/2017   HCT 42.8 06/30/2017   PLT 231.0 06/30/2017   GLUCOSE 189 (H) 10/30/2018   CHOL 112 03/30/2018   TRIG (H) 10/30/2018    449.0 Triglyceride is over 400; calculations on Lipids are invalid.   HDL 32.80 (L) 03/30/2018   LDLDIRECT 45.0 03/30/2018   LDLCALC 35 08/21/2013   ALT 26 06/30/2017   AST 22 06/30/2017   NA 137 10/30/2018   K 3.8 10/30/2018   CL 100 10/30/2018   CREATININE 0.71 10/30/2018   BUN 17 10/30/2018   CO2 27 10/30/2018   TSH 1.75 06/30/2017   PSA 0.29 03/30/2018   INR 0.9 ratio 04/22/2009   HGBA1C 7.9 (H) 10/30/2018   MICROALBUR 14.7 (H) 06/30/2017    Dg Chest 2 View  Result Date: 08/08/2016 CLINICAL DATA:  Initial evaluation for acute right upper quadrant pain status post surgery. EXAM: CHEST  2 VIEW COMPARISON:  Prior radiograph from 07/10/2010. FINDINGS: The cardiac and mediastinal silhouettes are stable in size and contour, and remain within normal limits. Suspected underlying emphysema. No airspace consolidation, pleural effusion, or pulmonary edema is identified. There is no pneumothorax. No acute osseous abnormality identified. IMPRESSION: No active cardiopulmonary disease. Electronically Signed   By: Jeannine Boga M.D.   On: 08/08/2016 00:29   Ct Abdomen Pelvis W Contrast  Result Date: 08/07/2016 CLINICAL DATA:  Right upper quadrant abdominal pain radiating to the back  for 4 days. History of laparoscopic cholecystectomy and umbilical hernia repair 1 week ago. EXAM: CT ABDOMEN AND PELVIS WITH CONTRAST TECHNIQUE: Multidetector CT imaging of the abdomen and pelvis was performed using the standard protocol following bolus administration of intravenous contrast. CONTRAST:  151m ISOVUE-300 IOPAMIDOL (ISOVUE-300) INJECTION 61% COMPARISON:  07/12/2016 FINDINGS: Lower chest: Minimal dependent changes in the lung bases. Coronary artery calcifications. Hepatobiliary: Surgical absence of the gallbladder. No bile duct dilatation. No fluid collections or infiltrative changes demonstrated in the gallbladder fossa. Diffuse fatty infiltration of the liver. No focal liver lesions. Pancreas: Unremarkable. No pancreatic ductal dilatation or surrounding inflammatory changes.  Spleen: Normal in size without focal abnormality. Adrenals/Urinary Tract: Adrenal glands are unremarkable. Kidneys are normal, without renal calculi, focal lesion, or hydronephrosis. Bladder is unremarkable. Stomach/Bowel: Stomach is within normal limits. Appendix appears normal. No evidence of bowel wall thickening, distention, or inflammatory changes. Vascular/Lymphatic: Aortic atherosclerosis. No enlarged abdominal or pelvic lymph nodes. Reproductive: Borderline prostate size at 3.8 cm diameter. Prostate calcifications are present. Other: There is focal infiltration in the anterior abdominal wall at the level of the umbilicus, likely representing postoperative change. No loculated fluid collections. No free fluid or free air in the abdomen. Abdominal wall musculature appears intact. Musculoskeletal: Degenerative changes in the spine. No destructive bone lesions. IMPRESSION: Postoperative cholecystectomy. No fluid or infiltration in the gallbladder fossa. No free fluid in the abdomen. Focal infiltration in the anterior abdominal wall around the umbilical region is likely postoperative. No loculated collection to suggest  abscess. Diffuse fatty infiltration of the liver. No evidence of bowel obstruction or inflammation. Electronically Signed   By: Lucienne Capers M.D.   On: 08/07/2016 22:46    Assessment & Plan:   Telly was seen today for hypertension, hyperlipidemia and diabetes.  Diagnoses and all orders for this visit:  Essential hypertension- His blood pressure is adequately well controlled.  Electrolytes and renal function are normal. -     Basic metabolic panel; Future -     Urinalysis, Routine w reflex microscopic; Future  Type II diabetes mellitus with manifestations ([place])- His A1c is up to 7.9%.  His blood sugars are not adequately well controlled.  I am not certain that he is compliant with his medications so I have asked him to restart all 3 glycemic agents.  I have also asked him to improve his lifestyle modifications. -     Basic metabolic panel; Future -     Hemoglobin A1c; Future -     Microalbumin / creatinine urine ratio; Future -     sitaGLIPtin-metformin (JANUMET) 50-1000 MG tablet; Take 1 tablet by mouth 2 (two) times daily with a meal. -     canagliflozin (INVOKANA) 100 MG TABS tablet; Take 1 tablet (100 mg total) by mouth daily before breakfast.  Pure hyperglyceridemia- His triglycerides are too high.  I have asked him to restart the omega-3 fish oil supplement as well as improving his lifestyle modifications. -     Triglycerides; Future -     omega-3 acid ethyl esters (LOVAZA) 1 g capsule; Take 2 capsules (2 g total) by mouth 2 (two) times daily.  Vitamin D deficiency- His vitamin D level is in the normal range now. -     VITAMIN D 25 Hydroxy (Vit-D Deficiency, Fractures); Future  Microalbuminuria due to type 2 diabetes mellitus (HCC) -     canagliflozin (INVOKANA) 100 MG TABS tablet; Take 1 tablet (100 mg total) by mouth daily before breakfast.  Type 2 diabetes mellitus with complication, without long-term current use of insulin (HCC) -     sitaGLIPtin-metformin (JANUMET)  50-1000 MG tablet; Take 1 tablet by mouth 2 (two) times daily with a meal.   I am having Fredricka Bonine maintain his aspirin, nitroGLYCERIN, Accu-Chek Guide, glucose blood, clopidogrel, rosuvastatin, metoprolol tartrate, olmesartan, D3-50, sitaGLIPtin-metformin, canagliflozin, and omega-3 acid ethyl esters.  Meds ordered this encounter  Medications  . sitaGLIPtin-metformin (JANUMET) 50-1000 MG tablet    Sig: Take 1 tablet by mouth 2 (two) times daily with a meal.    Dispense:  180 tablet    Refill:  1  . canagliflozin (INVOKANA)  100 MG TABS tablet    Sig: Take 1 tablet (100 mg total) by mouth daily before breakfast.    Dispense:  90 tablet    Refill:  1  . omega-3 acid ethyl esters (LOVAZA) 1 g capsule    Sig: Take 2 capsules (2 g total) by mouth 2 (two) times daily.    Dispense:  360 capsule    Refill:  1     Follow-up: Return in about 6 months (around 05/02/2019).  Scarlette Calico, MD

## 2018-11-01 ENCOUNTER — Telehealth: Payer: Self-pay | Admitting: Internal Medicine

## 2018-11-01 NOTE — Telephone Encounter (Signed)
I have given MD response to patients recent labs to spouse.   Spouse states that patient does miss some of his medication doses.   Please follow up with in regard.

## 2018-11-16 ENCOUNTER — Other Ambulatory Visit: Payer: Self-pay | Admitting: Cardiovascular Disease

## 2018-11-16 DIAGNOSIS — E781 Pure hyperglyceridemia: Secondary | ICD-10-CM

## 2018-11-16 DIAGNOSIS — I10 Essential (primary) hypertension: Secondary | ICD-10-CM

## 2018-11-20 ENCOUNTER — Other Ambulatory Visit: Payer: Self-pay | Admitting: Cardiovascular Disease

## 2018-11-20 DIAGNOSIS — I2510 Atherosclerotic heart disease of native coronary artery without angina pectoris: Secondary | ICD-10-CM

## 2018-11-23 ENCOUNTER — Telehealth: Payer: Self-pay | Admitting: *Deleted

## 2018-11-23 NOTE — Telephone Encounter (Signed)
Spoke with pt about appointment and patient said he can wait a year. He wants to come in when are back seeing patients

## 2018-11-28 ENCOUNTER — Ambulatory Visit: Payer: 59 | Admitting: Physician Assistant

## 2019-01-13 ENCOUNTER — Other Ambulatory Visit: Payer: Self-pay | Admitting: Cardiovascular Disease

## 2019-01-15 ENCOUNTER — Other Ambulatory Visit: Payer: Self-pay | Admitting: Cardiovascular Disease

## 2019-01-15 ENCOUNTER — Other Ambulatory Visit: Payer: Self-pay | Admitting: Internal Medicine

## 2019-01-15 DIAGNOSIS — I2510 Atherosclerotic heart disease of native coronary artery without angina pectoris: Secondary | ICD-10-CM

## 2019-01-15 DIAGNOSIS — E559 Vitamin D deficiency, unspecified: Secondary | ICD-10-CM

## 2019-02-05 ENCOUNTER — Other Ambulatory Visit: Payer: Self-pay | Admitting: Internal Medicine

## 2019-02-05 DIAGNOSIS — E559 Vitamin D deficiency, unspecified: Secondary | ICD-10-CM

## 2019-03-07 ENCOUNTER — Other Ambulatory Visit: Payer: Self-pay | Admitting: Internal Medicine

## 2019-03-07 DIAGNOSIS — E559 Vitamin D deficiency, unspecified: Secondary | ICD-10-CM

## 2019-04-08 ENCOUNTER — Other Ambulatory Visit: Payer: Self-pay | Admitting: Cardiovascular Disease

## 2019-04-11 ENCOUNTER — Other Ambulatory Visit: Payer: Self-pay | Admitting: Internal Medicine

## 2019-04-11 DIAGNOSIS — E118 Type 2 diabetes mellitus with unspecified complications: Secondary | ICD-10-CM

## 2019-04-24 NOTE — Progress Notes (Signed)
Cardiology Office Note    Date:  04/25/2019   ID:  [name], DOB 1961-04-10, MRN [id number]  PCP:  Janith Lima, MD  Cardiologist: Dr. Burt Knack  Chief Complaint: 15 Months follow up  History of Present Illness:   [name] is a 58 y.o. male CAD s/p multiple stents. HTN, HLD and DM seen for follow up.  Last heart catheterization in 2008 demonstrated normal LV systolic function, continued patency of the stented segments in the RCA and distal left circumflex, and nonobstructive disease elsewhere. He has been managed medically since that time.  He was last seen by Dr. Burt Knack 11/2017. He is been maintained on long-term dual antiplatelet therapy with multiple stents (BMS RCA, BMS LCx, and DES in RCA inside of BMS).   Seen today for follow up.  No complaints.  He does maintenance work for apartment complex.  Also exercise 3 times per week.  No exertional limitation.  However he eats high in carbohydrate and cholesterol.  Denies chest pain, shortness of breath, palpitation, dizziness, orthopnea, PND or syncope.  Past Medical History:  Diagnosis Date  . Asthma   . Coronary artery disease    s/p BMS to RCA and CFX. s/p DES to RCA 2/2 ISR 2010  . Diabetes mellitus without complication ([place])   . History of kidney stones    20 yrs ago   . Hyperlipidemia   . Hypertension   . Old myocardial infarction   . Paroxysmal ventricular tachycardia ([place])   . Sleep apnea    not using cpap    5-6 yrs     Past Surgical History:  Procedure Laterality Date  . CARDIAC CATHETERIZATION    . CHOLECYSTECTOMY N/A 07/28/2016   Procedure: LAPAROSCOPIC CHOLECYSTECTOMY;  Surgeon: Coralie Keens, MD;  Location: [place];  Service: General;  Laterality: N/A;  . CORONARY STENT PLACEMENT  2002, 2007, 2010   right  . UMBILICAL HERNIA REPAIR N/A 07/28/2016   Procedure: HERNIA REPAIR UMBILICAL ADULT;  Surgeon: Coralie Keens, MD;  Location: [place];  Service: General;  Laterality: N/A;    Current  Medications: Prior to Admission medications   Medication Sig Start Date End Date Taking? Authorizing Provider  aspirin 81 MG tablet Take 81 mg by mouth daily.      [provider]  Blood Glucose Monitoring Suppl (ACCU-CHEK GUIDE) w/Device KIT 1 Act by Does not apply route 2 (two) times daily. 02/28/17   Janith Lima, MD  canagliflozin [facility]) 100 MG TABS tablet Take 1 tablet (100 mg total) by mouth daily before breakfast. 10/30/18   Janith Lima, MD  Cholecalciferol (VITAMIN D3) 1.25 MG (50000 UT) CAPS TAKE 1 CAPSULE BY MOUTH ONE TIME PER WEEK 03/07/19   Janith Lima, MD  clopidogrel (PLAVIX) 75 MG tablet TAKE 1 TABLET BY MOUTH EVERY DAY 01/16/19   Sherren Mocha, MD  glucose blood (ACCU-CHEK GUIDE) test strip Use BID 02/28/17   Janith Lima, MD  metoprolol tartrate (LOPRESSOR) 25 MG tablet TAKE 1 TABLET BY MOUTH TWICE A DAY 11/16/18   Sherren Mocha, MD  nitroGLYCERIN (NITROSTAT) 0.4 MG SL tablet Place 1 tablet (0.4 mg total) under the tongue every 5 (five) minutes as needed for chest pain. 12/03/16   Sherren Mocha, MD  olmesartan (BENICAR) 20 MG tablet Take 1 tablet (20 mg total) by mouth daily. 04/11/19   Janith Lima, MD  omega-3 acid ethyl esters (LOVAZA) 1 g capsule Take 2 capsules (2 g total) by mouth 2 (two)  times daily. 10/30/18   Janith Lima, MD  rosuvastatin (CRESTOR) 40 MG tablet Take 1 tablet (40 mg total) by mouth daily. Please keep upcoming appt in November for future refills. Thank you 04/10/19   Sherren Mocha, MD  sitaGLIPtin-metformin [facility]) 50-1000 MG tablet Take 1 tablet by mouth 2 (two) times daily with a meal. 10/30/18   Janith Lima, MD    Allergies:   Heparin   Social History   Socioeconomic History  . Marital status: Married    Spouse name: Not on file  . Number of children: Not on file  . Years of education: Not on file  . Highest education level: Not on file  Occupational History  . Occupation: Banker: Production manager  Social Needs  . Financial resource strain: Not on file  . Food insecurity    Worry: Not on file    Inability: Not on file  . Transportation needs    Medical: Not on file    Non-medical: Not on file  Tobacco Use  . Smoking status: Former Smoker    Packs/day: 1.50    Years: 30.00    Pack years: 45.00    Types: Cigarettes    Quit date: 08/22/2011    Years since quitting: 7.6  . Smokeless tobacco: Never Used  . Tobacco comment: currently smoking 1 ppd.   Substance and Sexual Activity  . Alcohol use: No  . Drug use: No  . Sexual activity: Yes    Birth control/protection: None  Lifestyle  . Physical activity    Days per week: Not on file    Minutes per session: Not on file  . Stress: Not on file  Relationships  . Social Herbalist on phone: Not on file    Gets together: Not on file    Attends religious service: Not on file    Active member of club or organization: Not on file    Attends meetings of clubs or organizations: Not on file    Relationship status: Not on file  Other Topics Concern  . Not on file  Social History Narrative  . Not on file     Family History:  The patient's family history includes Alzheimer's disease in his mother; Diabetes in his mother and sister; Heart disease in his brother, mother, and sister; Kidney disease in his sister; Peripheral vascular disease in his sister.  ROS:   Please see the history of present illness.    ROS All other systems reviewed and are negative.   PHYSICAL EXAM:   VS:  BP 126/82   Pulse 80   Ht 5' 5"  (1.651 m)   Wt 196 lb 6.4 oz (89.1 kg)   SpO2 95%   BMI 32.68 kg/m    GEN: Well nourished, well developed, in no acute distress  HEENT: normal  Neck: no JVD, carotid bruits, or masses Cardiac: RRR; no murmurs, rubs, or gallops,no edema  Respiratory:  clear to auscultation bilaterally, normal work of breathing GI: soft, nontender, nondistended, + BS MS: no deformity or atrophy  Skin: warm and dry, no  rash Neuro:  Alert and Oriented x 3, Strength and sensation are intact Psych: euthymic mood, full affect  Wt Readings from Last 3 Encounters:  04/25/19 196 lb 6.4 oz (89.1 kg)  10/30/18 199 lb (90.3 kg)  04/27/18 196 lb (88.9 kg)      Studies/Labs Reviewed:   EKG:  EKG is ordered today.  The  ekg ordered today demonstrates normal sinus rhythm at rate of 71 bpm  Recent Labs: 10/30/2018: BUN 17; Creatinine, Ser 0.71; Potassium 3.8; Sodium 137   Lipid Panel    Component Value Date/Time   CHOL 112 03/30/2018 0906   CHOL 219 08/20/2012   TRIG (H) 10/30/2018 0833    449.0 Triglyceride is over 400; calculations on Lipids are invalid.   TRIG 402 08/20/2012   HDL 32.80 (L) 03/30/2018 0906   CHOLHDL 3 03/30/2018 0906   VLDL 67.0 (H) 03/30/2018 0906   LDLCALC 35 08/21/2013 0850   LDLCALC 107 08/20/2012   LDLDIRECT 45.0 03/30/2018 0906    Additional studies/ records that were reviewed today include:   As above    ASSESSMENT & PLAN:    1. CAD No angina.  Continue dual antiplatelet therapy with aspirin and Plavix.  Continue Lopressor, Lovaza and Crestor.  2. HTN -Blood pressure stable and well-controlled on current medication.  3. HLD -We will repeat labs.  Advised low-cholesterol diet.   Medication Adjustments/Labs and Tests Ordered: Current medicines are reviewed at length with the patient today.  Concerns regarding medicines are outlined above.  Medication changes, Labs and Tests ordered today are listed in the Patient Instructions below. Patient Instructions  Medication Instructions:   Your physician recommends that you continue on your current medications as directed. Please refer to the Current Medication list given to you today.  *If you need a refill on your cardiac medications before your next appointment, please call your pharmacy*  Lab Work:  Your physician recommends that you return for lab work on   If you have labs (blood work) drawn today and your  tests are completely normal, you will receive your results only by: Marland Kitchen MyChart Message (if you have MyChart) OR . A paper copy in the mail If you have any lab test that is abnormal or we need to change your treatment, we will call you to review the results.  Testing/Procedures:  None ordered today  Follow-Up: At [facility], you and your health needs are our priority.  As part of our continuing mission to provide you with exceptional heart care, we have created designated Provider Care Teams.  These Care Teams include your primary Cardiologist (physician) and Advanced Practice Providers (APPs -  Physician Assistants and Nurse Practitioners) who all work together to provide you with the care you need, when you need it.  Your next appointment:   12 months  The format for your next appointment:   Either In Person or Virtual  Provider:   You may see Sherren Mocha, MD or one of the following Advanced Practice Providers on your designated Care Team:    Richardson Dopp, PA-C  [place], PA-C  Daune Perch, NP        [place], [place]  04/25/2019 3:39 PM    [address], [place], [place]  [address] Phone: [phone]; Fax: [phone]

## 2019-04-25 ENCOUNTER — Other Ambulatory Visit: Payer: Self-pay

## 2019-04-25 ENCOUNTER — Encounter: Payer: Self-pay | Admitting: Physician Assistant

## 2019-04-25 ENCOUNTER — Other Ambulatory Visit: Payer: Self-pay | Admitting: Internal Medicine

## 2019-04-25 ENCOUNTER — Ambulatory Visit: Payer: 59 | Admitting: Physician Assistant

## 2019-04-25 VITALS — BP 126/82 | HR 80 | Ht 65.0 in | Wt 196.4 lb

## 2019-04-25 DIAGNOSIS — E559 Vitamin D deficiency, unspecified: Secondary | ICD-10-CM

## 2019-04-25 DIAGNOSIS — E782 Mixed hyperlipidemia: Secondary | ICD-10-CM | POA: Diagnosis not present

## 2019-04-25 DIAGNOSIS — I2510 Atherosclerotic heart disease of native coronary artery without angina pectoris: Secondary | ICD-10-CM | POA: Diagnosis not present

## 2019-04-25 DIAGNOSIS — I10 Essential (primary) hypertension: Secondary | ICD-10-CM | POA: Diagnosis not present

## 2019-04-25 NOTE — Patient Instructions (Addendum)
Medication Instructions:   Your physician recommends that you continue on your current medications as directed. Please refer to the Current Medication list given to you today.  *If you need a refill on your cardiac medications before your next appointment, please call your pharmacy*  Lab Work:  Your physician recommends that you return for lab work on 05/03/19 at 8:45AM, make sure you are fasting.  If you have labs (blood work) drawn today and your tests are completely normal, you will receive your results only by: Marland Kitchen MyChart Message (if you have MyChart) OR . A paper copy in the mail If you have any lab test that is abnormal or we need to change your treatment, we will call you to review the results.  Testing/Procedures:  None ordered today  Follow-Up: At [facility], you and your health needs are our priority.  As part of our continuing mission to provide you with exceptional heart care, we have created designated Provider Care Teams.  These Care Teams include your primary Cardiologist (physician) and Advanced Practice Providers (APPs -  Physician Assistants and Nurse Practitioners) who all work together to provide you with the care you need, when you need it.  Your next appointment:   12 months  The format for your next appointment:   Either In Person or Virtual  Provider:   You may see Sherren Mocha, MD or one of the following Advanced Practice Providers on your designated Care Team:    Richardson Dopp, PA-C  [place], [place]  Daune Perch, [place]

## 2019-05-02 ENCOUNTER — Encounter: Payer: Self-pay | Admitting: Internal Medicine

## 2019-05-02 ENCOUNTER — Ambulatory Visit (INDEPENDENT_AMBULATORY_CARE_PROVIDER_SITE_OTHER): Payer: 59 | Admitting: Internal Medicine

## 2019-05-02 ENCOUNTER — Other Ambulatory Visit (INDEPENDENT_AMBULATORY_CARE_PROVIDER_SITE_OTHER): Payer: 59

## 2019-05-02 ENCOUNTER — Other Ambulatory Visit: Payer: Self-pay

## 2019-05-02 VITALS — BP 126/72 | HR 72 | Temp 98.2°F | Ht 65.0 in | Wt 195.0 lb

## 2019-05-02 DIAGNOSIS — E559 Vitamin D deficiency, unspecified: Secondary | ICD-10-CM

## 2019-05-02 DIAGNOSIS — R809 Proteinuria, unspecified: Secondary | ICD-10-CM

## 2019-05-02 DIAGNOSIS — E118 Type 2 diabetes mellitus with unspecified complications: Secondary | ICD-10-CM | POA: Diagnosis not present

## 2019-05-02 DIAGNOSIS — Z0000 Encounter for general adult medical examination without abnormal findings: Secondary | ICD-10-CM

## 2019-05-02 DIAGNOSIS — I10 Essential (primary) hypertension: Secondary | ICD-10-CM | POA: Diagnosis not present

## 2019-05-02 DIAGNOSIS — E1129 Type 2 diabetes mellitus with other diabetic kidney complication: Secondary | ICD-10-CM

## 2019-05-02 DIAGNOSIS — E781 Pure hyperglyceridemia: Secondary | ICD-10-CM

## 2019-05-02 DIAGNOSIS — Z125 Encounter for screening for malignant neoplasm of prostate: Secondary | ICD-10-CM

## 2019-05-02 LAB — BASIC METABOLIC PANEL
BUN: 17 mg/dL (ref 6–23)
CO2: 26 mEq/L (ref 19–32)
Calcium: 9.6 mg/dL (ref 8.4–10.5)
Chloride: 99 mEq/L (ref 96–112)
Creatinine, Ser: 0.83 mg/dL (ref 0.40–1.50)
GFR: 95.85 mL/min (ref >60.00)
Glucose, Bld: 203 mg/dL — ABNORMAL HIGH (ref 70–99)
Potassium: 3.5 mEq/L (ref 3.5–5.1)
Sodium: 135 mEq/L (ref 135–145)

## 2019-05-02 LAB — CBC WITH DIFFERENTIAL/PLATELET
Basophils Absolute: 0.1 10*3/uL (ref 0.0–0.1)
Basophils Relative: 0.9 % (ref 0.0–3.0)
Eosinophils Absolute: 0.2 10*3/uL (ref 0.0–0.7)
Eosinophils Relative: 2.5 % (ref 0.0–5.0)
HCT: 43 % (ref 39.0–52.0)
Hemoglobin: 14.2 g/dL (ref 13.0–17.0)
Lymphocytes Relative: 36.3 % (ref 12.0–46.0)
Lymphs Abs: 2.6 10*3/uL (ref 0.7–4.0)
MCHC: 33 g/dL (ref 30.0–36.0)
MCV: 79.3 fl (ref 78.0–100.0)
Monocytes Absolute: 0.5 10*3/uL (ref 0.1–1.0)
Monocytes Relative: 7.1 % (ref 3.0–12.0)
Neutro Abs: 3.8 10*3/uL (ref 1.4–7.7)
Neutrophils Relative %: 53.2 % (ref 43.0–77.0)
Platelets: 227 10*3/uL (ref 150.0–400.0)
RBC: 5.42 Mil/uL (ref 4.22–5.81)
RDW: 14.7 % (ref 11.5–15.5)
WBC: 7.2 10*3/uL (ref 4.0–10.5)

## 2019-05-02 LAB — MICROALBUMIN / CREATININE URINE RATIO
Creatinine,U: 51.3 mg/dL
Microalb Creat Ratio: 4.2 mg/g (ref 0.0–30.0)
Microalb, Ur: 2.2 mg/dL — ABNORMAL HIGH (ref 0.0–1.9)

## 2019-05-02 LAB — HEPATIC FUNCTION PANEL
ALT: 23 U/L (ref 0–53)
AST: 20 U/L (ref 0–37)
Albumin: 4.9 g/dL (ref 3.5–5.2)
Alkaline Phosphatase: 48 U/L (ref 39–117)
Bilirubin, Direct: 0.1 mg/dL (ref 0.0–0.3)
Total Bilirubin: 0.6 mg/dL (ref 0.2–1.2)
Total Protein: 7.5 g/dL (ref 6.0–8.3)

## 2019-05-02 LAB — LIPID PANEL
Cholesterol: 118 mg/dL (ref 0–200)
HDL: 33.5 mg/dL — ABNORMAL LOW (ref >39.00)
Total CHOL/HDL Ratio: 4
Triglycerides: 481 mg/dL — ABNORMAL HIGH (ref 0.0–149.0)

## 2019-05-02 LAB — VITAMIN D 25 HYDROXY (VIT D DEFICIENCY, FRACTURES): VITD: 44.03 ng/mL (ref 30.00–100.00)

## 2019-05-02 LAB — TSH: TSH: 2.18 u[IU]/mL (ref 0.35–4.50)

## 2019-05-02 LAB — LDL CHOLESTEROL, DIRECT: Direct LDL: 44 mg/dL

## 2019-05-02 LAB — HEMOGLOBIN A1C: Hgb A1c MFr Bld: 7.9 % — ABNORMAL HIGH (ref 4.6–6.5)

## 2019-05-02 LAB — PSA: PSA: 0.34 ng/mL (ref 0.10–4.00)

## 2019-05-02 NOTE — Patient Instructions (Signed)

## 2019-05-02 NOTE — Progress Notes (Signed)
Subjective:  Patient ID: [name], male    DOB: 03/07/1961  Age: 58 y.o. MRN: [id number]  CC: Annual Exam, Diabetes, and Hyperlipidemia   HPI [name] presents for a CPX.  He is active and denies any recent episodes of CP, DOE, palpitations, edema, or fatigue.  Outpatient Medications Prior to Visit  Medication Sig Dispense Refill   aspirin 81 MG tablet Take 81 mg by mouth daily.       Blood Glucose Monitoring Suppl (ACCU-CHEK GUIDE) w/Device KIT 1 Act by Does not apply route 2 (two) times daily. 2 kit 0   Cholecalciferol (VITAMIN D3) 1.25 MG (50000 UT) CAPS TAKE 1 CAPSULE BY MOUTH ONE TIME PER WEEK 4 capsule 0   clopidogrel (PLAVIX) 75 MG tablet TAKE 1 TABLET BY MOUTH EVERY DAY 90 tablet 0   glucose blood (ACCU-CHEK GUIDE) test strip Use BID 100 each 11   metoprolol tartrate (LOPRESSOR) 25 MG tablet TAKE 1 TABLET BY MOUTH TWICE A DAY 180 tablet 1   nitroGLYCERIN (NITROSTAT) 0.4 MG SL tablet Place 1 tablet (0.4 mg total) under the tongue every 5 (five) minutes as needed for chest pain. 25 tablet 3   olmesartan (BENICAR) 20 MG tablet Take 1 tablet (20 mg total) by mouth daily. 90 tablet 1   rosuvastatin (CRESTOR) 40 MG tablet Take 1 tablet (40 mg total) by mouth daily. Please keep upcoming appt in November for future refills. Thank you 90 tablet 0   sitaGLIPtin-metformin (JANUMET) 50-1000 MG tablet Take 1 tablet by mouth 2 (two) times daily with a meal. 180 tablet 1   canagliflozin (INVOKANA) 100 MG TABS tablet Take 1 tablet (100 mg total) by mouth daily before breakfast. 90 tablet 1   omega-3 acid ethyl esters (LOVAZA) 1 g capsule Take 2 capsules (2 g total) by mouth 2 (two) times daily. 360 capsule 1   No facility-administered medications prior to visit.     ROS Review of Systems  Constitutional: Negative for chills, diaphoresis, fatigue and fever.  HENT: Negative.   Eyes: Negative for visual disturbance.  Respiratory: Negative for cough, chest tightness,  shortness of breath and wheezing.   Cardiovascular: Negative for chest pain, palpitations and leg swelling.  Gastrointestinal: Negative for abdominal pain, blood in stool, constipation, diarrhea, nausea and vomiting.  Endocrine: Negative.  Negative for polydipsia, polyphagia and polyuria.  Genitourinary: Negative.  Negative for difficulty urinating, scrotal swelling, testicular pain and urgency.  Musculoskeletal: Negative for arthralgias and myalgias.  Skin: Negative for color change and pallor.  Neurological: Negative.  Negative for dizziness, weakness and headaches.  Hematological: Negative for adenopathy. Does not bruise/bleed easily.  Psychiatric/Behavioral: Negative.     Objective:  BP 126/72 (BP Location: Left Arm, Patient Position: Sitting, Cuff Size: Normal)    Pulse 72    Temp 98.2 F (36.8 C) (Oral)    Ht 5' 5"  (1.651 m)    Wt 195 lb (88.5 kg)    SpO2 98%    BMI 32.45 kg/m   BP Readings from Last 3 Encounters:  05/02/19 126/72  04/25/19 126/82  10/30/18 132/80    Wt Readings from Last 3 Encounters:  05/02/19 195 lb (88.5 kg)  04/25/19 196 lb 6.4 oz (89.1 kg)  10/30/18 199 lb (90.3 kg)    Physical Exam Vitals signs reviewed. Exam conducted with a chaperone present (his wife).  Constitutional:      Appearance: Normal appearance. He is obese.  HENT:     Nose: Nose normal.  Mouth/Throat:     Mouth: Mucous membranes are moist.  Eyes:     General: No scleral icterus.    Conjunctiva/sclera: Conjunctivae normal.  Neck:     Musculoskeletal: Neck supple.  Cardiovascular:     Rate and Rhythm: Normal rate and regular rhythm.     Heart sounds: No murmur.  Pulmonary:     Effort: Pulmonary effort is normal.     Breath sounds: No stridor. No wheezing, rhonchi or rales.  Abdominal:     General: Abdomen is protuberant. Bowel sounds are normal. There is no distension.     Palpations: Abdomen is soft. There is no hepatomegaly or splenomegaly.     Hernia: There is no hernia  in the left inguinal area or right inguinal area.  Genitourinary:    Pubic Area: No rash.      Penis: Normal. No swelling or lesions.      Scrotum/Testes: Normal.        Right: Mass, tenderness or swelling not present.        Left: Mass, tenderness or swelling not present.     Epididymis:     Right: Normal. Not enlarged. No mass.     Left: Normal. Not enlarged. No mass.     Prostate: Normal. Not enlarged, not tender and no nodules present.     Rectum: Normal. Guaiac result negative. No mass, tenderness, anal fissure, external hemorrhoid or internal hemorrhoid. Normal anal tone.  Musculoskeletal: Normal range of motion.     Right lower leg: No edema.  Lymphadenopathy:     Cervical: No cervical adenopathy.     Lower Body: No right inguinal adenopathy. No left inguinal adenopathy.  Skin:    General: Skin is warm and dry.     Coloration: Skin is not pale.  Neurological:     General: No focal deficit present.     Mental Status: He is alert.  Psychiatric:        Mood and Affect: Mood normal.        Behavior: Behavior normal.     Lab Results  Component Value Date   WBC 7.2 05/02/2019   HGB 14.2 05/02/2019   HCT 43.0 05/02/2019   PLT 227.0 05/02/2019   GLUCOSE 203 (H) 05/02/2019   CHOL 118 05/02/2019   TRIG (H) 05/02/2019    481.0 Triglyceride is over 400; calculations on Lipids are invalid.   HDL 33.50 (L) 05/02/2019   LDLDIRECT 44.0 05/02/2019   LDLCALC 35 08/21/2013   ALT 23 05/02/2019   AST 20 05/02/2019   NA 135 05/02/2019   K 3.5 05/02/2019   CL 99 05/02/2019   CREATININE 0.83 05/02/2019   BUN 17 05/02/2019   CO2 26 05/02/2019   TSH 2.18 05/02/2019   PSA 0.34 05/02/2019   INR 0.9 ratio 04/22/2009   HGBA1C 7.9 (H) 05/02/2019   MICROALBUR 2.2 (H) 05/02/2019    Dg Chest 2 View  Result Date: 08/08/2016 CLINICAL DATA:  Initial evaluation for acute right upper quadrant pain status post surgery. EXAM: CHEST  2 VIEW COMPARISON:  Prior radiograph from 07/10/2010.  FINDINGS: The cardiac and mediastinal silhouettes are stable in size and contour, and remain within normal limits. Suspected underlying emphysema. No airspace consolidation, pleural effusion, or pulmonary edema is identified. There is no pneumothorax. No acute osseous abnormality identified. IMPRESSION: No active cardiopulmonary disease. Electronically Signed   By: Jeannine Boga M.D.   On: 08/08/2016 00:29   Ct Abdomen Pelvis W Contrast  Result Date: 08/07/2016  CLINICAL DATA:  Right upper quadrant abdominal pain radiating to the back for 4 days. History of laparoscopic cholecystectomy and umbilical hernia repair 1 week ago. EXAM: CT ABDOMEN AND PELVIS WITH CONTRAST TECHNIQUE: Multidetector CT imaging of the abdomen and pelvis was performed using the standard protocol following bolus administration of intravenous contrast. CONTRAST:  129m ISOVUE-300 IOPAMIDOL (ISOVUE-300) INJECTION 61% COMPARISON:  07/12/2016 FINDINGS: Lower chest: Minimal dependent changes in the lung bases. Coronary artery calcifications. Hepatobiliary: Surgical absence of the gallbladder. No bile duct dilatation. No fluid collections or infiltrative changes demonstrated in the gallbladder fossa. Diffuse fatty infiltration of the liver. No focal liver lesions. Pancreas: Unremarkable. No pancreatic ductal dilatation or surrounding inflammatory changes. Spleen: Normal in size without focal abnormality. Adrenals/Urinary Tract: Adrenal glands are unremarkable. Kidneys are normal, without renal calculi, focal lesion, or hydronephrosis. Bladder is unremarkable. Stomach/Bowel: Stomach is within normal limits. Appendix appears normal. No evidence of bowel wall thickening, distention, or inflammatory changes. Vascular/Lymphatic: Aortic atherosclerosis. No enlarged abdominal or pelvic lymph nodes. Reproductive: Borderline prostate size at 3.8 cm diameter. Prostate calcifications are present. Other: There is focal infiltration in the anterior  abdominal wall at the level of the umbilicus, likely representing postoperative change. No loculated fluid collections. No free fluid or free air in the abdomen. Abdominal wall musculature appears intact. Musculoskeletal: Degenerative changes in the spine. No destructive bone lesions. IMPRESSION: Postoperative cholecystectomy. No fluid or infiltration in the gallbladder fossa. No free fluid in the abdomen. Focal infiltration in the anterior abdominal wall around the umbilical region is likely postoperative. No loculated collection to suggest abscess. Diffuse fatty infiltration of the liver. No evidence of bowel obstruction or inflammation. Electronically Signed   By: WLucienne CapersM.D.   On: 08/07/2016 22:46    Assessment & Plan:   KTreywas seen today for annual exam, diabetes and hyperlipidemia.  Diagnoses and all orders for this visit:  Routine general medical examination at a health care facility- Exam completed, labs reviewed, vaccines reviewed, he refuses to undergo colon cancer screening, patient education was given. -     Lipid panel; Future -     PSA; Future  Type II diabetes mellitus with manifestations ([place]- His A1c remains too high at 7.9%.  I have asked him to increase the dose of the SGLT2 inhibitor and to stay on the current dose of metformin and the DPP 4 inhibitor. -     Basic metabolic panel; Future -     Hemoglobin A1c; Future -     Urinalysis, Routine w reflex microscopic; Future -     Microalbumin / creatinine urine ratio; Future -     Hepatic function panel; Future -     HM Diabetes Foot Exam -     canagliflozin (INVOKANA) 300 MG TABS tablet; Take 1 tablet (300 mg total) by mouth daily before breakfast.  Essential hypertension- His blood pressure is adequately well controlled. -     CBC with Differential; Future -     Basic metabolic panel; Future -     TSH; Future -     Urinalysis, Routine w reflex microscopic; Future  Microalbuminuria due to type 2 diabetes  mellitus (HCC) -     Urinalysis, Routine w reflex microscopic; Future -     Microalbumin / creatinine urine ratio; Future  Vitamin D deficiency -     Vitamin D 25 hydroxy; Future  Pure hyperglyceridemia - His triglycerides remain elevated.  I have asked him to stay on the current dose of  omega-3 fish oils and to improve his lifestyle modifications. -     omega-3 acid ethyl esters (LOVAZA) 1 g capsule; Take 2 capsules (2 g total) by mouth 2 (two) times daily.   I have discontinued Hadyn Blanck canagliflozin. I am also having him start on canagliflozin. Additionally, I am having him maintain his aspirin, nitroGLYCERIN, Accu-Chek Guide, glucose blood, sitaGLIPtin-metformin, metoprolol tartrate, clopidogrel, rosuvastatin, olmesartan, Vitamin D3, and omega-3 acid ethyl esters.  Meds ordered this encounter  Medications   canagliflozin (INVOKANA) 300 MG TABS tablet    Sig: Take 1 tablet (300 mg total) by mouth daily before breakfast.    Dispense:  90 tablet    Refill:  1   omega-3 acid ethyl esters (LOVAZA) 1 g capsule    Sig: Take 2 capsules (2 g total) by mouth 2 (two) times daily.    Dispense:  360 capsule    Refill:  1     Follow-up: Return in about 6 months (around 10/30/2019).  Scarlette Calico, MD

## 2019-05-03 ENCOUNTER — Other Ambulatory Visit: Payer: 59 | Admitting: *Deleted

## 2019-05-03 ENCOUNTER — Encounter: Payer: Self-pay | Admitting: Internal Medicine

## 2019-05-03 DIAGNOSIS — E782 Mixed hyperlipidemia: Secondary | ICD-10-CM

## 2019-05-03 DIAGNOSIS — I10 Essential (primary) hypertension: Secondary | ICD-10-CM

## 2019-05-03 DIAGNOSIS — I2510 Atherosclerotic heart disease of native coronary artery without angina pectoris: Secondary | ICD-10-CM

## 2019-05-03 LAB — LIPID PANEL
Chol/HDL Ratio: 3.9 ratio (ref 0.0–5.0)
Cholesterol, Total: 116 mg/dL (ref 100–199)
HDL: 30 mg/dL — ABNORMAL LOW (ref >39)
LDL Chol Calc (NIH): 40 mg/dL (ref 0–99)
Triglycerides: 308 mg/dL — ABNORMAL HIGH (ref 0–149)
VLDL Cholesterol Cal: 46 mg/dL — ABNORMAL HIGH (ref 5–40)

## 2019-05-03 LAB — URINALYSIS, ROUTINE W REFLEX MICROSCOPIC
Bilirubin Urine: NEGATIVE
Hgb urine dipstick: NEGATIVE
Ketones, ur: NEGATIVE
Leukocytes,Ua: NEGATIVE
Nitrite: NEGATIVE
RBC / HPF: NONE SEEN (ref 0–?)
Specific Gravity, Urine: 1.015 (ref 1.000–1.030)
Total Protein, Urine: NEGATIVE
Urine Glucose: >=1000 — AB
Urobilinogen, UA: 1 (ref 0.0–1.0)
pH: 6 (ref 5.0–8.0)

## 2019-05-03 LAB — HEPATIC FUNCTION PANEL
ALT: 22 IU/L (ref 0–44)
AST: 16 IU/L (ref 0–40)
Albumin: 4.7 g/dL (ref 3.8–4.9)
Alkaline Phosphatase: 53 IU/L (ref 39–117)
Bilirubin Total: 0.5 mg/dL (ref 0.0–1.2)
Bilirubin, Direct: 0.14 mg/dL (ref 0.00–0.40)
Total Protein: 7.4 g/dL (ref 6.0–8.5)

## 2019-05-03 MED ORDER — CANAGLIFLOZIN 300 MG PO TABS: 300.0000 mg | ORAL_TABLET | Freq: Every day | ORAL | 1 refills | Status: DC

## 2019-05-03 MED ORDER — OMEGA-3-ACID ETHYL ESTERS 1 G PO CAPS: 2.0000 | ORAL_CAPSULE | Freq: Two times a day (BID) | ORAL | 1 refills | Status: DC

## 2019-05-10 ENCOUNTER — Other Ambulatory Visit: Payer: Self-pay | Admitting: Internal Medicine

## 2019-05-10 DIAGNOSIS — E118 Type 2 diabetes mellitus with unspecified complications: Secondary | ICD-10-CM

## 2019-06-08 ENCOUNTER — Other Ambulatory Visit: Payer: Self-pay | Admitting: Cardiovascular Disease

## 2019-06-08 DIAGNOSIS — I10 Essential (primary) hypertension: Secondary | ICD-10-CM

## 2019-06-08 DIAGNOSIS — I2510 Atherosclerotic heart disease of native coronary artery without angina pectoris: Secondary | ICD-10-CM

## 2019-06-08 DIAGNOSIS — E781 Pure hyperglyceridemia: Secondary | ICD-10-CM

## 2019-07-09 ENCOUNTER — Other Ambulatory Visit: Payer: Self-pay | Admitting: Cardiovascular Disease

## 2019-08-06 ENCOUNTER — Telehealth: Payer: Self-pay | Admitting: Internal Medicine

## 2019-08-06 NOTE — Telephone Encounter (Signed)
    Spouse calling to report patient is covid positive No callback needed

## 2019-09-15 ENCOUNTER — Other Ambulatory Visit: Payer: Self-pay | Admitting: Internal Medicine

## 2019-09-15 DIAGNOSIS — E118 Type 2 diabetes mellitus with unspecified complications: Secondary | ICD-10-CM

## 2019-09-30 ENCOUNTER — Other Ambulatory Visit: Payer: Self-pay | Admitting: Internal Medicine

## 2019-09-30 DIAGNOSIS — E118 Type 2 diabetes mellitus with unspecified complications: Secondary | ICD-10-CM

## 2019-09-30 DIAGNOSIS — R809 Proteinuria, unspecified: Secondary | ICD-10-CM

## 2019-09-30 DIAGNOSIS — E1129 Type 2 diabetes mellitus with other diabetic kidney complication: Secondary | ICD-10-CM

## 2019-09-30 DIAGNOSIS — E781 Pure hyperglyceridemia: Secondary | ICD-10-CM

## 2019-10-01 ENCOUNTER — Other Ambulatory Visit: Payer: Self-pay | Admitting: Internal Medicine

## 2019-10-01 DIAGNOSIS — E118 Type 2 diabetes mellitus with unspecified complications: Secondary | ICD-10-CM

## 2019-10-01 DIAGNOSIS — E781 Pure hyperglyceridemia: Secondary | ICD-10-CM

## 2019-10-01 MED ORDER — OLMESARTAN MEDOXOMIL 20 MG PO TABS: 20.0000 mg | ORAL_TABLET | Freq: Every day | ORAL | 0 refills | Status: DC

## 2019-10-01 MED ORDER — OMEGA-3-ACID ETHYL ESTERS 1 G PO CAPS: 2.0000 | ORAL_CAPSULE | Freq: Two times a day (BID) | ORAL | 0 refills | Status: DC

## 2019-10-30 ENCOUNTER — Ambulatory Visit: Payer: 59 | Admitting: Internal Medicine

## 2019-11-05 ENCOUNTER — Ambulatory Visit: Payer: 59 | Admitting: Internal Medicine

## 2019-11-05 ENCOUNTER — Encounter: Payer: Self-pay | Admitting: Internal Medicine

## 2019-11-05 ENCOUNTER — Other Ambulatory Visit: Payer: Self-pay

## 2019-11-05 VITALS — BP 130/70 | HR 72 | Temp 98.3°F | Ht 65.0 in | Wt 190.0 lb

## 2019-11-05 DIAGNOSIS — I10 Essential (primary) hypertension: Secondary | ICD-10-CM

## 2019-11-05 DIAGNOSIS — E781 Pure hyperglyceridemia: Secondary | ICD-10-CM | POA: Diagnosis not present

## 2019-11-05 DIAGNOSIS — E118 Type 2 diabetes mellitus with unspecified complications: Secondary | ICD-10-CM

## 2019-11-05 DIAGNOSIS — E785 Hyperlipidemia, unspecified: Secondary | ICD-10-CM

## 2019-11-05 NOTE — Progress Notes (Signed)
Subjective:  Patient ID: [name], male    DOB: [birth year]  Age: 59 y.o. MRN: [id number]  CC: Hypertension and Diabetes  This visit occurred during the SARS-CoV-2 public health emergency.  Safety protocols were in place, including screening questions prior to the visit, additional usage of staff PPE, and extensive cleaning of exam room while observing appropriate contact time as indicated for disinfecting solutions.     HPI Naftali Carchi presents for f/up - He tells me his blood pressure and blood sugar have been well controlled.  He is active and denies any recent episodes of chest pain, shortness of breath, diaphoresis, edema, or fatigue.  He has lost 5 pounds since his last visit with lifestyle modifications.  He denies polys.  Outpatient Medications Prior to Visit  Medication Sig Dispense Refill   Ascorbic Acid (VITAMIN C) 1000 MG tablet Take 1,000 mg by mouth daily.     aspirin 81 MG tablet Take 81 mg by mouth daily.       Blood Glucose Monitoring Suppl (ACCU-CHEK GUIDE) w/Device KIT 1 Act by Does not apply route 2 (two) times daily. 2 kit 0   canagliflozin (INVOKANA) 300 MG TABS tablet Take 1 tablet (300 mg total) by mouth daily before breakfast. 90 tablet 1   Cholecalciferol (VITAMIN D3) 1.25 MG (50000 UT) CAPS TAKE 1 CAPSULE BY MOUTH ONE TIME PER WEEK 4 capsule 0   clopidogrel (PLAVIX) 75 MG tablet TAKE 1 TABLET BY MOUTH EVERY DAY 90 tablet 3   glucose blood (ACCU-CHEK GUIDE) test strip Use BID 100 each 11   JANUMET 50-1000 MG tablet TAKE 1 TABLET BY MOUTH 2 (TWO) TIMES DAILY WITH A MEAL. 60 tablet 2   metoprolol tartrate (LOPRESSOR) 25 MG tablet TAKE 1 TABLET BY MOUTH TWICE A DAY 180 tablet 3   nitroGLYCERIN (NITROSTAT) 0.4 MG SL tablet Place 1 tablet (0.4 mg total) under the tongue every 5 (five) minutes as needed for chest pain. 25 tablet 3   olmesartan (BENICAR) 20 MG tablet Take 1 tablet (20 mg total) by mouth daily. 90 tablet 0   omega-3 acid ethyl esters  (LOVAZA) 1 g capsule Take 2 capsules (2 g total) by mouth 2 (two) times daily. 360 capsule 0   rosuvastatin (CRESTOR) 40 MG tablet Take 1 tablet (40 mg total) by mouth daily. 90 tablet 3   No facility-administered medications prior to visit.    ROS Review of Systems  Constitutional: Negative for diaphoresis, fatigue and unexpected weight change.  HENT: Negative.   Eyes: Negative for visual disturbance.  Respiratory: Negative for cough, chest tightness, shortness of breath and wheezing.   Cardiovascular: Negative for chest pain, palpitations and leg swelling.  Gastrointestinal: Negative for abdominal pain, constipation, diarrhea, nausea and vomiting.  Endocrine: Negative.   Genitourinary: Negative.  Negative for difficulty urinating.  Musculoskeletal: Negative.  Negative for arthralgias and myalgias.  Skin: Negative for color change.  Neurological: Negative for dizziness, weakness and light-headedness.  Hematological: Negative for adenopathy. Does not bruise/bleed easily.  Psychiatric/Behavioral: Negative.     Objective:  BP 130/70 (BP Location: Left Arm, Patient Position: Sitting, Cuff Size: Large)    Pulse 72    Temp 98.3 F (36.8 C) (Oral)    Ht 5' 5"  (1.651 m)    Wt 190 lb (86.2 kg)    SpO2 95%    BMI 31.62 kg/m   BP Readings from Last 3 Encounters:  11/05/19 130/70  05/02/19 126/72  04/25/19 126/82    Wt Readings  from Last 3 Encounters:  11/05/19 190 lb (86.2 kg)  05/02/19 195 lb (88.5 kg)  04/25/19 196 lb 6.4 oz (89.1 kg)    Physical Exam Vitals reviewed.  Constitutional:      Appearance: Normal appearance.  HENT:     Nose: Nose normal.     Mouth/Throat:     Mouth: Mucous membranes are moist.  Eyes:     General: No scleral icterus.    Conjunctiva/sclera: Conjunctivae normal.  Cardiovascular:     Rate and Rhythm: Normal rate and regular rhythm.     Heart sounds: No murmur.  Pulmonary:     Effort: Pulmonary effort is normal.     Breath sounds: No stridor.  No wheezing, rhonchi or rales.  Abdominal:     General: Abdomen is flat.     Palpations: There is no mass.     Tenderness: There is no abdominal tenderness. There is no guarding.  Musculoskeletal:        General: Normal range of motion.     Cervical back: Neck supple.     Right lower leg: No edema.     Left lower leg: No edema.  Lymphadenopathy:     Cervical: No cervical adenopathy.  Skin:    General: Skin is warm and dry.     Coloration: Skin is not pale.  Neurological:     General: No focal deficit present.     Mental Status: He is alert.  Psychiatric:        Mood and Affect: Mood normal.        Behavior: Behavior normal.     Lab Results  Component Value Date   WBC 7.2 05/02/2019   HGB 14.2 05/02/2019   HCT 43.0 05/02/2019   PLT 227.0 05/02/2019   GLUCOSE 126 (H) 11/05/2019   CHOL 116 11/05/2019   TRIG 376.0 (H) 11/05/2019   HDL 31.30 (L) 11/05/2019   LDLDIRECT 46.0 11/05/2019   LDLCALC 40 05/03/2019   ALT 22 05/03/2019   AST 16 05/03/2019   NA 138 11/05/2019   K 3.8 11/05/2019   CL 104 11/05/2019   CREATININE 0.81 11/05/2019   BUN 15 11/05/2019   CO2 26 11/05/2019   TSH 2.18 05/02/2019   PSA 0.34 05/02/2019   INR 0.9 ratio 04/22/2009   HGBA1C 7.4 (H) 11/05/2019   MICROALBUR 2.2 (H) 05/02/2019    DG Chest 2 View  Result Date: 08/08/2016 CLINICAL DATA:  Initial evaluation for acute right upper quadrant pain status post surgery. EXAM: CHEST  2 VIEW COMPARISON:  Prior radiograph from 07/10/2010. FINDINGS: The cardiac and mediastinal silhouettes are stable in size and contour, and remain within normal limits. Suspected underlying emphysema. No airspace consolidation, pleural effusion, or pulmonary edema is identified. There is no pneumothorax. No acute osseous abnormality identified. IMPRESSION: No active cardiopulmonary disease. Electronically Signed   By: Jeannine Boga M.D.   On: 08/08/2016 00:29   CT Abdomen Pelvis W Contrast  Result Date:  08/07/2016 CLINICAL DATA:  Right upper quadrant abdominal pain radiating to the back for 4 days. History of laparoscopic cholecystectomy and umbilical hernia repair 1 week ago. EXAM: CT ABDOMEN AND PELVIS WITH CONTRAST TECHNIQUE: Multidetector CT imaging of the abdomen and pelvis was performed using the standard protocol following bolus administration of intravenous contrast. CONTRAST:  136m ISOVUE-300 IOPAMIDOL (ISOVUE-300) INJECTION 61% COMPARISON:  07/12/2016 FINDINGS: Lower chest: Minimal dependent changes in the lung bases. Coronary artery calcifications. Hepatobiliary: Surgical absence of the gallbladder. No bile duct dilatation. No  fluid collections or infiltrative changes demonstrated in the gallbladder fossa. Diffuse fatty infiltration of the liver. No focal liver lesions. Pancreas: Unremarkable. No pancreatic ductal dilatation or surrounding inflammatory changes. Spleen: Normal in size without focal abnormality. Adrenals/Urinary Tract: Adrenal glands are unremarkable. Kidneys are normal, without renal calculi, focal lesion, or hydronephrosis. Bladder is unremarkable. Stomach/Bowel: Stomach is within normal limits. Appendix appears normal. No evidence of bowel wall thickening, distention, or inflammatory changes. Vascular/Lymphatic: Aortic atherosclerosis. No enlarged abdominal or pelvic lymph nodes. Reproductive: Borderline prostate size at 3.8 cm diameter. Prostate calcifications are present. Other: There is focal infiltration in the anterior abdominal wall at the level of the umbilicus, likely representing postoperative change. No loculated fluid collections. No free fluid or free air in the abdomen. Abdominal wall musculature appears intact. Musculoskeletal: Degenerative changes in the spine. No destructive bone lesions. IMPRESSION: Postoperative cholecystectomy. No fluid or infiltration in the gallbladder fossa. No free fluid in the abdomen. Focal infiltration in the anterior abdominal wall around the  umbilical region is likely postoperative. No loculated collection to suggest abscess. Diffuse fatty infiltration of the liver. No evidence of bowel obstruction or inflammation. Electronically Signed   By: Lucienne Capers M.D.   On: 08/07/2016 22:46    Assessment & Plan:   Jacques was seen today for hypertension and diabetes.  Diagnoses and all orders for this visit:  Essential hypertension- His blood pressure is adequately well controlled.  Electrolytes and renal function are normal. -     Basic metabolic panel; Future -     Basic metabolic panel  Type II diabetes mellitus with manifestations ([place])- His A1c is down to 7.4%.  Improvement noted.  We will continue the current meds.  Have also encouraged him to improve his lifestyle modifications. -     Basic metabolic panel; Future -     Hemoglobin A1c; Future -     Ambulatory referral to Ophthalmology -     Hemoglobin A1c -     Basic metabolic panel  Hyperlipidemia LDL goal <70- He has achieved his LDL goal and is doing well on the statin. -     Lipid panel; Future -     Lipid panel  Pure hyperglyceridemia- His triglycerides remain elevated.  I have asked him to continue taking the omega-3 fish oil supplement and to improve his lifestyle modifications. -     Lipid panel; Future -     Lipid panel  Other orders -     LDL cholesterol, direct   I am having Fredricka Bonine maintain his aspirin, nitroGLYCERIN, Accu-Chek Guide, glucose blood, Vitamin D3, canagliflozin, clopidogrel, metoprolol tartrate, rosuvastatin, Janumet, olmesartan, omega-3 acid ethyl esters, and vitamin C.  No orders of the defined types were placed in this encounter.    Follow-up: Return in about 6 months (around 05/07/2020).  Scarlette Calico, MD

## 2019-11-05 NOTE — Patient Instructions (Signed)
Type 2 Diabetes Mellitus, Diagnosis, Adult Type 2 diabetes (type 2 diabetes mellitus) is a long-term (chronic) disease. In type 2 diabetes, one or both of these problems may be present:  The pancreas does not make enough of a hormone called insulin.  Cells in the body do not respond properly to insulin that the body makes (insulin resistance). Normally, insulin allows blood sugar (glucose) to enter cells in the body. The cells use glucose for energy. Insulin resistance or lack of insulin causes excess glucose to build up in the blood instead of going into cells. As a result, high blood glucose (hyperglycemia) develops. What increases the risk? The following factors may make you more likely to develop type 2 diabetes:  Having a family member with type 2 diabetes.  Being overweight or obese.  Having an inactive (sedentary) lifestyle.  Having been diagnosed with insulin resistance.  Having a history of prediabetes, gestational diabetes, or polycystic ovary syndrome (PCOS).  Being of American-Indian, African-American, Hispanic/Latino, or Asian/Pacific Islander descent. What are the signs or symptoms? In the early stage of this condition, you may not have symptoms. Symptoms develop slowly and may include:  Increased thirst (polydipsia).  Increased hunger(polyphagia).  Increased urination (polyuria).  Increased urination during the night (nocturia).  Unexplained weight loss.  Frequent infections that keep coming back (recurring).  Fatigue.  Weakness.  Vision changes, such as blurry vision.  Cuts or bruises that are slow to heal.  Tingling or numbness in the hands or feet.  Dark patches on the skin (acanthosis nigricans). How is this diagnosed? This condition is diagnosed based on your symptoms, your medical history, a physical exam, and your blood glucose level. Your blood glucose may be checked with one or more of the following blood tests:  A fasting blood glucose (FBG)  test. You will not be allowed to eat (you will fast) for 8 hours or longer before a blood sample is taken.  A random blood glucose test. This test checks blood glucose at any time of day regardless of when you ate.  An A1c (hemoglobin A1c) blood test. This test provides information about blood glucose control over the previous 2-3 months.  An oral glucose tolerance test (OGTT). This test measures your blood glucose at two times: ? After fasting. This is your baseline blood glucose level. ? Two hours after drinking a beverage that contains glucose. You may be diagnosed with type 2 diabetes if:  Your FBG level is 126 mg/dL (7.0 mmol/L) or higher.  Your random blood glucose level is 200 mg/dL (11.1 mmol/L) or higher.  Your A1c level is 6.5% or higher.  Your OGTT result is higher than 200 mg/dL (11.1 mmol/L). These blood tests may be repeated to confirm your diagnosis. How is this treated? Your treatment may be managed by a specialist called an endocrinologist. Type 2 diabetes may be treated by following instructions from your health care provider about:  Making diet and lifestyle changes. This may include: ? Following an individualized nutrition plan that is developed by a diet and nutrition specialist (registered dietitian). ? Exercising regularly. ? Finding ways to manage stress.  Checking your blood glucose level as often as told.  Taking diabetes medicines or insulin daily. This helps to keep your blood glucose levels in the healthy range. ? If you use insulin, you may need to adjust the dosage depending on how physically active you are and what foods you eat. Your health care provider will tell you how to adjust your dosage.    Taking medicines to help prevent complications from diabetes, such as: ? Aspirin. ? Medicine to lower cholesterol. ? Medicine to control blood pressure. Your health care provider will set individualized treatment goals for you. Your goals will be based on  your age, other medical conditions you have, and how you respond to diabetes treatment. Generally, the goal of treatment is to maintain the following blood glucose levels:  Before meals (preprandial): 80-130 mg/dL (4.4-7.2 mmol/L).  After meals (postprandial): below 180 mg/dL (10 mmol/L).  A1c level: less than 7%. Follow these instructions at home: Questions to ask your health care provider  Consider asking the following questions: ? Do I need to meet with a diabetes educator? ? Where can I find a support group for people with diabetes? ? What equipment will I need to manage my diabetes at home? ? What diabetes medicines do I need, and when should I take them? ? How often do I need to check my blood glucose? ? What number can I call if I have questions? ? When is my next appointment? General instructions  Take over-the-counter and prescription medicines only as told by your health care provider.  Keep all follow-up visits as told by your health care provider. This is important.  For more information about diabetes, visit: ? American Diabetes Association (ADA): www.diabetes.org ? American Association of Diabetes Educators (AADE): www.diabeteseducator.org Contact a health care provider if:  Your blood glucose is at or above 240 mg/dL (13.3 mmol/L) for 2 days in a row.  You have been sick or have had a fever for 2 days or longer, and you are not getting better.  You have any of the following problems for more than 6 hours: ? You cannot eat or drink. ? You have nausea and vomiting. ? You have diarrhea. Get help right away if:  Your blood glucose is lower than 54 mg/dL (3.0 mmol/L).  You become confused or you have trouble thinking clearly.  You have difficulty breathing.  You have moderate or large ketone levels in your urine. Summary  Type 2 diabetes (type 2 diabetes mellitus) is a long-term (chronic) disease. In type 2 diabetes, the pancreas does not make enough of a  hormone called insulin, or cells in the body do not respond properly to insulin that the body makes (insulin resistance).  This condition is treated by making diet and lifestyle changes and taking diabetes medicines or insulin.  Your health care provider will set individualized treatment goals for you. Your goals will be based on your age, other medical conditions you have, and how you respond to diabetes treatment.  Keep all follow-up visits as told by your health care provider. This is important. This information is not intended to replace advice given to you by your health care provider. Make sure you discuss any questions you have with your health care provider. Document Revised: 08/05/2017 Document Reviewed: 07/11/2015 Elsevier Patient Education  2020 Elsevier Inc.  

## 2019-11-06 ENCOUNTER — Encounter: Payer: Self-pay | Admitting: Internal Medicine

## 2019-11-06 LAB — BASIC METABOLIC PANEL
BUN: 15 mg/dL (ref 6–23)
CO2: 26 mEq/L (ref 19–32)
Calcium: 9.6 mg/dL (ref 8.4–10.5)
Chloride: 104 mEq/L (ref 96–112)
Creatinine, Ser: 0.81 mg/dL (ref 0.40–1.50)
GFR: 98.4 mL/min (ref >60.00)
Glucose, Bld: 126 mg/dL — ABNORMAL HIGH (ref 70–99)
Potassium: 3.8 mEq/L (ref 3.5–5.1)
Sodium: 138 mEq/L (ref 135–145)

## 2019-11-06 LAB — LIPID PANEL
Cholesterol: 116 mg/dL (ref 0–200)
HDL: 31.3 mg/dL — ABNORMAL LOW (ref >39.00)
NonHDL: 84.54
Total CHOL/HDL Ratio: 4
Triglycerides: 376 mg/dL — ABNORMAL HIGH (ref 0.0–149.0)
VLDL: 75.2 mg/dL — ABNORMAL HIGH (ref 0.0–40.0)

## 2019-11-06 LAB — HEMOGLOBIN A1C: Hgb A1c MFr Bld: 7.4 % — ABNORMAL HIGH (ref 4.6–6.5)

## 2019-11-06 LAB — LDL CHOLESTEROL, DIRECT: Direct LDL: 46 mg/dL

## 2019-11-13 ENCOUNTER — Other Ambulatory Visit: Payer: Self-pay | Admitting: Internal Medicine

## 2019-11-13 ENCOUNTER — Encounter: Payer: Self-pay | Admitting: Internal Medicine

## 2019-11-13 DIAGNOSIS — E118 Type 2 diabetes mellitus with unspecified complications: Secondary | ICD-10-CM

## 2019-12-23 ENCOUNTER — Other Ambulatory Visit: Payer: Self-pay | Admitting: Internal Medicine

## 2019-12-23 DIAGNOSIS — E118 Type 2 diabetes mellitus with unspecified complications: Secondary | ICD-10-CM

## 2020-02-03 ENCOUNTER — Other Ambulatory Visit: Payer: Self-pay | Admitting: Internal Medicine

## 2020-02-03 DIAGNOSIS — E781 Pure hyperglyceridemia: Secondary | ICD-10-CM

## 2020-03-11 ENCOUNTER — Other Ambulatory Visit: Payer: Self-pay | Admitting: Internal Medicine

## 2020-03-11 DIAGNOSIS — E118 Type 2 diabetes mellitus with unspecified complications: Secondary | ICD-10-CM

## 2020-03-22 ENCOUNTER — Other Ambulatory Visit: Payer: Self-pay | Admitting: Internal Medicine

## 2020-03-22 DIAGNOSIS — E118 Type 2 diabetes mellitus with unspecified complications: Secondary | ICD-10-CM

## 2020-05-06 ENCOUNTER — Other Ambulatory Visit: Payer: Self-pay

## 2020-05-06 ENCOUNTER — Ambulatory Visit (INDEPENDENT_AMBULATORY_CARE_PROVIDER_SITE_OTHER): Payer: 59 | Admitting: Internal Medicine

## 2020-05-06 ENCOUNTER — Encounter: Payer: Self-pay | Admitting: Internal Medicine

## 2020-05-06 VITALS — BP 134/76 | HR 75 | Temp 98.7°F | Resp 16 | Ht 65.0 in | Wt 194.0 lb

## 2020-05-06 DIAGNOSIS — Z1211 Encounter for screening for malignant neoplasm of colon: Secondary | ICD-10-CM

## 2020-05-06 DIAGNOSIS — E785 Hyperlipidemia, unspecified: Secondary | ICD-10-CM | POA: Diagnosis not present

## 2020-05-06 DIAGNOSIS — R809 Proteinuria, unspecified: Secondary | ICD-10-CM | POA: Diagnosis not present

## 2020-05-06 DIAGNOSIS — E781 Pure hyperglyceridemia: Secondary | ICD-10-CM

## 2020-05-06 DIAGNOSIS — Z23 Encounter for immunization: Secondary | ICD-10-CM | POA: Diagnosis not present

## 2020-05-06 DIAGNOSIS — E118 Type 2 diabetes mellitus with unspecified complications: Secondary | ICD-10-CM | POA: Diagnosis not present

## 2020-05-06 DIAGNOSIS — E1129 Type 2 diabetes mellitus with other diabetic kidney complication: Secondary | ICD-10-CM | POA: Diagnosis not present

## 2020-05-06 DIAGNOSIS — Z0000 Encounter for general adult medical examination without abnormal findings: Secondary | ICD-10-CM | POA: Diagnosis not present

## 2020-05-06 DIAGNOSIS — I10 Essential (primary) hypertension: Secondary | ICD-10-CM

## 2020-05-06 NOTE — Patient Instructions (Signed)

## 2020-05-06 NOTE — Progress Notes (Signed)
Subjective:  Patient ID: [name], male    DOB: January 06, 1961  Age: 59 y.o. MRN: [id number]  CC: Annual Exam, Hypertension, Hyperlipidemia, and Diabetes  This visit occurred during the SARS-CoV-2 public health emergency.  Safety protocols were in place, including screening questions prior to the visit, additional usage of staff PPE, and extensive cleaning of exam room while observing appropriate contact time as indicated for disinfecting solutions.    HPI [name] presents for a CPX.  He walks about 2 to 3 miles a day and does not experience CP, DOE, palpitations, edema, or fatigue.  He complains of weight gain.  He does not monitor his blood pressure or his blood sugar but he tells me he is compliant with his meds.  He denies any dizziness, lightheadedness, diaphoresis, or polys.  Outpatient Medications Prior to Visit  Medication Sig Dispense Refill  . aspirin 81 MG tablet Take 81 mg by mouth daily.      . Blood Glucose Monitoring Suppl (ACCU-CHEK GUIDE) w/Device KIT 1 Act by Does not apply route 2 (two) times daily. 2 kit 0  . clopidogrel (PLAVIX) 75 MG tablet TAKE 1 TABLET BY MOUTH EVERY DAY 90 tablet 3  . glucose blood (ACCU-CHEK GUIDE) test strip Use BID 100 each 11  . INVOKANA 300 MG TABS tablet TAKE 1 TABLET (300 MG TOTAL) BY MOUTH DAILY BEFORE BREAKFAST. 90 tablet 0  . metoprolol tartrate (LOPRESSOR) 25 MG tablet TAKE 1 TABLET BY MOUTH TWICE A DAY 180 tablet 3  . nitroGLYCERIN (NITROSTAT) 0.4 MG SL tablet Place 1 tablet (0.4 mg total) under the tongue every 5 (five) minutes as needed for chest pain. 25 tablet 3  . olmesartan (BENICAR) 20 MG tablet TAKE 1 TABLET BY MOUTH EVERY DAY 90 tablet 0  . omega-3 acid ethyl esters (LOVAZA) 1 g capsule TAKE 2 CAPSULES (2 G TOTAL) BY MOUTH 2 (TWO) TIMES DAILY. 360 capsule 1  . rosuvastatin (CRESTOR) 40 MG tablet Take 1 tablet (40 mg total) by mouth daily. 90 tablet 3  . Ascorbic Acid (VITAMIN C) 1000 MG tablet Take 1,000 mg by mouth  daily.    . Cholecalciferol (VITAMIN D3) 1.25 MG (50000 UT) CAPS TAKE 1 CAPSULE BY MOUTH ONE TIME PER WEEK 4 capsule 0  . JANUMET 50-1000 MG tablet TAKE 1 TABLET BY MOUTH 2 (TWO) TIMES DAILY WITH A MEAL. 60 tablet 5   No facility-administered medications prior to visit.    ROS Review of Systems  Constitutional: Positive for unexpected weight change (wt gain). Negative for appetite change, chills, diaphoresis and fatigue.  HENT: Negative.   Eyes: Negative.   Respiratory: Positive for apnea. Negative for cough, chest tightness, shortness of breath and wheezing.   Cardiovascular: Negative for chest pain, palpitations and leg swelling.  Gastrointestinal: Negative for abdominal pain, blood in stool, constipation, diarrhea, nausea and vomiting.  Endocrine: Negative.  Negative for polydipsia, polyphagia and polyuria.  Genitourinary: Negative.  Negative for difficulty urinating, scrotal swelling and testicular pain.  Musculoskeletal: Negative for arthralgias, back pain and myalgias.  Skin: Negative.  Negative for color change and pallor.  Neurological: Negative.  Negative for dizziness, weakness, light-headedness and headaches.  Hematological: Negative for adenopathy. Does not bruise/bleed easily.  Psychiatric/Behavioral: Negative.     Objective:  BP 134/76   Pulse 75   Temp 98.7 F (37.1 C) (Oral)   Resp 16   Ht 5' 5" (1.651 m)   Wt 194 lb (88 kg)   SpO2 96%   BMI  32.28 kg/m   BP Readings from Last 3 Encounters:  05/06/20 134/76  11/05/19 130/70  05/02/19 126/72    Wt Readings from Last 3 Encounters:  05/06/20 194 lb (88 kg)  11/05/19 190 lb (86.2 kg)  05/02/19 195 lb (88.5 kg)    Physical Exam Vitals reviewed.  Constitutional:      Appearance: He is obese.  HENT:     Nose: Nose normal.     Mouth/Throat:     Mouth: Mucous membranes are moist.  Eyes:     General: No scleral icterus.    Conjunctiva/sclera: Conjunctivae normal.  Cardiovascular:     Rate and Rhythm:  Normal rate and regular rhythm.     Heart sounds: No murmur heard.   Pulmonary:     Effort: Pulmonary effort is normal.     Breath sounds: No stridor. No wheezing, rhonchi or rales.  Abdominal:     General: Abdomen is protuberant. Bowel sounds are normal. There is no distension.     Palpations: Abdomen is soft. There is no hepatomegaly, splenomegaly or mass.     Tenderness: There is no abdominal tenderness. There is no guarding.     Hernia: No hernia is present. There is no hernia in the left inguinal area or right inguinal area.  Genitourinary:    Pubic Area: No rash.      Penis: Normal and circumcised. No discharge, swelling or lesions.      Testes: Normal.        Right: Mass or tenderness not present.        Left: Mass or tenderness not present.     Epididymis:     Right: Normal.     Left: Normal.     Prostate: Normal. Not enlarged, not tender and no nodules present.     Rectum: Normal. Guaiac result negative. No mass, tenderness, anal fissure, external hemorrhoid or internal hemorrhoid. Normal anal tone.  Musculoskeletal:        General: Normal range of motion.     Cervical back: Neck supple.     Right lower leg: No edema.     Left lower leg: No edema.  Lymphadenopathy:     Cervical: No cervical adenopathy.     Lower Body: No right inguinal adenopathy. No left inguinal adenopathy.  Skin:    General: Skin is warm and dry.     Coloration: Skin is not pale.  Neurological:     General: No focal deficit present.     Mental Status: He is alert and oriented to person, place, and time. Mental status is at baseline.  Psychiatric:        Mood and Affect: Mood normal.        Behavior: Behavior normal.     Lab Results  Component Value Date   WBC 8.3 05/06/2020   HGB 15.2 05/06/2020   HCT 45.5 05/06/2020   PLT 209.0 05/06/2020   GLUCOSE 132 (H) 05/06/2020   CHOL 129 05/06/2020   TRIG 396.0 (H) 05/06/2020   HDL 33.80 (L) 05/06/2020   LDLDIRECT 58.0 05/06/2020   LDLCALC 40  05/03/2019   ALT 20 05/06/2020   AST 18 05/06/2020   NA 137 05/06/2020   K 3.8 05/06/2020   CL 100 05/06/2020   CREATININE 0.75 05/06/2020   BUN 13 05/06/2020   CO2 28 05/06/2020   TSH 2.18 05/02/2019   PSA 0.34 05/06/2020   INR 0.9 ratio 04/22/2009   HGBA1C 7.7 (H) 05/06/2020   MICROALBUR 8.6 (  H) 05/06/2020    DG Chest 2 View  Result Date: 08/08/2016 CLINICAL DATA:  Initial evaluation for acute right upper quadrant pain status post surgery. EXAM: CHEST  2 VIEW COMPARISON:  Prior radiograph from 07/10/2010. FINDINGS: The cardiac and mediastinal silhouettes are stable in size and contour, and remain within normal limits. Suspected underlying emphysema. No airspace consolidation, pleural effusion, or pulmonary edema is identified. There is no pneumothorax. No acute osseous abnormality identified. IMPRESSION: No active cardiopulmonary disease. Electronically Signed   By: Jeannine Boga M.D.   On: 08/08/2016 00:29   CT Abdomen Pelvis W Contrast  Result Date: 08/07/2016 CLINICAL DATA:  Right upper quadrant abdominal pain radiating to the back for 4 days. History of laparoscopic cholecystectomy and umbilical hernia repair 1 week ago. EXAM: CT ABDOMEN AND PELVIS WITH CONTRAST TECHNIQUE: Multidetector CT imaging of the abdomen and pelvis was performed using the standard protocol following bolus administration of intravenous contrast. CONTRAST:  150m ISOVUE-300 IOPAMIDOL (ISOVUE-300) INJECTION 61% COMPARISON:  07/12/2016 FINDINGS: Lower chest: Minimal dependent changes in the lung bases. Coronary artery calcifications. Hepatobiliary: Surgical absence of the gallbladder. No bile duct dilatation. No fluid collections or infiltrative changes demonstrated in the gallbladder fossa. Diffuse fatty infiltration of the liver. No focal liver lesions. Pancreas: Unremarkable. No pancreatic ductal dilatation or surrounding inflammatory changes. Spleen: Normal in size without focal abnormality.  Adrenals/Urinary Tract: Adrenal glands are unremarkable. Kidneys are normal, without renal calculi, focal lesion, or hydronephrosis. Bladder is unremarkable. Stomach/Bowel: Stomach is within normal limits. Appendix appears normal. No evidence of bowel wall thickening, distention, or inflammatory changes. Vascular/Lymphatic: Aortic atherosclerosis. No enlarged abdominal or pelvic lymph nodes. Reproductive: Borderline prostate size at 3.8 cm diameter. Prostate calcifications are present. Other: There is focal infiltration in the anterior abdominal wall at the level of the umbilicus, likely representing postoperative change. No loculated fluid collections. No free fluid or free air in the abdomen. Abdominal wall musculature appears intact. Musculoskeletal: Degenerative changes in the spine. No destructive bone lesions. IMPRESSION: Postoperative cholecystectomy. No fluid or infiltration in the gallbladder fossa. No free fluid in the abdomen. Focal infiltration in the anterior abdominal wall around the umbilical region is likely postoperative. No loculated collection to suggest abscess. Diffuse fatty infiltration of the liver. No evidence of bowel obstruction or inflammation. Electronically Signed   By: WLucienne CapersM.D.   On: 08/07/2016 22:46    Assessment & Plan:   [place] for annual exam, hypertension, hyperlipidemia and diabetes.  Diagnoses and all orders for this visit:  Type II diabetes mellitus with manifestations ([place]- His A1c is up to 7.7%.  In addition to improve lifestyle modifications I recommended that he transition from the DPP 4 inhibitor to a GLP-1 agonist.  Will increase the dose of semaglutide over the next few months.  Will continue Metformin and the SGLT2 inhibitor. -     Basic metabolic panel; Future -     Urinalysis, Routine w reflex microscopic; Future -     Hemoglobin A1c; Future -     HM Diabetes Foot Exam -     Hemoglobin A1c -     Urinalysis, Routine w reflex  microscopic -     Basic metabolic panel -     Ambulatory referral to Ophthalmology -     Semaglutide (RYBELSUS) 3 MG TABS; Take 1 tablet by mouth daily. -     metFORMIN (GLUCOPHAGE XR) 750 MG 24 hr tablet; Take 2 tablets (1,500 mg total) by mouth daily with breakfast.  Routine general medical examination at a health care facility- Exam completed, labs reviewed, vaccines reviewed and updated, cancer screenings addressed, patient education was given. -     Lipid panel; Future -     PSA; Future -     PSA -     Lipid panel  Pure hyperglyceridemia- His triglycerides remain elevated.  Will continue the fish oil supplement.  I encouraged him to improve his lifestyle modifications.  Hyperlipidemia LDL goal <70- He has achieved his LDL goal is doing well on the statin. -     Hepatic function panel; Future -     Hepatic function panel  Microalbuminuria due to type 2 diabetes mellitus ([place])- Will continue to work on tight control of his blood pressure and blood sugar. -     Microalbumin / creatinine urine ratio; Future -     Urinalysis, Routine w reflex microscopic; Future -     Urinalysis, Routine w reflex microscopic -     Microalbumin / creatinine urine ratio  Essential hypertension- His blood pressure is adequately well controlled.  Will continue the ARB at the current dose. -     CBC with Differential/Platelet; Future -     Basic metabolic panel; Future -     Urinalysis, Routine w reflex microscopic; Future -     Urinalysis, Routine w reflex microscopic -     Basic metabolic panel -     CBC with Differential/Platelet  Colon cancer screening -     Ambulatory referral to Gastroenterology  Other orders -     Pneumococcal conjugate vaccine 13-valent -     Varicella-zoster vaccine IM (Shingrix) -     LDL cholesterol, direct   I have discontinued Gaetana Michaelis Vitamin D3, vitamin C, and Janumet. I am also having him start on Rybelsus and metFORMIN. Additionally, I am having him  maintain his aspirin, nitroGLYCERIN, Accu-Chek Guide, glucose blood, clopidogrel, metoprolol tartrate, rosuvastatin, omega-3 acid ethyl esters, Invokana, and olmesartan.  Meds ordered this encounter  Medications  . Semaglutide (RYBELSUS) 3 MG TABS    Sig: Take 1 tablet by mouth daily.    Dispense:  30 tablet    Refill:  0  . metFORMIN (GLUCOPHAGE XR) 750 MG 24 hr tablet    Sig: Take 2 tablets (1,500 mg total) by mouth daily with breakfast.    Dispense:  180 tablet    Refill:  1   In addition to time spent on CPE, I spent 50 minutes in preparing to see the patient by review of recent labs, imaging and procedures, obtaining and reviewing separately obtained history, communicating with the patient and family or caregiver, ordering medications, tests or procedures, and documenting clinical information in the EHR including the differential Dx, treatment, and any further evaluation and other management of 1. Type II diabetes mellitus with manifestations ([place]) 2. Pure hyperglyceridemia 3. Hyperlipidemia LDL goal <70 4. Microalbuminuria due to type 2 diabetes mellitus ([place]) 5. Essential hypertension   Follow-up: Return in about 4 months (around 09/03/2020).  Scarlette Calico, MD

## 2020-05-07 ENCOUNTER — Telehealth: Payer: Self-pay | Admitting: Internal Medicine

## 2020-05-07 ENCOUNTER — Encounter: Payer: Self-pay | Admitting: Internal Medicine

## 2020-05-07 LAB — LIPID PANEL
Cholesterol: 129 mg/dL (ref 0–200)
HDL: 33.8 mg/dL — ABNORMAL LOW (ref >39.00)
NonHDL: 95.69
Total CHOL/HDL Ratio: 4
Triglycerides: 396 mg/dL — ABNORMAL HIGH (ref 0.0–149.0)
VLDL: 79.2 mg/dL — ABNORMAL HIGH (ref 0.0–40.0)

## 2020-05-07 LAB — CBC WITH DIFFERENTIAL/PLATELET
Basophils Absolute: 0.1 10*3/uL (ref 0.0–0.1)
Basophils Relative: 0.7 % (ref 0.0–3.0)
Eosinophils Absolute: 0.2 10*3/uL (ref 0.0–0.7)
Eosinophils Relative: 2.6 % (ref 0.0–5.0)
HCT: 45.5 % (ref 39.0–52.0)
Hemoglobin: 15.2 g/dL (ref 13.0–17.0)
Lymphocytes Relative: 34.4 % (ref 12.0–46.0)
Lymphs Abs: 2.9 10*3/uL (ref 0.7–4.0)
MCHC: 33.4 g/dL (ref 30.0–36.0)
MCV: 79.2 fl (ref 78.0–100.0)
Monocytes Absolute: 0.7 10*3/uL (ref 0.1–1.0)
Monocytes Relative: 7.9 % (ref 3.0–12.0)
Neutro Abs: 4.5 10*3/uL (ref 1.4–7.7)
Neutrophils Relative %: 54.4 % (ref 43.0–77.0)
Platelets: 209 10*3/uL (ref 150.0–400.0)
RBC: 5.74 Mil/uL (ref 4.22–5.81)
RDW: 14.9 % (ref 11.5–15.5)
WBC: 8.3 10*3/uL (ref 4.0–10.5)

## 2020-05-07 LAB — HEPATIC FUNCTION PANEL
ALT: 20 U/L (ref 0–53)
AST: 18 U/L (ref 0–37)
Albumin: 4.9 g/dL (ref 3.5–5.2)
Alkaline Phosphatase: 47 U/L (ref 39–117)
Bilirubin, Direct: 0.1 mg/dL (ref 0.0–0.3)
Total Bilirubin: 0.7 mg/dL (ref 0.2–1.2)
Total Protein: 7.7 g/dL (ref 6.0–8.3)

## 2020-05-07 LAB — HEMOGLOBIN A1C: Hgb A1c MFr Bld: 7.7 % — ABNORMAL HIGH (ref 4.6–6.5)

## 2020-05-07 LAB — URINALYSIS, ROUTINE W REFLEX MICROSCOPIC
Bilirubin Urine: NEGATIVE
Hgb urine dipstick: NEGATIVE
Ketones, ur: NEGATIVE
Leukocytes,Ua: NEGATIVE
Nitrite: NEGATIVE
RBC / HPF: NONE SEEN (ref 0–?)
Specific Gravity, Urine: 1.02 (ref 1.000–1.030)
Total Protein, Urine: NEGATIVE
Urine Glucose: >=1000 — AB
Urobilinogen, UA: 0.2 (ref 0.0–1.0)
pH: 6 (ref 5.0–8.0)

## 2020-05-07 LAB — MICROALBUMIN / CREATININE URINE RATIO
Creatinine,U: 45.7 mg/dL
Microalb Creat Ratio: 18.8 mg/g (ref 0.0–30.0)
Microalb, Ur: 8.6 mg/dL — ABNORMAL HIGH (ref 0.0–1.9)

## 2020-05-07 LAB — BASIC METABOLIC PANEL
BUN: 13 mg/dL (ref 6–23)
CO2: 28 mEq/L (ref 19–32)
Calcium: 10.5 mg/dL (ref 8.4–10.5)
Chloride: 100 mEq/L (ref 96–112)
Creatinine, Ser: 0.75 mg/dL (ref 0.40–1.50)
GFR: 100.55 mL/min (ref >60.00)
Glucose, Bld: 132 mg/dL — ABNORMAL HIGH (ref 70–99)
Potassium: 3.8 mEq/L (ref 3.5–5.1)
Sodium: 137 mEq/L (ref 135–145)

## 2020-05-07 LAB — LDL CHOLESTEROL, DIRECT: Direct LDL: 58 mg/dL

## 2020-05-07 LAB — PSA: PSA: 0.34 ng/mL (ref 0.10–4.00)

## 2020-05-07 MED ORDER — RYBELSUS 3 MG PO TABS: 1.0000 | ORAL_TABLET | Freq: Every day | ORAL | 0 refills | Status: DC

## 2020-05-07 MED ORDER — METFORMIN HCL ER 750 MG PO TB24: 1500.0000 mg | ORAL_TABLET | Freq: Every day | ORAL | 1 refills | Status: DC

## 2020-05-07 NOTE — Telephone Encounter (Signed)
Patient calling back to get his results. [phone]

## 2020-05-07 NOTE — Telephone Encounter (Signed)
See result note.  

## 2020-05-07 NOTE — Telephone Encounter (Signed)
   Spouse calling to discuss lab results, she states the patient did not understand results and recommendation

## 2020-05-08 NOTE — Telephone Encounter (Signed)
Called pt, LVM.   

## 2020-05-08 NOTE — Telephone Encounter (Signed)
Patients wife calling back stating she has been working and cant have her phone out at work, she said she can be reached between 3p-5p to discuss results.

## 2020-05-09 ENCOUNTER — Encounter: Payer: Self-pay | Admitting: Internal Medicine

## 2020-05-09 NOTE — Telephone Encounter (Signed)
I spoke to pt's wife, [place] Sink, informing her of lab results. She expressed concern that she may not be able to afford the new medication Rybelsus like she was not able to afford a previous rx'd medication. I informed her that she would have to check with her pharmacy in regard to pricing as we do not have access to that information here in the office.

## 2020-05-09 NOTE — Telephone Encounter (Signed)
Patients wife calling back upset because no one has returned her call, concerned because her husband didn't understand what he needed to do and if he needed to change or stop medications. [phone]

## 2020-05-21 ENCOUNTER — Other Ambulatory Visit: Payer: Self-pay | Admitting: Internal Medicine

## 2020-05-21 DIAGNOSIS — E118 Type 2 diabetes mellitus with unspecified complications: Secondary | ICD-10-CM

## 2020-06-04 ENCOUNTER — Other Ambulatory Visit: Payer: Self-pay | Admitting: Internal Medicine

## 2020-06-04 DIAGNOSIS — E781 Pure hyperglyceridemia: Secondary | ICD-10-CM

## 2020-06-17 ENCOUNTER — Telehealth: Payer: Self-pay | Admitting: Internal Medicine

## 2020-06-17 NOTE — Telephone Encounter (Signed)
    Spouse calling to report INVOKANA 300 MG TABS tablet will no longer be covered by insurance in January Alternatives that are covered Manuella Ghazi, Jardiance  Please advise

## 2020-06-18 ENCOUNTER — Other Ambulatory Visit: Payer: Self-pay | Admitting: Internal Medicine

## 2020-06-18 DIAGNOSIS — E118 Type 2 diabetes mellitus with unspecified complications: Secondary | ICD-10-CM

## 2020-06-18 MED ORDER — STEGLATRO 15 MG PO TABS: 15.0000 mg | ORAL_TABLET | Freq: Every day | ORAL | 1 refills | Status: DC

## 2020-06-19 ENCOUNTER — Other Ambulatory Visit: Payer: Self-pay | Admitting: Internal Medicine

## 2020-06-19 ENCOUNTER — Other Ambulatory Visit: Payer: Self-pay | Admitting: Cardiovascular Disease

## 2020-06-19 DIAGNOSIS — E118 Type 2 diabetes mellitus with unspecified complications: Secondary | ICD-10-CM

## 2020-06-19 DIAGNOSIS — E781 Pure hyperglyceridemia: Secondary | ICD-10-CM

## 2020-06-19 DIAGNOSIS — I10 Essential (primary) hypertension: Secondary | ICD-10-CM

## 2020-06-21 ENCOUNTER — Other Ambulatory Visit: Payer: Self-pay | Admitting: Family

## 2020-06-21 DIAGNOSIS — E118 Type 2 diabetes mellitus with unspecified complications: Secondary | ICD-10-CM

## 2020-07-11 ENCOUNTER — Other Ambulatory Visit: Payer: Self-pay | Admitting: Internal Medicine

## 2020-07-11 DIAGNOSIS — E118 Type 2 diabetes mellitus with unspecified complications: Secondary | ICD-10-CM

## 2020-07-11 MED ORDER — RYBELSUS 3 MG PO TABS: 1.0000 | ORAL_TABLET | Freq: Every day | ORAL | 0 refills | Status: DC

## 2020-07-15 ENCOUNTER — Other Ambulatory Visit: Payer: Self-pay | Admitting: Physician Assistant

## 2020-07-16 ENCOUNTER — Other Ambulatory Visit: Payer: Self-pay | Admitting: Cardiovascular Disease

## 2020-07-16 DIAGNOSIS — E781 Pure hyperglyceridemia: Secondary | ICD-10-CM

## 2020-07-16 DIAGNOSIS — I10 Essential (primary) hypertension: Secondary | ICD-10-CM

## 2020-07-29 ENCOUNTER — Other Ambulatory Visit: Payer: Self-pay | Admitting: Physician Assistant

## 2020-07-29 ENCOUNTER — Other Ambulatory Visit: Payer: Self-pay | Admitting: Cardiovascular Disease

## 2020-07-29 DIAGNOSIS — I10 Essential (primary) hypertension: Secondary | ICD-10-CM

## 2020-07-29 DIAGNOSIS — E781 Pure hyperglyceridemia: Secondary | ICD-10-CM

## 2020-08-14 ENCOUNTER — Encounter: Payer: Self-pay | Admitting: Internal Medicine

## 2020-08-14 ENCOUNTER — Ambulatory Visit: Payer: 59 | Admitting: Internal Medicine

## 2020-08-14 ENCOUNTER — Other Ambulatory Visit: Payer: Self-pay

## 2020-08-14 VITALS — BP 122/78 | HR 72 | Temp 98.2°F | Resp 16 | Ht 65.0 in | Wt 191.0 lb

## 2020-08-14 DIAGNOSIS — E781 Pure hyperglyceridemia: Secondary | ICD-10-CM | POA: Diagnosis not present

## 2020-08-14 DIAGNOSIS — I10 Essential (primary) hypertension: Secondary | ICD-10-CM | POA: Diagnosis not present

## 2020-08-14 DIAGNOSIS — E118 Type 2 diabetes mellitus with unspecified complications: Secondary | ICD-10-CM

## 2020-08-14 NOTE — Patient Instructions (Signed)
Type 2 Diabetes Mellitus, Diagnosis, Adult Type 2 diabetes (type 2 diabetes mellitus) is a long-term, or chronic, disease. In type 2 diabetes, one or both of these problems may be present:  The pancreas does not make enough of a hormone called insulin.  Cells in the body do not respond properly to insulin that the body makes (insulin resistance). Normally, insulin allows blood sugar (glucose) to enter cells in the body. The cells use glucose for energy. Insulin resistance or lack of insulin causes excess glucose to build up in the blood instead of going into cells. This causes high blood glucose (hyperglycemia).  What are the causes? The exact cause of type 2 diabetes is not known. What increases the risk? The following factors may make you more likely to develop this condition:  Having a family member with type 2 diabetes.  Being overweight or obese.  Being inactive (sedentary).  Having been diagnosed with insulin resistance.  Having a history of prediabetes, diabetes when you were pregnant (gestational diabetes), or polycystic ovary syndrome (PCOS). What are the signs or symptoms? In the early stage of this condition, you may not have symptoms. Symptoms develop slowly and may include:  Increased thirst or hunger.  Increased urination.  Unexplained weight loss.  Tiredness (fatigue) or weakness.  Vision changes, such as blurry vision.  Dark patches on the skin. How is this diagnosed? This condition is diagnosed based on your symptoms, your medical history, a physical exam, and your blood glucose level. Your blood glucose may be checked with one or more of the following blood tests:  A fasting blood glucose (FBG) test. You will not be allowed to eat (you will fast) for 8 hours or longer before a blood sample is taken.  A random blood glucose test. This test checks blood glucose at any time of day regardless of when you ate.  An A1C (hemoglobin A1C) blood test. This test  provides information about blood glucose levels over the previous 2-3 months.  An oral glucose tolerance test (OGTT). This test measures your blood glucose at two times: ? After fasting. This is your baseline blood glucose level. ? Two hours after drinking a beverage that contains glucose. You may be diagnosed with type 2 diabetes if:  Your fasting blood glucose level is 126 mg/dL (7.0 mmol/L) or higher.  Your random blood glucose level is 200 mg/dL (11.1 mmol/L) or higher.  Your A1C level is 6.5% or higher.  Your oral glucose tolerance test result is higher than 200 mg/dL (11.1 mmol/L). These blood tests may be repeated to confirm your diagnosis.   How is this treated? Your treatment may be managed by a specialist called an endocrinologist. Type 2 diabetes may be treated by following instructions from your health care provider about:  Making dietary and lifestyle changes. These may include: ? Following a personalized nutrition plan that is developed by a registered dietitian. ? Exercising regularly. ? Finding ways to manage stress.  Checking your blood glucose level as often as told.  Taking diabetes medicines or insulin daily. This helps to keep your blood glucose levels in the healthy range.  Taking medicines to help prevent complications from diabetes. Medicines may include: ? Aspirin. ? Medicine to lower cholesterol. ? Medicine to control blood pressure. Your health care provider will set treatment goals for you. Your goals will be based on your age, other medical conditions you have, and how you respond to diabetes treatment. Generally, the goal of treatment is to maintain the   following blood glucose levels:  Before meals: 80-130 mg/dL (4.4-7.2 mmol/L).  After meals: below 180 mg/dL (10 mmol/L).  A1C level: less than 7%. Follow these instructions at home: Questions to ask your health care provider Consider asking the following questions:  Should I meet with a certified  diabetes care and education specialist?  What diabetes medicines do I need, and when should I take them?  What equipment will I need to manage my diabetes at home?  How often do I need to check my blood glucose?  Where can I find a support group for people with diabetes?  What number can I call if I have questions?  When is my next appointment? General instructions  Take over-the-counter and prescription medicines only as told by your health care provider.  Keep all follow-up visits as told by your health care provider. This is important. Where to find more information  American Diabetes Association (ADA): www.diabetes.org  American Association of Diabetes Care and Education Specialists (ADCES): www.diabeteseducator.org  International Diabetes Federation (IDF): www.idf.org Contact a health care provider if:  Your blood glucose is at or above 240 mg/dL (13.3 mmol/L) for 2 days in a row.  You have been sick or have had a fever for 2 days or longer, and you are not getting better.  You have any of the following problems for more than 6 hours: ? You cannot eat or drink. ? You have nausea and vomiting. ? You have diarrhea. Get help right away if:  You have severe hypoglycemia. This means your blood glucose is lower than 54 mg/dL (3.0 mmol/L).  You become confused or you have trouble thinking clearly.  You have difficulty breathing.  You have moderate or large ketone levels in your urine. These symptoms may represent a serious problem that is an emergency. Do not wait to see if the symptoms will go away. Get medical help right away. Call your local emergency services (911 in the [place] Do not drive yourself to the hospital. Summary  Type 2 diabetes (type 2 diabetes mellitus) is a long-term, or chronic, disease. In type 2 diabetes, the pancreas does not make enough of a hormone called insulin, or cells in the body do not respond properly to insulin that the body makes (insulin  resistance).  This condition is treated by making dietary and lifestyle changes and taking diabetes medicines or insulin.  Your health care provider will set treatment goals for you. Your goals will be based on your age, other medical conditions you have, and how you respond to diabetes treatment.  Keep all follow-up visits as told by your health care provider. This is important. This information is not intended to replace advice given to you by your health care provider. Make sure you discuss any questions you have with your health care provider. Document Revised: 01/02/2020 Document Reviewed: 01/02/2020 Elsevier Patient Education  2021 [employer] Inc.  

## 2020-08-14 NOTE — Progress Notes (Signed)
Subjective:  Patient ID: [name], male    DOB: 11/21/1960  Age: 60 y.o. MRN: [id number]  CC: Diabetes, Hyperlipidemia, and Hypertension  This visit occurred during the SARS-CoV-2 public health emergency.  Safety protocols were in place, including screening questions prior to the visit, additional usage of staff PPE, and extensive cleaning of exam room while observing appropriate contact time as indicated for disinfecting solutions.    HPI [name] presents for f/up - He is not been exercising much.  He is otherwise active and denies any episodes of CP, DOE, palpitations, edema, or fatigue.  He denies dizziness, lightheadedness, or polys.  Outpatient Medications Prior to Visit  Medication Sig Dispense Refill  . aspirin 81 MG tablet Take 81 mg by mouth daily.    . Blood Glucose Monitoring Suppl (ACCU-CHEK GUIDE) w/Device KIT 1 Act by Does not apply route 2 (two) times daily. 2 kit 0  . clopidogrel (PLAVIX) 75 MG tablet TAKE 1 TABLET BY MOUTH EVERY DAY 90 tablet 3  . ertugliflozin L-PyroglutamicAc (STEGLATRO) 15 MG TABS tablet Take 1 tablet (15 mg total) by mouth daily before breakfast. 90 tablet 1  . glucose blood (ACCU-CHEK GUIDE) test strip Use BID 100 each 11  . metFORMIN (GLUCOPHAGE XR) 750 MG 24 hr tablet Take 2 tablets (1,500 mg total) by mouth daily with breakfast. 180 tablet 1  . metoprolol tartrate (LOPRESSOR) 25 MG tablet TAKE 1 TABLET BY MOUTH TWICE A DAY NEEDS APPT FOR REFILLS 2ND ATTEMPT 180 tablet 0  . nitroGLYCERIN (NITROSTAT) 0.4 MG SL tablet Place 1 tablet (0.4 mg total) under the tongue every 5 (five) minutes as needed for chest pain. 25 tablet 3  . olmesartan (BENICAR) 20 MG tablet TAKE 1 TABLET BY MOUTH EVERY DAY 90 tablet 1  . omega-3 acid ethyl esters (LOVAZA) 1 g capsule TAKE 2 CAPSULES (2 G TOTAL) BY MOUTH 2 (TWO) TIMES DAILY. 360 capsule 1  . rosuvastatin (CRESTOR) 40 MG tablet TAKE 1 TABLET BY MOUTH EVERY DAY 90 tablet 0  . Semaglutide (RYBELSUS) 3 MG  TABS Take 1 tablet by mouth daily. 30 tablet 0   No facility-administered medications prior to visit.    ROS Review of Systems  Constitutional: Negative for appetite change, diaphoresis, fatigue and unexpected weight change.  HENT: Negative.   Eyes: Negative.   Respiratory: Negative for cough, chest tightness, shortness of breath and wheezing.   Cardiovascular: Negative for chest pain, palpitations and leg swelling.  Gastrointestinal: Negative for abdominal pain, constipation, diarrhea, nausea and vomiting.  Endocrine: Negative.   Genitourinary: Negative.  Negative for difficulty urinating.  Musculoskeletal: Negative for myalgias.  Skin: Negative.   Neurological: Negative.  Negative for dizziness, weakness and headaches.  Hematological: Negative for adenopathy. Does not bruise/bleed easily.  Psychiatric/Behavioral: Negative.     Objective:  BP 122/78   Pulse 72   Temp 98.2 F (36.8 C) (Oral)   Resp 16   Ht _0  (1.651 m)   Wt 191 lb (86.6 kg)   SpO2 94%   BMI 31.78 kg/m   BP Readings from Last 3 Encounters:  08/14/20 122/78  05/06/20 134/76  11/05/19 130/70    Wt Readings from Last 3 Encounters:  08/14/20 191 lb (86.6 kg)  05/06/20 194 lb (88 kg)  11/05/19 190 lb (86.2 kg)    Physical Exam Vitals reviewed.  Constitutional:      Appearance: Normal appearance.  HENT:     Nose: Nose normal.     Mouth/Throat:  Mouth: Mucous membranes are moist.  Eyes:     General: No scleral icterus.    Conjunctiva/sclera: Conjunctivae normal.  Cardiovascular:     Rate and Rhythm: Normal rate and regular rhythm.     Heart sounds: No murmur heard.   Pulmonary:     Effort: Pulmonary effort is normal.     Breath sounds: No stridor. No wheezing, rhonchi or rales.  Abdominal:     General: Abdomen is protuberant. Bowel sounds are normal. There is no distension.     Palpations: Abdomen is soft. There is no hepatomegaly, splenomegaly or mass.     Tenderness: There is no  abdominal tenderness.  Musculoskeletal:        General: Normal range of motion.     Cervical back: Neck supple.     Right lower leg: No edema.     Left lower leg: No edema.  Lymphadenopathy:     Cervical: No cervical adenopathy.  Skin:    General: Skin is warm and dry.     Coloration: Skin is not pale.  Neurological:     General: No focal deficit present.     Mental Status: He is alert. Mental status is at baseline.  Psychiatric:        Mood and Affect: Mood normal.        Behavior: Behavior normal.     Lab Results  Component Value Date   WBC 8.3 05/06/2020   HGB 15.2 05/06/2020   HCT 45.5 05/06/2020   PLT 209.0 05/06/2020   GLUCOSE 124 (H) 08/14/2020   CHOL 129 05/06/2020   TRIG 348.0 (H) 08/14/2020   HDL 33.80 (L) 05/06/2020   LDLDIRECT 58.0 05/06/2020   LDLCALC 40 05/03/2019   ALT 20 05/06/2020   AST 18 05/06/2020   NA 137 08/14/2020   K 3.8 08/14/2020   CL 99 08/14/2020   CREATININE 0.79 08/14/2020   BUN 16 08/14/2020   CO2 28 08/14/2020   TSH 2.18 05/02/2019   PSA 0.34 05/06/2020   INR 0.9 ratio 04/22/2009   HGBA1C 7.6 (H) 08/14/2020   MICROALBUR 8.6 (H) 05/06/2020    DG Chest 2 View  Result Date: 08/08/2016 CLINICAL DATA:  Initial evaluation for acute right upper quadrant pain status post surgery. EXAM: CHEST  2 VIEW COMPARISON:  Prior radiograph from 07/10/2010. FINDINGS: The cardiac and mediastinal silhouettes are stable in size and contour, and remain within normal limits. Suspected underlying emphysema. No airspace consolidation, pleural effusion, or pulmonary edema is identified. There is no pneumothorax. No acute osseous abnormality identified. IMPRESSION: No active cardiopulmonary disease. Electronically Signed   By: Jeannine Boga M.D.   On: 08/08/2016 00:29   CT Abdomen Pelvis W Contrast  Result Date: 08/07/2016 CLINICAL DATA:  Right upper quadrant abdominal pain radiating to the back for 4 days. History of laparoscopic cholecystectomy and  umbilical hernia repair 1 week ago. EXAM: CT ABDOMEN AND PELVIS WITH CONTRAST TECHNIQUE: Multidetector CT imaging of the abdomen and pelvis was performed using the standard protocol following bolus administration of intravenous contrast. CONTRAST:  169m ISOVUE-300 IOPAMIDOL (ISOVUE-300) INJECTION 61% COMPARISON:  07/12/2016 FINDINGS: Lower chest: Minimal dependent changes in the lung bases. Coronary artery calcifications. Hepatobiliary: Surgical absence of the gallbladder. No bile duct dilatation. No fluid collections or infiltrative changes demonstrated in the gallbladder fossa. Diffuse fatty infiltration of the liver. No focal liver lesions. Pancreas: Unremarkable. No pancreatic ductal dilatation or surrounding inflammatory changes. Spleen: Normal in size without focal abnormality. Adrenals/Urinary Tract: Adrenal glands are  unremarkable. Kidneys are normal, without renal calculi, focal lesion, or hydronephrosis. Bladder is unremarkable. Stomach/Bowel: Stomach is within normal limits. Appendix appears normal. No evidence of bowel wall thickening, distention, or inflammatory changes. Vascular/Lymphatic: Aortic atherosclerosis. No enlarged abdominal or pelvic lymph nodes. Reproductive: Borderline prostate size at 3.8 cm diameter. Prostate calcifications are present. Other: There is focal infiltration in the anterior abdominal wall at the level of the umbilicus, likely representing postoperative change. No loculated fluid collections. No free fluid or free air in the abdomen. Abdominal wall musculature appears intact. Musculoskeletal: Degenerative changes in the spine. No destructive bone lesions. IMPRESSION: Postoperative cholecystectomy. No fluid or infiltration in the gallbladder fossa. No free fluid in the abdomen. Focal infiltration in the anterior abdominal wall around the umbilical region is likely postoperative. No loculated collection to suggest abscess. Diffuse fatty infiltration of the liver. No evidence  of bowel obstruction or inflammation. Electronically Signed   By: Lucienne Capers M.D.   On: 08/07/2016 22:46    Assessment & Plan:   Laurin was seen today for diabetes, hyperlipidemia and hypertension.  Diagnoses and all orders for this visit:  Essential hypertension- His BP is adequately well controlled. -     Basic metabolic panel; Future -     Basic metabolic panel  Pure hyperglyceridemia- Will continue lifestyle modifications and the fish oil. I have also asked him to start basal insulin to activate the LPL. -     Triglycerides; Future -     Triglycerides -     insulin glargine, 2 Unit Dial, (TOUJEO MAX SOLOSTAR) 300 UNIT/ML Solostar Pen; Inject 30 Units into the skin daily. -     Insulin Pen Needle 32G X 6 MM MISC; 1 Act by Does not apply route daily.  Type II diabetes mellitus with manifestations ([place])- His A1C is up to 7.6%, I recommended that add basal insulin to his current regimen. -     Hemoglobin A1c; Future -     Hemoglobin A1c -     insulin glargine, 2 Unit Dial, (TOUJEO MAX SOLOSTAR) 300 UNIT/ML Solostar Pen; Inject 30 Units into the skin daily. -     Insulin Pen Needle 32G X 6 MM MISC; 1 Act by Does not apply route daily.   I am having Fredricka Bonine start on [employer] and Insulin Pen Needle. I am also having him maintain his aspirin, nitroGLYCERIN, Accu-Chek Guide, glucose blood, clopidogrel, metFORMIN, omega-3 acid ethyl esters, Steglatro, olmesartan, Rybelsus, metoprolol tartrate, and rosuvastatin.  Meds ordered this encounter  Medications  . insulin glargine, 2 Unit Dial, (TOUJEO MAX SOLOSTAR) 300 UNIT/ML Solostar Pen    Sig: Inject 30 Units into the skin daily.    Dispense:  9 mL    Refill:  1  . Insulin Pen Needle 32G X 6 MM MISC    Sig: 1 Act by Does not apply route daily.    Dispense:  100 each    Refill:  1     Follow-up: Return in about 6 months (around 02/11/2021).  Scarlette Calico, MD

## 2020-08-15 ENCOUNTER — Telehealth: Payer: Self-pay

## 2020-08-15 LAB — BASIC METABOLIC PANEL
BUN: 16 mg/dL (ref 6–23)
CO2: 28 mEq/L (ref 19–32)
Calcium: 10.1 mg/dL (ref 8.4–10.5)
Chloride: 99 mEq/L (ref 96–112)
Creatinine, Ser: 0.79 mg/dL (ref 0.40–1.50)
GFR: 98.79 mL/min (ref >60.00)
Glucose, Bld: 124 mg/dL — ABNORMAL HIGH (ref 70–99)
Potassium: 3.8 mEq/L (ref 3.5–5.1)
Sodium: 137 mEq/L (ref 135–145)

## 2020-08-15 LAB — TRIGLYCERIDES: Triglycerides: 348 mg/dL — ABNORMAL HIGH (ref 0.0–149.0)

## 2020-08-15 LAB — HEMOGLOBIN A1C: Hgb A1c MFr Bld: 7.6 % — ABNORMAL HIGH (ref 4.6–6.5)

## 2020-08-15 MED ORDER — TOUJEO MAX SOLOSTAR 300 UNIT/ML ~~LOC~~ SOPN: 30.0000 [IU] | PEN_INJECTOR | Freq: Every day | SUBCUTANEOUS | 1 refills | Status: DC

## 2020-08-15 MED ORDER — INSULIN PEN NEEDLE 32G X 6 MM MISC
1.0000 | Freq: Every day | 1 refills | Status: DC
Start: 2020-08-15 — End: 2021-09-11

## 2020-08-15 NOTE — Telephone Encounter (Signed)
-----   Message from Etta Grandchild, MD sent at 08/15/2020  2:08 PM EST ----- Please let him know that his blood sugar and triglycerides are still too high.  I recommend that he start using a once daily dosage of insulin to lower the blood sugar and triglycerides.  I have sent this prescription to his pharmacy.  TJ

## 2020-08-15 NOTE — Telephone Encounter (Signed)
Patients wife called and said that the patient said that he is note doing shots and is wanting to know if there is anything else that can be sent in. Please advise

## 2020-08-15 NOTE — Telephone Encounter (Signed)
Pt's wife called and was informed of the below message from PCP. She stated that the pharmacy has called them informing them new meds were sent but even with a coupon the Toujeo is still not affordable costing $250. She would like an alternative sent to the pharmacy that would be cheaper. I stated that we do not know what the insurance would or would not cover and suggested that it would be easier for her to get in touch with the insurance company to find out what comparable medications they cover that would be cost effective for them. She reiterated that the pharmacy advised her to call PCP but I had to reinforce that we do not know pricing and that is would be slightly like a guessing game sending in different meds but I would let PCP know. She said she would discuss with her husband because she knows he would be against taking insulin saying "he has been doing good and not feeling bad". Please advise.

## 2020-08-18 ENCOUNTER — Other Ambulatory Visit: Payer: Self-pay | Admitting: Internal Medicine

## 2020-08-18 DIAGNOSIS — E118 Type 2 diabetes mellitus with unspecified complications: Secondary | ICD-10-CM

## 2020-08-18 DIAGNOSIS — I2510 Atherosclerotic heart disease of native coronary artery without angina pectoris: Secondary | ICD-10-CM

## 2020-08-18 MED ORDER — RYBELSUS 7 MG PO TABS: 1.0000 | ORAL_TABLET | Freq: Every day | ORAL | 1 refills | Status: DC

## 2020-08-24 ENCOUNTER — Other Ambulatory Visit: Payer: Self-pay | Admitting: Cardiovascular Disease

## 2020-08-24 DIAGNOSIS — I2510 Atherosclerotic heart disease of native coronary artery without angina pectoris: Secondary | ICD-10-CM

## 2020-09-08 NOTE — Progress Notes (Signed)
Cardiology Office Note:    Date:  09/09/2020   ID:  [name], DOB 1960/10/07, MRN [id number]  PCP:  Janith Lima, MD   [place]  Cardiologist:  Sherren Mocha, MD   Electrophysiologist:  None       Referring MD: Janith Lima, MD   Chief Complaint:  Follow-up (Coronary artery disease)    Patient Profile:    [name] is a 60 y.o. male with:   Coronary artery disease   Hx of ACS   S/p multiple PCI/stent procedures   Hx of BMS to RCA, BMS to LCx and DES to RCA (inside BMS)  Long term DAPT  Hypertension   Diabetes mellitus   Hyperlipidemia   OSA   Prior CV studies: Cardiac catheterization 2009/05/25 LM patent LAD distal 40-50; D3 30 LCx proximal 30; distal stent patent with <30 ISR; OM1 irregularity RCA stent with 50 and 90 ISR, distal stent patent; PDA 30  EF > 55 PCI: 2.75 x 33 mm Cypher DES to RCA ISR  History of Present Illness:    Mr. Jourdan was last seen in 04/2019 by Leanor Kail, PA-C.  He returns for follow-up.  He is here alone.  He works in Theatre manager for apartment complexes.  He does a lot of heavy lifting throughout the day.  He is doing well without chest discomfort, shortness of breath, syncope, orthopnea, edema.         Past Medical History:  Diagnosis Date  . Asthma   . Coronary artery disease    s/p BMS to RCA and CFX. s/p DES to RCA 2/2 ISR 2010  . Diabetes mellitus without complication ([place])   . History of kidney stones    20 yrs ago   . Hyperlipidemia   . Hypertension   . Old myocardial infarction   . Paroxysmal ventricular tachycardia ([place])   . Sleep apnea    not using cpap    5-6 yrs     Current Medications: Current Meds  Medication Sig  . aspirin 81 MG tablet Take 81 mg by mouth daily.  . Blood Glucose Monitoring Suppl (ACCU-CHEK GUIDE) w/Device KIT 1 Act by Does not apply route 2 (two) times daily.  . clopidogrel (PLAVIX) 75 MG tablet TAKE 1 TABLET BY MOUTH EVERY DAY  .  ertugliflozin L-PyroglutamicAc (STEGLATRO) 15 MG TABS tablet Take 1 tablet (15 mg total) by mouth daily before breakfast.  . glucose blood (ACCU-CHEK GUIDE) test strip Use BID  . Insulin Pen Needle 32G X 6 MM MISC 1 Act by Does not apply route daily.  . metFORMIN (GLUCOPHAGE XR) 750 MG 24 hr tablet Take 2 tablets (1,500 mg total) by mouth daily with breakfast.  . metoprolol tartrate (LOPRESSOR) 25 MG tablet TAKE 1 TABLET BY MOUTH TWICE A DAY NEEDS APPT FOR REFILLS 2ND ATTEMPT  . olmesartan (BENICAR) 20 MG tablet TAKE 1 TABLET BY MOUTH EVERY DAY  . omega-3 acid ethyl esters (LOVAZA) 1 g capsule TAKE 2 CAPSULES (2 G TOTAL) BY MOUTH 2 (TWO) TIMES DAILY.  . rosuvastatin (CRESTOR) 40 MG tablet TAKE 1 TABLET BY MOUTH EVERY DAY  . Semaglutide (RYBELSUS) 7 MG TABS Take 1 tablet by mouth daily.  . [DISCONTINUED] nitroGLYCERIN (NITROSTAT) 0.4 MG SL tablet Place 1 tablet (0.4 mg total) under the tongue every 5 (five) minutes as needed for chest pain.     Allergies:   Heparin   Social History   Tobacco Use  . Smoking status: Former  Smoker    Packs/day: 1.50    Years: 30.00    Pack years: 45.00    Types: Cigarettes    Quit date: 08/22/2011    Years since quitting: 9.0  . Smokeless tobacco: Never Used  . Tobacco comment: currently smoking 1 ppd.   Vaping Use  . Vaping Use: Never used  Substance Use Topics  . Alcohol use: No  . Drug use: No     Family Hx: The patient's family history includes Alzheimer's disease in his mother; Diabetes in his mother and sister; Heart disease in his brother, mother, and sister; Kidney disease in his sister; Peripheral vascular disease in his sister. There is no history of Cancer, Alcohol abuse, Early death, Hyperlipidemia, Hypertension, or Stroke.  ROS   EKGs/Labs/Other Test Reviewed:    EKG:  EKG is   ordered today.  The ekg ordered today demonstrates Normal sinus rhythm, heart rate 70, normal axis, nonspecific ST-T wave changes, QTC 425    Recent  Labs: 05/06/2020: ALT 20; Hemoglobin 15.2; Platelets 209.0 08/14/2020: BUN 16; Creatinine, Ser 0.79; Potassium 3.8; Sodium 137   Recent Lipid Panel Lab Results  Component Value Date/Time   CHOL 129 05/06/2020 04:40 PM   CHOL 116 05/03/2019 07:46 AM   CHOL 219 08/20/2012 12:00 AM   TRIG 348.0 (H) 08/14/2020 04:23 PM   TRIG 402 08/20/2012 12:00 AM   HDL 33.80 (L) 05/06/2020 04:40 PM   HDL 30 (L) 05/03/2019 07:46 AM   CHOLHDL 4 05/06/2020 04:40 PM   LDLCALC 40 05/03/2019 07:46 AM   LDLCALC 107 08/20/2012 12:00 AM   LDLDIRECT 58.0 05/06/2020 04:40 PM      Risk Assessment/Calculations:      Physical Exam:    VS:  BP 100/64   Pulse 70   Ht 5' 6"  (1.676 m)   Wt 192 lb 9.6 oz (87.4 kg)   SpO2 95%   BMI 31.09 kg/m     Wt Readings from Last 3 Encounters:  09/09/20 192 lb 9.6 oz (87.4 kg)  08/14/20 191 lb (86.6 kg)  05/06/20 194 lb (88 kg)     Constitutional:      Appearance: Healthy appearance. Not in distress.  Neck:     Vascular: No carotid bruit. JVD normal.  Pulmonary:     Effort: Pulmonary effort is normal.     Breath sounds: No wheezing. No rales.  Cardiovascular:     Normal rate. Regular rhythm. Normal S1. Normal S2.     Murmurs: There is no murmur.  Edema:    Peripheral edema absent.  Abdominal:     Palpations: Abdomen is soft. There is no hepatomegaly.  Skin:    General: Skin is warm and dry.  Neurological:     General: No focal deficit present.     Mental Status: Alert and oriented to person, place and time.     Cranial Nerves: Cranial nerves are intact.       ASSESSMENT & PLAN:    1. Coronary artery disease involving native coronary artery of native heart without angina pectoris Prior history of acute coronary syndrome and multiple PCI procedures.  His last PCI was in 2010 when he was treated with a DES to the RCA secondary to in-stent restenosis.  He is doing well without anginal symptoms.  He remains on dual antiplatelet therapy due to multiple  stents.  Continue aspirin, clopidogrel, metoprolol tartrate, rosuvastatin.  Follow-up in 1 year.  2. Essential hypertension The patient's blood pressure is controlled on  his current regimen.  Continue current therapy.   3. Mixed hyperlipidemia Most recent LDL optimal.  His triglycerides are significantly elevated.  His PCP has adjusted his medical therapy with Lov[place].  Continue current dose of rosuvastatin.         Dispo:  Return in about 1 year (around 09/09/2021) for Routine Follow Up, w/ Dr. Burt Knack, or Richardson Dopp, PA-C, in person.   Medication Adjustments/Labs and Tests Ordered: Current medicines are reviewed at length with the patient today.  Concerns regarding medicines are outlined above.  Tests Ordered: Orders Placed This Encounter  Procedures  . EKG 12-Lead   Medication Changes: Meds ordered this encounter  Medications  . nitroGLYCERIN (NITROSTAT) 0.4 MG SL tablet    Sig: Place 1 tablet (0.4 mg total) under the tongue every 5 (five) minutes as needed for chest pain.    Dispense:  25 tablet    Refill:  3    Signed, Richardson Dopp, PA-C  09/09/2020 8:34 AM    [facility] [address], [place], [place]  [address] Phone: [phone]; Fax: [phone]

## 2020-09-09 ENCOUNTER — Ambulatory Visit: Payer: 59 | Admitting: Physician Assistant

## 2020-09-09 ENCOUNTER — Encounter: Payer: Self-pay | Admitting: Physician Assistant

## 2020-09-09 ENCOUNTER — Other Ambulatory Visit: Payer: Self-pay

## 2020-09-09 VITALS — BP 100/64 | HR 70 | Ht 66.0 in | Wt 192.6 lb

## 2020-09-09 DIAGNOSIS — I2510 Atherosclerotic heart disease of native coronary artery without angina pectoris: Secondary | ICD-10-CM

## 2020-09-09 DIAGNOSIS — E782 Mixed hyperlipidemia: Secondary | ICD-10-CM | POA: Diagnosis not present

## 2020-09-09 DIAGNOSIS — I10 Essential (primary) hypertension: Secondary | ICD-10-CM

## 2020-09-09 MED ORDER — NITROGLYCERIN 0.4 MG SL SUBL: 0.4000 mg | SUBLINGUAL_TABLET | SUBLINGUAL | 3 refills | Status: DC | PRN

## 2020-09-09 NOTE — Patient Instructions (Signed)
Medication Instructions:  Continue your current medications.   *If you need a refill on your cardiac medications before your next appointment, please call your pharmacy*   Follow-Up: At [facility], you and your health needs are our priority.  As part of our continuing mission to provide you with exceptional heart care, we have created designated Provider Care Teams.  These Care Teams include your primary Cardiologist (physician) and Advanced Practice Providers (APPs -  Physician Assistants and Nurse Practitioners) who all work together to provide you with the care you need, when you need it.  We recommend signing up for the patient portal called "MyChart".  Sign up information is provided on this After Visit Summary.  MyChart is used to connect with patients for Virtual Visits (Telemedicine).  Patients are able to view lab/test results, encounter notes, upcoming appointments, etc.  Non-urgent messages can be sent to your provider as well.   To learn more about what you can do with MyChart, go to [email or web address].    Your next appointment:   12 month(s)  The format for your next appointment:   In Person  Provider:   Tonny Bollman, MD or [name], PA-C   Other Instructions

## 2020-09-18 ENCOUNTER — Other Ambulatory Visit: Payer: Self-pay | Admitting: Cardiovascular Disease

## 2020-09-18 DIAGNOSIS — I2510 Atherosclerotic heart disease of native coronary artery without angina pectoris: Secondary | ICD-10-CM

## 2020-09-25 ENCOUNTER — Telehealth: Payer: Self-pay

## 2020-09-25 NOTE — Telephone Encounter (Signed)
Key: [id number]  Approved 08/26/2020-09/25/2023

## 2020-10-23 ENCOUNTER — Other Ambulatory Visit: Payer: Self-pay | Admitting: Physician Assistant

## 2020-10-26 ENCOUNTER — Other Ambulatory Visit: Payer: Self-pay | Admitting: Cardiovascular Disease

## 2020-10-26 DIAGNOSIS — I10 Essential (primary) hypertension: Secondary | ICD-10-CM

## 2020-10-26 DIAGNOSIS — E781 Pure hyperglyceridemia: Secondary | ICD-10-CM

## 2020-11-11 ENCOUNTER — Other Ambulatory Visit: Payer: Self-pay | Admitting: Internal Medicine

## 2020-11-11 DIAGNOSIS — E118 Type 2 diabetes mellitus with unspecified complications: Secondary | ICD-10-CM

## 2020-11-26 ENCOUNTER — Other Ambulatory Visit: Payer: Self-pay | Admitting: Cardiovascular Disease

## 2020-11-26 DIAGNOSIS — I10 Essential (primary) hypertension: Secondary | ICD-10-CM

## 2020-11-26 DIAGNOSIS — E781 Pure hyperglyceridemia: Secondary | ICD-10-CM

## 2020-12-16 ENCOUNTER — Other Ambulatory Visit: Payer: Self-pay | Admitting: Internal Medicine

## 2020-12-16 DIAGNOSIS — E118 Type 2 diabetes mellitus with unspecified complications: Secondary | ICD-10-CM

## 2020-12-23 ENCOUNTER — Telehealth: Payer: Self-pay | Admitting: Physician Assistant

## 2020-12-23 DIAGNOSIS — I10 Essential (primary) hypertension: Secondary | ICD-10-CM

## 2020-12-23 DIAGNOSIS — I2510 Atherosclerotic heart disease of native coronary artery without angina pectoris: Secondary | ICD-10-CM

## 2020-12-23 DIAGNOSIS — E781 Pure hyperglyceridemia: Secondary | ICD-10-CM

## 2020-12-23 MED ORDER — METOPROLOL TARTRATE 25 MG PO TABS: 25.0000 mg | ORAL_TABLET | Freq: Two times a day (BID) | ORAL | 2 refills | Status: DC

## 2020-12-23 MED ORDER — ROSUVASTATIN CALCIUM 40 MG PO TABS: 40.0000 mg | ORAL_TABLET | Freq: Every day | ORAL | 2 refills | Status: DC

## 2020-12-23 MED ORDER — CLOPIDOGREL BISULFATE 75 MG PO TABS: 75.0000 mg | ORAL_TABLET | Freq: Every day | ORAL | 2 refills | Status: DC

## 2020-12-23 NOTE — Telephone Encounter (Signed)
PT is calling his medication is saying he needs a appt but he was just seen in September 09, 2020

## 2020-12-23 NOTE — Telephone Encounter (Signed)
Called back to speak with pt regarding his need for an appt.  Wife, Toniann Fail answered the phone and stated he was not there but pt needed refills for his Metoprolol tartrate, clopidogrel and rosuvastatin.  She reports the bottles say no more refills until the pt has an appointment.  Pt was seen 3/22 and is not to f/u for 1 year.  Refills sent into [facility] [address] as requested.  Toniann Fail denied any further needs at this time.

## 2021-01-20 ENCOUNTER — Other Ambulatory Visit: Payer: Self-pay | Admitting: Internal Medicine

## 2021-01-20 DIAGNOSIS — E781 Pure hyperglyceridemia: Secondary | ICD-10-CM

## 2021-02-07 ENCOUNTER — Other Ambulatory Visit: Payer: Self-pay | Admitting: Internal Medicine

## 2021-02-07 DIAGNOSIS — E118 Type 2 diabetes mellitus with unspecified complications: Secondary | ICD-10-CM

## 2021-02-12 ENCOUNTER — Encounter: Payer: Self-pay | Admitting: Internal Medicine

## 2021-02-12 ENCOUNTER — Ambulatory Visit: Payer: 59 | Admitting: Internal Medicine

## 2021-02-12 ENCOUNTER — Other Ambulatory Visit: Payer: Self-pay

## 2021-02-12 VITALS — BP 128/74 | HR 71 | Temp 98.2°F | Resp 16 | Ht 66.0 in | Wt 188.4 lb

## 2021-02-12 DIAGNOSIS — E118 Type 2 diabetes mellitus with unspecified complications: Secondary | ICD-10-CM

## 2021-02-12 DIAGNOSIS — E781 Pure hyperglyceridemia: Secondary | ICD-10-CM

## 2021-02-12 DIAGNOSIS — I10 Essential (primary) hypertension: Secondary | ICD-10-CM

## 2021-02-12 LAB — CBC WITH DIFFERENTIAL/PLATELET
Basophils Absolute: 0 10*3/uL (ref 0.0–0.1)
Basophils Relative: 0.5 % (ref 0.0–3.0)
Eosinophils Absolute: 0.2 10*3/uL (ref 0.0–0.7)
Eosinophils Relative: 2.7 % (ref 0.0–5.0)
HCT: 44.5 % (ref 39.0–52.0)
Hemoglobin: 15.2 g/dL (ref 13.0–17.0)
Lymphocytes Relative: 30.2 % (ref 12.0–46.0)
Lymphs Abs: 2.1 10*3/uL (ref 0.7–4.0)
MCHC: 34 g/dL (ref 30.0–36.0)
MCV: 79 fl (ref 78.0–100.0)
Monocytes Absolute: 0.5 10*3/uL (ref 0.1–1.0)
Monocytes Relative: 7.7 % (ref 3.0–12.0)
Neutro Abs: 4.1 10*3/uL (ref 1.4–7.7)
Neutrophils Relative %: 58.9 % (ref 43.0–77.0)
Platelets: 181 10*3/uL (ref 150.0–400.0)
RBC: 5.64 Mil/uL (ref 4.22–5.81)
RDW: 14.9 % (ref 11.5–15.5)
WBC: 6.9 10*3/uL (ref 4.0–10.5)

## 2021-02-12 LAB — BASIC METABOLIC PANEL
BUN: 12 mg/dL (ref 6–23)
CO2: 25 mEq/L (ref 19–32)
Calcium: 10 mg/dL (ref 8.4–10.5)
Chloride: 103 mEq/L (ref 96–112)
Creatinine, Ser: 0.74 mg/dL (ref 0.40–1.50)
GFR: 100.41 mL/min (ref >60.00)
Glucose, Bld: 179 mg/dL — ABNORMAL HIGH (ref 70–99)
Potassium: 4 mEq/L (ref 3.5–5.1)
Sodium: 139 mEq/L (ref 135–145)

## 2021-02-12 LAB — TRIGLYCERIDES: Triglycerides: 347 mg/dL — ABNORMAL HIGH (ref 0.0–149.0)

## 2021-02-12 LAB — HEMOGLOBIN A1C: Hgb A1c MFr Bld: 8 % — ABNORMAL HIGH (ref 4.6–6.5)

## 2021-02-12 MED ORDER — RYBELSUS 14 MG PO TABS: 14.0000 mg | ORAL_TABLET | Freq: Every day | ORAL | 1 refills | Status: DC

## 2021-02-12 NOTE — Progress Notes (Signed)
Subjective:  Patient ID: [name], male    DOB: 1960/07/28  Age: 60 y.o. MRN: [id number]  CC: Hyperlipidemia and Diabetes  This visit occurred during the SARS-CoV-2 public health emergency.  Safety protocols were in place, including screening questions prior to the visit, additional usage of staff PPE, and extensive cleaning of exam room while observing appropriate contact time as indicated for disinfecting solutions.    HPI [name] presents for f/up -  He walks 2 miles a day and does not experience chest pain, shortness of breath, diaphoresis, dizziness, or lightheadedness.  Outpatient Medications Prior to Visit  Medication Sig Dispense Refill   aspirin 81 MG tablet Take 81 mg by mouth daily.     Blood Glucose Monitoring Suppl (ACCU-CHEK GUIDE) w/Device KIT 1 Act by Does not apply route 2 (two) times daily. 2 kit 0   clopidogrel (PLAVIX) 75 MG tablet Take 1 tablet (75 mg total) by mouth daily. 90 tablet 2   glucose blood (ACCU-CHEK GUIDE) test strip Use BID 100 each 11   Insulin Pen Needle 32G X 6 MM MISC 1 Act by Does not apply route daily. 100 each 1   metFORMIN (GLUCOPHAGE-XR) 750 MG 24 hr tablet TAKE 2 TABLETS (1,500 MG TOTAL) BY MOUTH DAILY WITH BREAKFAST. 180 tablet 1   metoprolol tartrate (LOPRESSOR) 25 MG tablet Take 1 tablet (25 mg total) by mouth 2 (two) times daily. 180 tablet 2   nitroGLYCERIN (NITROSTAT) 0.4 MG SL tablet Place 1 tablet (0.4 mg total) under the tongue every 5 (five) minutes as needed for chest pain. 25 tablet 3   olmesartan (BENICAR) 20 MG tablet TAKE 1 TABLET BY MOUTH EVERY DAY 90 tablet 0   omega-3 acid ethyl esters (LOVAZA) 1 g capsule TAKE 2 CAPSULES BY MOUTH 2 TIMES DAILY. 360 capsule 1   rosuvastatin (CRESTOR) 40 MG tablet Take 1 tablet (40 mg total) by mouth daily. 90 tablet 2   STEGLATRO 15 MG TABS tablet TAKE 1 TABLET BY MOUTH DAILY BEFORE BREAKFAST. 90 tablet 0   Semaglutide (RYBELSUS) 7 MG TABS Take 1 tablet by mouth daily. 90 tablet  1   No facility-administered medications prior to visit.    ROS Review of Systems  Constitutional:  Negative for diaphoresis and fatigue.  HENT: Negative.    Eyes: Negative.   Respiratory:  Negative for cough, chest tightness, shortness of breath and wheezing.   Cardiovascular:  Negative for chest pain, palpitations and leg swelling.  Gastrointestinal:  Negative for abdominal pain, diarrhea and nausea.  Endocrine: Negative.   Genitourinary: Negative.  Negative for difficulty urinating.  Musculoskeletal: Negative.  Negative for arthralgias and myalgias.  Skin: Negative.  Negative for color change and rash.  Neurological: Negative.  Negative for dizziness and weakness.  Hematological:  Negative for adenopathy. Does not bruise/bleed easily.  Psychiatric/Behavioral: Negative.     Objective:  BP 128/74 (BP Location: Right Arm, Patient Position: Sitting, Cuff Size: Large)   Pulse 71   Temp 98.2 F (36.8 C) (Oral)   Resp 16   Ht _0  (1.676 m)   Wt 188 lb 6.4 oz (85.5 kg)   SpO2 95%   BMI 30.41 kg/m   BP Readings from Last 3 Encounters:  02/12/21 128/74  09/09/20 100/64  08/14/20 122/78    Wt Readings from Last 3 Encounters:  02/12/21 188 lb 6.4 oz (85.5 kg)  09/09/20 192 lb 9.6 oz (87.4 kg)  08/14/20 191 lb (86.6 kg)    Physical Exam Vitals  reviewed.  HENT:     Nose: Nose normal.     Mouth/Throat:     Mouth: Mucous membranes are moist.  Eyes:     Conjunctiva/sclera: Conjunctivae normal.  Cardiovascular:     Rate and Rhythm: Normal rate and regular rhythm.     Heart sounds: No murmur heard. Pulmonary:     Effort: Pulmonary effort is normal.     Breath sounds: No stridor. No wheezing, rhonchi or rales.  Abdominal:     General: Abdomen is flat.     Palpations: There is no mass.     Tenderness: There is no abdominal tenderness. There is no guarding.     Hernia: No hernia is present.  Musculoskeletal:        General: Normal range of motion.     Cervical back:  Neck supple.     Right lower leg: No edema.     Left lower leg: No edema.  Skin:    General: Skin is warm and dry.  Neurological:     General: No focal deficit present.     Mental Status: He is alert.    Lab Results  Component Value Date   WBC 6.9 02/12/2021   HGB 15.2 02/12/2021   HCT 44.5 02/12/2021   PLT 181.0 02/12/2021   GLUCOSE 179 (H) 02/12/2021   CHOL 129 05/06/2020   TRIG 347.0 (H) 02/12/2021   HDL 33.80 (L) 05/06/2020   LDLDIRECT 58.0 05/06/2020   LDLCALC 40 05/03/2019   ALT 20 05/06/2020   AST 18 05/06/2020   NA 139 02/12/2021   K 4.0 02/12/2021   CL 103 02/12/2021   CREATININE 0.74 02/12/2021   BUN 12 02/12/2021   CO2 25 02/12/2021   TSH 2.18 05/02/2019   PSA 0.34 05/06/2020   INR 0.9 ratio 04/22/2009   HGBA1C 8.0 (H) 02/12/2021   MICROALBUR 8.6 (H) 05/06/2020    DG Chest 2 View  Result Date: 08/08/2016 CLINICAL DATA:  Initial evaluation for acute right upper quadrant pain status post surgery. EXAM: CHEST  2 VIEW COMPARISON:  Prior radiograph from 07/10/2010. FINDINGS: The cardiac and mediastinal silhouettes are stable in size and contour, and remain within normal limits. Suspected underlying emphysema. No airspace consolidation, pleural effusion, or pulmonary edema is identified. There is no pneumothorax. No acute osseous abnormality identified. IMPRESSION: No active cardiopulmonary disease. Electronically Signed   By: Jeannine Boga M.D.   On: 08/08/2016 00:29   CT Abdomen Pelvis W Contrast  Result Date: 08/07/2016 CLINICAL DATA:  Right upper quadrant abdominal pain radiating to the back for 4 days. History of laparoscopic cholecystectomy and umbilical hernia repair 1 week ago. EXAM: CT ABDOMEN AND PELVIS WITH CONTRAST TECHNIQUE: Multidetector CT imaging of the abdomen and pelvis was performed using the standard protocol following bolus administration of intravenous contrast. CONTRAST:  169m ISOVUE-300 IOPAMIDOL (ISOVUE-300) INJECTION 61% COMPARISON:   07/12/2016 FINDINGS: Lower chest: Minimal dependent changes in the lung bases. Coronary artery calcifications. Hepatobiliary: Surgical absence of the gallbladder. No bile duct dilatation. No fluid collections or infiltrative changes demonstrated in the gallbladder fossa. Diffuse fatty infiltration of the liver. No focal liver lesions. Pancreas: Unremarkable. No pancreatic ductal dilatation or surrounding inflammatory changes. Spleen: Normal in size without focal abnormality. Adrenals/Urinary Tract: Adrenal glands are unremarkable. Kidneys are normal, without renal calculi, focal lesion, or hydronephrosis. Bladder is unremarkable. Stomach/Bowel: Stomach is within normal limits. Appendix appears normal. No evidence of bowel wall thickening, distention, or inflammatory changes. Vascular/Lymphatic: Aortic atherosclerosis. No enlarged abdominal or  pelvic lymph nodes. Reproductive: Borderline prostate size at 3.8 cm diameter. Prostate calcifications are present. Other: There is focal infiltration in the anterior abdominal wall at the level of the umbilicus, likely representing postoperative change. No loculated fluid collections. No free fluid or free air in the abdomen. Abdominal wall musculature appears intact. Musculoskeletal: Degenerative changes in the spine. No destructive bone lesions. IMPRESSION: Postoperative cholecystectomy. No fluid or infiltration in the gallbladder fossa. No free fluid in the abdomen. Focal infiltration in the anterior abdominal wall around the umbilical region is likely postoperative. No loculated collection to suggest abscess. Diffuse fatty infiltration of the liver. No evidence of bowel obstruction or inflammation. Electronically Signed   By: Lucienne Capers M.D.   On: 08/07/2016 22:46    Assessment & Plan:   Nishant was seen today for hyperlipidemia and diabetes.  Diagnoses and all orders for this visit:  Essential hypertension- His blood pressure is adequately well  controlled. -     CBC with Differential/Platelet; Future -     Basic metabolic panel; Future -     Basic metabolic panel -     CBC with Differential/Platelet  Type II diabetes mellitus with manifestations ([place])- His A1c is up to 8.0%.  Will increase the dose of the GLP-1 agonist. -     Basic metabolic panel; Future -     Hemoglobin A1c; Future -     Hemoglobin A1c -     Basic metabolic panel -     Semaglutide (RYBELSUS) 14 MG TABS; Take 14 mg by mouth daily. -     Ambulatory referral to Ophthalmology  Pure hyperglyceridemia- His triglycerides remain elevated.  I recommended that he continue to work on his lifestyle modifications. -     Triglycerides; Future -     Triglycerides  I have discontinued Arvid Marengo Rybelsus. I am also having him start on Rybelsus. Additionally, I am having him maintain his aspirin, Accu-Chek Guide, glucose blood, Insulin Pen Needle, nitroGLYCERIN, metFORMIN, Steglatro, rosuvastatin, metoprolol tartrate, clopidogrel, omega-3 acid ethyl esters, and olmesartan.  Meds ordered this encounter  Medications   Semaglutide (RYBELSUS) 14 MG TABS    Sig: Take 14 mg by mouth daily.    Dispense:  90 tablet    Refill:  1      Follow-up: Return in about 4 months (around 06/14/2021).  Scarlette Calico, MD

## 2021-02-12 NOTE — Patient Instructions (Signed)

## 2021-02-19 ENCOUNTER — Other Ambulatory Visit: Payer: Self-pay | Admitting: Internal Medicine

## 2021-02-19 DIAGNOSIS — I2510 Atherosclerotic heart disease of native coronary artery without angina pectoris: Secondary | ICD-10-CM

## 2021-02-19 DIAGNOSIS — E118 Type 2 diabetes mellitus with unspecified complications: Secondary | ICD-10-CM

## 2021-02-27 ENCOUNTER — Telehealth: Payer: Self-pay | Admitting: Internal Medicine

## 2021-02-27 NOTE — Telephone Encounter (Signed)
Type of form received Preventative exam form  Form placed in Provider's mailbox  Please notify the patient's wife when complete at [phone]   Things to remember: Front office: If form received in person, remind patient that forms take 7-10 business days CMA should attach charge sheet and put on Supervisor's desk   **also, [name] needs a referral for a colonoscopy

## 2021-03-02 NOTE — Telephone Encounter (Signed)
Form placed on PCPs desk for review and signature.  

## 2021-03-10 NOTE — Telephone Encounter (Signed)
Forms have been completed and faxed back. Pt aware.

## 2021-03-16 ENCOUNTER — Other Ambulatory Visit: Payer: Self-pay | Admitting: Physician Assistant

## 2021-05-04 ENCOUNTER — Other Ambulatory Visit: Payer: Self-pay | Admitting: Internal Medicine

## 2021-05-04 DIAGNOSIS — E118 Type 2 diabetes mellitus with unspecified complications: Secondary | ICD-10-CM

## 2021-09-07 ENCOUNTER — Ambulatory Visit: Payer: 59 | Admitting: Internal Medicine

## 2021-09-07 ENCOUNTER — Other Ambulatory Visit: Payer: Self-pay

## 2021-09-07 ENCOUNTER — Encounter: Payer: Self-pay | Admitting: Internal Medicine

## 2021-09-07 VITALS — BP 132/76 | HR 71 | Temp 98.2°F | Resp 16 | Ht 66.0 in | Wt 185.4 lb

## 2021-09-07 DIAGNOSIS — E785 Hyperlipidemia, unspecified: Secondary | ICD-10-CM

## 2021-09-07 DIAGNOSIS — E781 Pure hyperglyceridemia: Secondary | ICD-10-CM | POA: Diagnosis not present

## 2021-09-07 DIAGNOSIS — E1129 Type 2 diabetes mellitus with other diabetic kidney complication: Secondary | ICD-10-CM

## 2021-09-07 DIAGNOSIS — I10 Essential (primary) hypertension: Secondary | ICD-10-CM | POA: Diagnosis not present

## 2021-09-07 DIAGNOSIS — Z23 Encounter for immunization: Secondary | ICD-10-CM

## 2021-09-07 DIAGNOSIS — Z0000 Encounter for general adult medical examination without abnormal findings: Secondary | ICD-10-CM | POA: Diagnosis not present

## 2021-09-07 DIAGNOSIS — Z125 Encounter for screening for malignant neoplasm of prostate: Secondary | ICD-10-CM | POA: Diagnosis not present

## 2021-09-07 DIAGNOSIS — R3129 Other microscopic hematuria: Secondary | ICD-10-CM | POA: Diagnosis not present

## 2021-09-07 DIAGNOSIS — R809 Proteinuria, unspecified: Secondary | ICD-10-CM | POA: Diagnosis not present

## 2021-09-07 DIAGNOSIS — E559 Vitamin D deficiency, unspecified: Secondary | ICD-10-CM

## 2021-09-07 DIAGNOSIS — E118 Type 2 diabetes mellitus with unspecified complications: Secondary | ICD-10-CM

## 2021-09-07 DIAGNOSIS — Z1211 Encounter for screening for malignant neoplasm of colon: Secondary | ICD-10-CM | POA: Insufficient documentation

## 2021-09-07 LAB — URINALYSIS, ROUTINE W REFLEX MICROSCOPIC
Bilirubin Urine: NEGATIVE
Hgb urine dipstick: NEGATIVE
Ketones, ur: NEGATIVE
Leukocytes,Ua: NEGATIVE
Nitrite: NEGATIVE
RBC / HPF: NONE SEEN (ref 0–?)
Specific Gravity, Urine: 1.015 (ref 1.000–1.030)
Total Protein, Urine: NEGATIVE
Urine Glucose: >=1000 — AB
Urobilinogen, UA: 0.2 (ref 0.0–1.0)
pH: 6 (ref 5.0–8.0)

## 2021-09-07 LAB — CBC WITH DIFFERENTIAL/PLATELET
Basophils Absolute: 0 10*3/uL (ref 0.0–0.1)
Basophils Relative: 0.5 % (ref 0.0–3.0)
Eosinophils Absolute: 0.1 10*3/uL (ref 0.0–0.7)
Eosinophils Relative: 2 % (ref 0.0–5.0)
HCT: 44.4 % (ref 39.0–52.0)
Hemoglobin: 15 g/dL (ref 13.0–17.0)
Lymphocytes Relative: 31.5 % (ref 12.0–46.0)
Lymphs Abs: 2.2 10*3/uL (ref 0.7–4.0)
MCHC: 33.8 g/dL (ref 30.0–36.0)
MCV: 79.9 fl (ref 78.0–100.0)
Monocytes Absolute: 0.5 10*3/uL (ref 0.1–1.0)
Monocytes Relative: 7.4 % (ref 3.0–12.0)
Neutro Abs: 4.1 10*3/uL (ref 1.4–7.7)
Neutrophils Relative %: 58.6 % (ref 43.0–77.0)
Platelets: 210 10*3/uL (ref 150.0–400.0)
RBC: 5.56 Mil/uL (ref 4.22–5.81)
RDW: 15 % (ref 11.5–15.5)
WBC: 7 10*3/uL (ref 4.0–10.5)

## 2021-09-07 LAB — PSA: PSA: 0.39 ng/mL (ref 0.10–4.00)

## 2021-09-07 LAB — BASIC METABOLIC PANEL
BUN: 20 mg/dL (ref 6–23)
CO2: 26 mEq/L (ref 19–32)
Calcium: 9.9 mg/dL (ref 8.4–10.5)
Chloride: 105 mEq/L (ref 96–112)
Creatinine, Ser: 0.75 mg/dL (ref 0.40–1.50)
GFR: 99.61 mL/min (ref >60.00)
Glucose, Bld: 109 mg/dL — ABNORMAL HIGH (ref 70–99)
Potassium: 3.7 mEq/L (ref 3.5–5.1)
Sodium: 141 mEq/L (ref 135–145)

## 2021-09-07 LAB — LIPID PANEL
Cholesterol: 110 mg/dL (ref 0–200)
HDL: 35.3 mg/dL — ABNORMAL LOW (ref >39.00)
NonHDL: 74.6
Total CHOL/HDL Ratio: 3
Triglycerides: 254 mg/dL — ABNORMAL HIGH (ref 0.0–149.0)
VLDL: 50.8 mg/dL — ABNORMAL HIGH (ref 0.0–40.0)

## 2021-09-07 LAB — HEPATIC FUNCTION PANEL
ALT: 20 U/L (ref 0–53)
AST: 15 U/L (ref 0–37)
Albumin: 5.1 g/dL (ref 3.5–5.2)
Alkaline Phosphatase: 42 U/L (ref 39–117)
Bilirubin, Direct: 0.1 mg/dL (ref 0.0–0.3)
Total Bilirubin: 0.6 mg/dL (ref 0.2–1.2)
Total Protein: 7.5 g/dL (ref 6.0–8.3)

## 2021-09-07 LAB — MICROALBUMIN / CREATININE URINE RATIO
Creatinine,U: 46.6 mg/dL
Microalb Creat Ratio: 10 mg/g (ref 0.0–30.0)
Microalb, Ur: 4.6 mg/dL — ABNORMAL HIGH (ref 0.0–1.9)

## 2021-09-07 LAB — LDL CHOLESTEROL, DIRECT: Direct LDL: 46 mg/dL

## 2021-09-07 LAB — HEMOGLOBIN A1C: Hgb A1c MFr Bld: 7.2 % — ABNORMAL HIGH (ref 4.6–6.5)

## 2021-09-07 LAB — TSH: TSH: 2.1 u[IU]/mL (ref 0.35–5.50)

## 2021-09-07 MED ORDER — OLMESARTAN MEDOXOMIL 20 MG PO TABS: 20.0000 mg | ORAL_TABLET | Freq: Every day | ORAL | 0 refills | Status: DC

## 2021-09-07 MED ORDER — OLMESARTAN MEDOXOMIL 20 MG PO TABS: 20.0000 mg | ORAL_TABLET | Freq: Every day | ORAL | 1 refills | Status: DC

## 2021-09-07 MED ORDER — RYBELSUS 14 MG PO TABS: 14.0000 mg | ORAL_TABLET | Freq: Every day | ORAL | 1 refills | Status: DC

## 2021-09-07 MED ORDER — OMEGA-3-ACID ETHYL ESTERS 1 G PO CAPS: ORAL_CAPSULE | ORAL | 1 refills | Status: DC

## 2021-09-07 NOTE — Patient Instructions (Signed)

## 2021-09-07 NOTE — Progress Notes (Signed)
? ?Subjective:  ?Patient ID: [name], male    DOB: 11-25-1960  Age: 61 y.o. MRN: [id number] ? ?CC: Annual Exam, Hypertension, Hyperlipidemia, and Diabetes ? ?This visit occurred during the SARS-CoV-2 public health emergency.  Safety protocols were in place, including screening questions prior to the visit, additional usage of staff PPE, and extensive cleaning of exam room while observing appropriate contact time as indicated for disinfecting solutions.   ? ?HPI ?[name] presents for a CPX and f/up -  ? ?He works out in a gym. He does not experience DOE, CP, SOB, or fatigue. ? ?Outpatient Medications Prior to Visit  ?Medication Sig Dispense Refill  ? aspirin 81 MG tablet Take 81 mg by mouth daily.    ? Blood Glucose Monitoring Suppl (ACCU-CHEK GUIDE) w/Device KIT 1 Act by Does not apply route 2 (two) times daily. 2 kit 0  ? clopidogrel (PLAVIX) 75 MG tablet Take 1 tablet (75 mg total) by mouth daily. 90 tablet 2  ? glucose blood (ACCU-CHEK GUIDE) test strip Use BID 100 each 11  ? Insulin Pen Needle 32G X 6 MM MISC 1 Act by Does not apply route daily. 100 each 1  ? metFORMIN (GLUCOPHAGE-XR) 750 MG 24 hr tablet TAKE 2 TABLETS (1,500 MG) BY MOUTH DAILY WITH BREAKFAST. 180 tablet 1  ? metoprolol tartrate (LOPRESSOR) 25 MG tablet Take 1 tablet (25 mg total) by mouth 2 (two) times daily. 180 tablet 2  ? nitroGLYCERIN (NITROSTAT) 0.4 MG SL tablet PLACE 1 TABLET UNDER THE TONGUE EVERY 5 MINUTES AS NEEDED FOR CHEST PAIN. 25 tablet 3  ? rosuvastatin (CRESTOR) 40 MG tablet Take 1 tablet (40 mg total) by mouth daily. 90 tablet 2  ? olmesartan (BENICAR) 20 MG tablet TAKE 1 TABLET BY MOUTH EVERY DAY 90 tablet 0  ? omega-3 acid ethyl esters (LOVAZA) 1 g capsule TAKE 2 CAPSULES BY MOUTH 2 TIMES DAILY. 360 capsule 1  ? Semaglutide (RYBELSUS) 14 MG TABS Take 14 mg by mouth daily. 90 tablet 1  ? STEGLATRO 15 MG TABS tablet TAKE 1 TABLET BY MOUTH DAILY BEFORE BREAKFAST. 90 tablet 0  ? ?No facility-administered medications  prior to visit.  ? ? ?ROS ?Review of Systems  ?Constitutional: Negative.  Negative for diaphoresis, fatigue and unexpected weight change.  ?HENT: Negative.    ?Eyes: Negative.   ?Respiratory:  Negative for cough, chest tightness, shortness of breath and wheezing.   ?Cardiovascular:  Negative for chest pain, palpitations and leg swelling.  ?Gastrointestinal:  Negative for abdominal pain, constipation, diarrhea, nausea and vomiting.  ?Endocrine: Negative.   ?Genitourinary: Negative.  Negative for difficulty urinating and dysuria.  ?Musculoskeletal: Negative.  Negative for arthralgias and myalgias.  ?Skin: Negative.  Negative for color change and pallor.  ?Neurological: Negative.  Negative for dizziness and weakness.  ?Hematological:  Negative for adenopathy. Does not bruise/bleed easily.  ?Psychiatric/Behavioral: Negative.    ? ?Objective:  ?BP 132/76 (BP Location: Left Arm, Patient Position: Sitting, Cuff Size: Large)   Pulse 71   Temp 98.2 ?F (36.8 ?C) (Oral)   Resp 16   Ht _0  (1.676 m)   Wt 185 lb 6 oz (84.1 kg)   SpO2 99%   BMI 29.92 kg/m?  ? ?BP Readings from Last 3 Encounters:  ?09/07/21 132/76  ?02/12/21 128/74  ?09/09/20 100/64  ? ? ?Wt Readings from Last 3 Encounters:  ?09/07/21 185 lb 6 oz (84.1 kg)  ?02/12/21 188 lb 6.4 oz (85.5 kg)  ?09/09/20 192 lb 9.6 oz (  87.4 kg)  ? ? ?Physical Exam ?Vitals reviewed. Exam conducted with a chaperone present.  ?Constitutional:   ?   Appearance: He is not ill-appearing.  ?HENT:  ?   Nose: Nose normal.  ?   Mouth/Throat:  ?   Mouth: Mucous membranes are moist.  ?Eyes:  ?   General: No scleral icterus. ?   Conjunctiva/sclera: Conjunctivae normal.  ?Cardiovascular:  ?   Rate and Rhythm: Normal rate and regular rhythm.  ?   Heart sounds: No murmur heard. ?Pulmonary:  ?   Effort: Pulmonary effort is normal.  ?   Breath sounds: No stridor. No wheezing, rhonchi or rales.  ?Abdominal:  ?   General: Abdomen is flat.  ?   Palpations: There is no mass.  ?   Tenderness:  There is no abdominal tenderness. There is no guarding or rebound.  ?   Hernia: No hernia is present. There is no hernia in the left inguinal area or right inguinal area.  ?Genitourinary: ?   Penis: Normal and circumcised.   ?   Testes: Normal.  ?   Epididymis:  ?   Right: Normal.  ?   Left: Normal.  ?   Prostate: Normal. Not enlarged, not tender and no nodules present.  ?   Rectum: Normal. Guaiac result negative. No mass, tenderness, anal fissure, external hemorrhoid or internal hemorrhoid. Normal anal tone.  ?Musculoskeletal:     ?   General: Normal range of motion.  ?   Cervical back: Neck supple.  ?   Right lower leg: No edema.  ?   Left lower leg: No edema.  ?Lymphadenopathy:  ?   Cervical: No cervical adenopathy.  ?   Lower Body: No right inguinal adenopathy. No left inguinal adenopathy.  ?Skin: ?   General: Skin is warm and dry.  ?   Coloration: Skin is not pale.  ?Neurological:  ?   General: No focal deficit present.  ?   Mental Status: He is alert.  ?Psychiatric:     ?   Mood and Affect: Mood normal.     ?   Behavior: Behavior normal.  ? ? ?Lab Results  ?Component Value Date  ? WBC 7.0 09/07/2021  ? HGB 15.0 09/07/2021  ? HCT 44.4 09/07/2021  ? PLT 210.0 09/07/2021  ? GLUCOSE 109 (H) 09/07/2021  ? CHOL 110 09/07/2021  ? TRIG 254.0 (H) 09/07/2021  ? HDL 35.30 (L) 09/07/2021  ? LDLDIRECT 46.0 09/07/2021  ? [place] 40 05/03/2019  ? ALT 20 09/07/2021  ? AST 15 09/07/2021  ? NA 141 09/07/2021  ? K 3.7 09/07/2021  ? CL 105 09/07/2021  ? CREATININE 0.75 09/07/2021  ? BUN 20 09/07/2021  ? CO2 26 09/07/2021  ? TSH 2.10 09/07/2021  ? PSA 0.39 09/07/2021  ? INR 0.9 ratio 04/22/2009  ? HGBA1C 7.2 (H) 09/07/2021  ? MICROALBUR 4.6 (H) 09/07/2021  ? ? ?DG Chest 2 View ? ?Result Date: 08/08/2016 ?CLINICAL DATA:  Initial evaluation for acute right upper quadrant pain status post surgery. EXAM: CHEST  2 VIEW COMPARISON:  Prior radiograph from 07/10/2010. FINDINGS: The cardiac and mediastinal silhouettes are stable in size and  contour, and remain within normal limits. Suspected underlying emphysema. No airspace consolidation, pleural effusion, or pulmonary edema is identified. There is no pneumothorax. No acute osseous abnormality identified. IMPRESSION: No active cardiopulmonary disease. Electronically Signed   By: Jeannine Boga M.D.   On: 08/08/2016 00:29  ? ?CT Abdomen Pelvis W Contrast ? ?  Result Date: 08/07/2016 ?CLINICAL DATA:  Right upper quadrant abdominal pain radiating to the back for 4 days. History of laparoscopic cholecystectomy and umbilical hernia repair 1 week ago. EXAM: CT ABDOMEN AND PELVIS WITH CONTRAST TECHNIQUE: Multidetector CT imaging of the abdomen and pelvis was performed using the standard protocol following bolus administration of intravenous contrast. CONTRAST:  174m ISOVUE-300 IOPAMIDOL (ISOVUE-300) INJECTION 61% COMPARISON:  07/12/2016 FINDINGS: Lower chest: Minimal dependent changes in the lung bases. Coronary artery calcifications. Hepatobiliary: Surgical absence of the gallbladder. No bile duct dilatation. No fluid collections or infiltrative changes demonstrated in the gallbladder fossa. Diffuse fatty infiltration of the liver. No focal liver lesions. Pancreas: Unremarkable. No pancreatic ductal dilatation or surrounding inflammatory changes. Spleen: Normal in size without focal abnormality. Adrenals/Urinary Tract: Adrenal glands are unremarkable. Kidneys are normal, without renal calculi, focal lesion, or hydronephrosis. Bladder is unremarkable. Stomach/Bowel: Stomach is within normal limits. Appendix appears normal. No evidence of bowel wall thickening, distention, or inflammatory changes. Vascular/Lymphatic: Aortic atherosclerosis. No enlarged abdominal or pelvic lymph nodes. Reproductive: Borderline prostate size at 3.8 cm diameter. Prostate calcifications are present. Other: There is focal infiltration in the anterior abdominal wall at the level of the umbilicus, likely representing  postoperative change. No loculated fluid collections. No free fluid or free air in the abdomen. Abdominal wall musculature appears intact. Musculoskeletal: Degenerative changes in the spine. No destructive bone lesions. I

## 2021-09-08 ENCOUNTER — Telehealth: Payer: Self-pay | Admitting: Internal Medicine

## 2021-09-08 NOTE — Telephone Encounter (Signed)
Pts spouse [place] requesting a cb regarding which medications pt should be taking ? ?Phone [phone] ?

## 2021-09-09 NOTE — Telephone Encounter (Signed)
LVM to discuss med questions ?

## 2021-09-10 ENCOUNTER — Encounter: Payer: Self-pay | Admitting: Internal Medicine

## 2021-09-11 NOTE — Addendum Note (Signed)
Addended by: Darryll Capers on: 09/11/2021 09:34 AM ? ? Modules accepted: Orders ? ?

## 2021-09-11 NOTE — Telephone Encounter (Signed)
Pt's wife, [place], wanted clarification if Steglatro was d/c and if Rybelsus was to remain the same dose. I have have confirmed that Steglatro was d/c on 3/20 and that Rybelsus remained at a 14mg  dose. I have informed that pen needles have been removed from the med list.  ? ? ?

## 2021-09-11 NOTE — Telephone Encounter (Signed)
Pt wife calling back to discuss the questions that she had. I was able to answer her questions.  ? ?Insulin Pen Needle 32G X 6 MM MISC Pt doesn't use. Please Remove from Med list. ? ?FYI ?

## 2021-09-15 ENCOUNTER — Other Ambulatory Visit: Payer: Self-pay | Admitting: Physician Assistant

## 2021-09-15 ENCOUNTER — Other Ambulatory Visit: Payer: Self-pay | Admitting: Internal Medicine

## 2021-09-15 DIAGNOSIS — E118 Type 2 diabetes mellitus with unspecified complications: Secondary | ICD-10-CM

## 2021-09-21 ENCOUNTER — Ambulatory Visit: Payer: 59 | Admitting: Internal Medicine

## 2021-12-16 ENCOUNTER — Other Ambulatory Visit: Payer: Self-pay | Admitting: Internal Medicine

## 2021-12-16 DIAGNOSIS — I2510 Atherosclerotic heart disease of native coronary artery without angina pectoris: Secondary | ICD-10-CM

## 2022-01-03 ENCOUNTER — Other Ambulatory Visit: Payer: Self-pay | Admitting: Physician Assistant

## 2022-01-03 DIAGNOSIS — I10 Essential (primary) hypertension: Secondary | ICD-10-CM

## 2022-01-03 DIAGNOSIS — E781 Pure hyperglyceridemia: Secondary | ICD-10-CM

## 2022-01-18 ENCOUNTER — Telehealth: Payer: Self-pay

## 2022-01-18 NOTE — Telephone Encounter (Signed)
Approved for the dates of 12/19/2021 - 01/18/2023

## 2022-02-05 ENCOUNTER — Other Ambulatory Visit: Payer: Self-pay | Admitting: Internal Medicine

## 2022-02-05 ENCOUNTER — Telehealth: Payer: Self-pay

## 2022-02-05 DIAGNOSIS — Z1211 Encounter for screening for malignant neoplasm of colon: Secondary | ICD-10-CM

## 2022-02-05 NOTE — Telephone Encounter (Signed)
Pt wife is calling to requesting instead of doing a Cologuard kit to just refer to GI for a standard Colonoscopy because their insurance will  only cover one a year.   With Cologuard kit if anything is detected that will lead to a Colonoscopy.   Please advise

## 2022-02-08 ENCOUNTER — Ambulatory Visit: Payer: 59 | Admitting: Cardiovascular Disease

## 2022-02-08 ENCOUNTER — Encounter: Payer: Self-pay | Admitting: Cardiovascular Disease

## 2022-02-08 VITALS — BP 112/68 | HR 68 | Ht 66.0 in | Wt 188.8 lb

## 2022-02-08 DIAGNOSIS — E781 Pure hyperglyceridemia: Secondary | ICD-10-CM | POA: Diagnosis not present

## 2022-02-08 DIAGNOSIS — I2510 Atherosclerotic heart disease of native coronary artery without angina pectoris: Secondary | ICD-10-CM | POA: Diagnosis not present

## 2022-02-08 DIAGNOSIS — I10 Essential (primary) hypertension: Secondary | ICD-10-CM

## 2022-02-08 MED ORDER — ROSUVASTATIN CALCIUM 40 MG PO TABS: 40.0000 mg | ORAL_TABLET | Freq: Every day | ORAL | 3 refills | Status: DC

## 2022-02-08 MED ORDER — METOPROLOL TARTRATE 25 MG PO TABS: 25.0000 mg | ORAL_TABLET | Freq: Two times a day (BID) | ORAL | 3 refills | Status: DC

## 2022-02-08 NOTE — Patient Instructions (Signed)
Medication Instructions:  Your physician recommends that you continue on your current medications as directed. Please refer to the Current Medication list given to you today.  *If you need a refill on your cardiac medications before your next appointment, please call your pharmacy*   Follow-Up: At [facility], you and your health needs are our priority.  As part of our continuing mission to provide you with exceptional heart care, we have created designated Provider Care Teams.  These Care Teams include your primary Cardiologist (physician) and Advanced Practice Providers (APPs -  Physician Assistants and Nurse Practitioners) who all work together to provide you with the care you need, when you need it.  We recommend signing up for the patient portal called "MyChart".  Sign up information is provided on this After Visit Summary.  MyChart is used to connect with patients for Virtual Visits (Telemedicine).  Patients are able to view lab/test results, encounter notes, upcoming appointments, etc.  Non-urgent messages can be sent to your provider as well.   To learn more about what you can do with MyChart, go to [email or web address].    Your next appointment:   1 year(s)  The format for your next appointment:   In Person  Provider:   Michael Cooper, MD {        

## 2022-02-08 NOTE — Progress Notes (Signed)
Cardiology Office Note:    Date:  02/08/2022   ID:  [name], DOB [birth year], MRN [id number]  PCP:  Janith Lima, MD   [facility] Prov[facility] Cardiologist:  Sherren Mocha, MD     Referring MD: Janith Lima, MD   Chief Complaint  Patient presents with   Coronary Artery Disease    History of Present Illness:    [name] is a 61 y.o. male with a hx of: Coronary artery disease  Hx of ACS  S/p multiple PCI/stent procedures  Hx of BMS to RCA, BMS to LCx and DES to RCA (inside BMS) Long term DAPT Hypertension  Diabetes mellitus  Hyperlipidemia  OSA   The patient is here alone today.  He has been feeling well.  He denies chest pain, heart palpitations, or shortness of breath.  No edema, orthopnea, or PND.  He is working as a Therapist, music at a large apartment complex and he remains very active with his job.  He has no exertional symptoms.  He reports no changes in his medications.  Overall he feels that he is doing well from a cardiac perspective.  Past Medical History:  Diagnosis Date   Asthma    Coronary artery disease    s/p BMS to RCA and CFX. s/p DES to RCA 2/2 ISR 2010   Diabetes mellitus without complication ([place])    History of kidney stones    20 yrs ago    Hyperlipidemia    Hypertension    Old myocardial infarction    Paroxysmal ventricular tachycardia ([place])    Sleep apnea    not using cpap    5-6 yrs     Past Surgical History:  Procedure Laterality Date   CARDIAC CATHETERIZATION     CHOLECYSTECTOMY N/A 07/28/2016   Procedure: LAPAROSCOPIC CHOLECYSTECTOMY;  Surgeon: Coralie Keens, MD;  Location: [facility] OR;  Service: General;  Laterality: N/A;   CORONARY STENT PLACEMENT  2002, 2007, 7824   right   [place] N/A 07/28/2016   Procedure: HERNIA REPAIR UMBILICAL ADULT;  Surgeon: Coralie Keens, MD;  Location: [facility];  Service: General;  Laterality: N/A;    Current Medications: Current Meds  Medication Sig    aspirin 81 MG tablet Take 81 mg by mouth daily.   Blood Glucose Monitoring Suppl (ACCU-CHEK GUIDE) w/Device KIT 1 Act by Does not apply route 2 (two) times daily.   clopidogrel (PLAVIX) 75 MG tablet TAKE 1 TABLET BY MOUTH EVERY DAY   glucose blood (ACCU-CHEK GUIDE) test strip Use BID   metFORMIN (GLUCOPHAGE-XR) 750 MG 24 hr tablet TAKE 2 TABLETS (1,500 MG) BY MOUTH DAILY WITH BREAKFAST.   nitroGLYCERIN (NITROSTAT) 0.4 MG SL tablet PLACE 1 TABLET UNDER THE TONGUE EVERY 5 MINUTES AS NEEDED FOR CHEST PAIN.   olmesartan (BENICAR) 20 MG tablet Take 1 tablet (20 mg total) by mouth daily.   omega-3 acid ethyl esters (LOVAZA) 1 g capsule TAKE 2 CAPSULES BY MOUTH 2 TIMES DAILY.   Semaglutide (RYBELSUS) 14 MG TABS Take 1 tablet (14 mg total) by mouth daily.   [DISCONTINUED] metoprolol tartrate (LOPRESSOR) 25 MG tablet TAKE 1 TABLET BY MOUTH TWICE A DAY   [DISCONTINUED] rosuvastatin (CRESTOR) 40 MG tablet Take 1 tablet (40 mg total) by mouth daily. Patient needs to keep appointment in August for future refills.     Allergies:   Heparin   Social History   Socioeconomic History   Marital status: Married    Spouse name: Not  on file   Number of children: Not on file   Years of education: Not on file   Highest education level: Not on file  Occupational History   Occupation: painter    Employer: [employer]  Tobacco Use   Smoking status: Former    Packs/day: 1.50    Years: 30.00    Total pack years: 45.00    Types: Cigarettes    Quit date: 08/22/2011    Years since quitting: 10.4   Smokeless tobacco: Never   Tobacco comments:    currently smoking 1 ppd.   Vaping Use   Vaping Use: Never used  Substance and Sexual Activity   Alcohol use: No   Drug use: No   Sexual activity: Yes    Birth control/protection: None  Other Topics Concern   Not on file  Social History Narrative   Not on file   Social Determinants of Health   Financial Resource Strain: Not on file  Food Insecurity: Not on  file  Transportation Needs: Not on file  Physical Activity: Not on file  Stress: Not on file  Social Connections: Not on file     Family History: The patient's family history includes Alzheimer's disease in his mother; Diabetes in his mother and sister; Heart disease in his brother, mother, and sister; Kidney disease in his sister; Peripheral vascular disease in his sister. There is no history of Cancer, Alcohol abuse, Early death, Hyperlipidemia, Hypertension, or Stroke.  ROS:   Please see the history of present illness.    All other systems reviewed and are negative.  EKGs/Labs/Other Studies Reviewed:    EKG:  EKG is ordered today.  The ekg ordered today demonstrates normal sinus rhythm 68 bpm, within normal limits.  Recent Labs: 09/07/2021: ALT 20; BUN 20; Creatinine, Ser 0.75; Hemoglobin 15.0; Platelets 210.0; Potassium 3.7; Sodium 141; TSH 2.10  Recent Lipid Panel    Component Value Date/Time   CHOL 110 09/07/2021 1151   CHOL 116 05/03/2019 0746   CHOL 219 08/20/2012 0000   TRIG 254.0 (H) 09/07/2021 1151   TRIG 402 08/20/2012 0000   HDL 35.30 (L) 09/07/2021 1151   HDL 30 (L) 05/03/2019 0746   CHOLHDL 3 09/07/2021 1151   VLDL 50.8 (H) 09/07/2021 1151   LDLCALC 40 05/03/2019 0746   LDLCALC 107 08/20/2012 0000   LDLDIRECT 46.0 09/07/2021 1151     Risk Assessment/Calculations:                Physical Exam:    VS:  BP 112/68   Pulse 68   Ht 5' 6"  (1.676 m)   Wt 188 lb 12.8 oz (85.6 kg)   SpO2 97%   BMI 30.47 kg/m     Wt Readings from Last 3 Encounters:  02/08/22 188 lb 12.8 oz (85.6 kg)  09/07/21 185 lb 6 oz (84.1 kg)  02/12/21 188 lb 6.4 oz (85.5 kg)     GEN:  Well nourished, well developed in no acute distress HEENT: Normal NECK: No JVD; No carotid bruits LYMPHATICS: No lymphadenopathy CARDIAC: RRR, no murmurs, rubs, gallops RESPIRATORY:  Clear to auscultation without rales, wheezing or rhonchi  ABDOMEN: Soft, non-tender,  non-distended MUSCULOSKELETAL:  No edema; No deformity  SKIN: Warm and dry NEUROLOGIC:  Alert and oriented x 3 PSYCHIATRIC:  Normal affect   ASSESSMENT:    1. Atherosclerosis of native coronary artery of native heart without angina pectoris   2. Essential hypertension   3. Pure hyperglyceridemia    PLAN:  In order of problems listed above:  The patient is stable without symptoms of angina.  He is many years out from his last PCI procedure.  He has been maintained on long-term dual antiplatelet therapy in the setting of multivessel stenting and first generation DES implantation.  He seems to tolerate this well other than easy bruising.  No changes are made in his medical regimen.  He is on a beta-blocker and a high intensity statin drug.  Lifestyle modification is discussed.  He has lost some weight and will continue to work on further weight loss. Blood pressure is well controlled on his current medical program which includes metoprolol and olmesartan. Lipids reviewed with total cholesterol 110, HDL 35, LDL 46.  Continue rosuvastatin 40 mg daily.       Medication Adjustments/Labs and Tests Ordered: Current medicines are reviewed at length with the patient today.  Concerns regarding medicines are outlined above.  Orders Placed This Encounter  Procedures   EKG 12-Lead   Meds ordered this encounter  Medications   rosuvastatin (CRESTOR) 40 MG tablet    Sig: Take 1 tablet (40 mg total) by mouth daily.    Dispense:  90 tablet    Refill:  3   metoprolol tartrate (LOPRESSOR) 25 MG tablet    Sig: Take 1 tablet (25 mg total) by mouth 2 (two) times daily.    Dispense:  180 tablet    Refill:  3    Patient Instructions  Medication Instructions:  Your physician recommends that you continue on your current medications as directed. Please refer to the Current Medication list given to you today.  *If you need a refill on your cardiac medications before your next appointment, please call  your pharmacy*  Follow-Up: At [facility], you and your health needs are our priority.  As part of our continuing mission to provide you with exceptional heart care, we have created designated Provider Care Teams.  These Care Teams include your primary Cardiologist (physician) and Advanced Practice Providers (APPs -  Physician Assistants and Nurse Practitioners) who all work together to provide you with the care you need, when you need it.  We recommend signing up for the patient portal called "MyChart".  Sign up information is provided on this After Visit Summary.  MyChart is used to connect with patients for Virtual Visits (Telemedicine).  Patients are able to view lab/test results, encounter notes, upcoming appointments, etc.  Non-urgent messages can be sent to your provider as well.   To learn more about what you can do with MyChart, go to [email or web address].    Your next appointment:   1 year(s)  The format for your next appointment:   In Person  Provider:   Sherren Mocha, MD {          Signed, Sherren Mocha, MD  02/08/2022 1:22 PM    [place]

## 2022-03-19 ENCOUNTER — Encounter: Payer: Self-pay | Admitting: Physician Assistant

## 2022-03-22 ENCOUNTER — Encounter: Payer: Self-pay | Admitting: Internal Medicine

## 2022-03-22 ENCOUNTER — Ambulatory Visit: Payer: 59 | Admitting: Internal Medicine

## 2022-03-22 ENCOUNTER — Telehealth: Payer: Self-pay | Admitting: Internal Medicine

## 2022-03-22 VITALS — BP 126/86 | HR 78 | Temp 98.4°F | Ht 66.0 in | Wt 186.0 lb

## 2022-03-22 DIAGNOSIS — E118 Type 2 diabetes mellitus with unspecified complications: Secondary | ICD-10-CM

## 2022-03-22 DIAGNOSIS — E781 Pure hyperglyceridemia: Secondary | ICD-10-CM

## 2022-03-22 DIAGNOSIS — E119 Type 2 diabetes mellitus without complications: Secondary | ICD-10-CM | POA: Diagnosis not present

## 2022-03-22 DIAGNOSIS — I10 Essential (primary) hypertension: Secondary | ICD-10-CM | POA: Diagnosis not present

## 2022-03-22 DIAGNOSIS — Z23 Encounter for immunization: Secondary | ICD-10-CM

## 2022-03-22 LAB — BASIC METABOLIC PANEL
BUN: 16 mg/dL (ref 6–23)
CO2: 26 mEq/L (ref 19–32)
Calcium: 10.3 mg/dL (ref 8.4–10.5)
Chloride: 101 mEq/L (ref 96–112)
Creatinine, Ser: 0.86 mg/dL (ref 0.40–1.50)
GFR: 95.21 mL/min (ref >60.00)
Glucose, Bld: 176 mg/dL — ABNORMAL HIGH (ref 70–99)
Potassium: 3.8 mEq/L (ref 3.5–5.1)
Sodium: 137 mEq/L (ref 135–145)

## 2022-03-22 LAB — TRIGLYCERIDES: Triglycerides: 235 mg/dL — ABNORMAL HIGH (ref 0.0–149.0)

## 2022-03-22 LAB — HEMOGLOBIN A1C: Hgb A1c MFr Bld: 9 % — ABNORMAL HIGH (ref 4.6–6.5)

## 2022-03-22 MED ORDER — RYBELSUS 14 MG PO TABS: 14.0000 mg | ORAL_TABLET | Freq: Every day | ORAL | 1 refills | Status: DC

## 2022-03-22 MED ORDER — TOUJEO MAX SOLOSTAR 300 UNIT/ML ~~LOC~~ SOPN: 30.0000 [IU] | PEN_INJECTOR | Freq: Every day | SUBCUTANEOUS | 1 refills | Status: DC

## 2022-03-22 MED ORDER — FREESTYLE LIBRE 3 SENSOR MISC: 1.0000 | Freq: Every day | 5 refills | Status: DC

## 2022-03-22 MED ORDER — OMEGA-3-ACID ETHYL ESTERS 1 G PO CAPS: ORAL_CAPSULE | ORAL | 1 refills | Status: DC

## 2022-03-22 MED ORDER — INSULIN PEN NEEDLE 32G X 6 MM MISC: 1.0000 | Freq: Every day | 1 refills | Status: DC

## 2022-03-22 NOTE — Telephone Encounter (Signed)
Patient's wife called and said that his meds from today will have to be changed.  "They can not afford the trujeo or the free style libre sensor and monitor

## 2022-03-22 NOTE — Progress Notes (Signed)
Subjective:  Patient ID: [name], male    DOB: 11-Dec-1960  Age: 61 y.o. MRN: [id number]  CC: Hypertension and Diabetes   HPI [name] presents for f/up -  He is active and denies DOE, CP, edema, palpitations.  Outpatient Medications Prior to Visit  Medication Sig Dispense Refill   aspirin 81 MG tablet Take 81 mg by mouth daily.     clopidogrel (PLAVIX) 75 MG tablet TAKE 1 TABLET BY MOUTH EVERY DAY 90 tablet 0   metFORMIN (GLUCOPHAGE-XR) 750 MG 24 hr tablet TAKE 2 TABLETS (1,500 MG) BY MOUTH DAILY WITH BREAKFAST. 180 tablet 1   metoprolol tartrate (LOPRESSOR) 25 MG tablet Take 1 tablet (25 mg total) by mouth 2 (two) times daily. 180 tablet 3   nitroGLYCERIN (NITROSTAT) 0.4 MG SL tablet PLACE 1 TABLET UNDER THE TONGUE EVERY 5 MINUTES AS NEEDED FOR CHEST PAIN. 25 tablet 3   olmesartan (BENICAR) 20 MG tablet Take 1 tablet (20 mg total) by mouth daily. 90 tablet 1   rosuvastatin (CRESTOR) 40 MG tablet Take 1 tablet (40 mg total) by mouth daily. 90 tablet 3   Blood Glucose Monitoring Suppl (ACCU-CHEK GUIDE) w/Device KIT 1 Act by Does not apply route 2 (two) times daily. 2 kit 0   glucose blood (ACCU-CHEK GUIDE) test strip Use BID 100 each 11   omega-3 acid ethyl esters (LOVAZA) 1 g capsule TAKE 2 CAPSULES BY MOUTH 2 TIMES DAILY. 360 capsule 1   Semaglutide (RYBELSUS) 14 MG TABS Take 1 tablet (14 mg total) by mouth daily. 90 tablet 1   No facility-administered medications prior to visit.    ROS Review of Systems  Constitutional:  Negative for diaphoresis and fatigue.  HENT: Negative.    Eyes: Negative.   Respiratory:  Negative for cough, chest tightness, shortness of breath and wheezing.   Cardiovascular:  Negative for chest pain, palpitations and leg swelling.  Gastrointestinal:  Negative for abdominal pain, constipation, diarrhea, nausea and vomiting.  Endocrine: Negative.  Negative for polydipsia, polyphagia and polyuria.  Genitourinary: Negative.  Negative for  difficulty urinating.  Musculoskeletal: Negative.   Skin: Negative.   Neurological: Negative.  Negative for dizziness and weakness.  Hematological:  Negative for adenopathy. Does not bruise/bleed easily.  Psychiatric/Behavioral: Negative.      Objective:  BP 126/86 (BP Location: Left Arm, Patient Position: Sitting, Cuff Size: Large)   Pulse 78   Temp 98.4 F (36.9 C) (Oral)   Ht 5' 6"  (1.676 m)   Wt 186 lb (84.4 kg)   SpO2 94%   BMI 30.02 kg/m   BP Readings from Last 3 Encounters:  03/22/22 126/86  02/08/22 112/68  09/07/21 132/76    Wt Readings from Last 3 Encounters:  03/22/22 186 lb (84.4 kg)  02/08/22 188 lb 12.8 oz (85.6 kg)  09/07/21 185 lb 6 oz (84.1 kg)    Physical Exam Vitals reviewed.  Constitutional:      Appearance: He is not ill-appearing.  HENT:     Mouth/Throat:     Mouth: Mucous membranes are moist.  Eyes:     General: No scleral icterus.    Conjunctiva/sclera: Conjunctivae normal.  Cardiovascular:     Rate and Rhythm: Normal rate and regular rhythm.     Heart sounds: No murmur heard. Pulmonary:     Effort: Pulmonary effort is normal.     Breath sounds: No stridor. No wheezing, rhonchi or rales.  Abdominal:     General: Abdomen is flat.  Palpations: There is no mass.     Tenderness: There is no abdominal tenderness. There is no guarding.     Hernia: No hernia is present.  Musculoskeletal:        General: Normal range of motion.     Cervical back: Neck supple.     Right lower leg: No edema.     Left lower leg: No edema.  Lymphadenopathy:     Cervical: No cervical adenopathy.  Skin:    General: Skin is warm and dry.  Neurological:     General: No focal deficit present.     Mental Status: He is alert.  Psychiatric:        Mood and Affect: Mood normal.        Behavior: Behavior normal.     Lab Results  Component Value Date   WBC 7.0 09/07/2021   HGB 15.0 09/07/2021   HCT 44.4 09/07/2021   PLT 210.0 09/07/2021   GLUCOSE 176 (H)  03/22/2022   CHOL 110 09/07/2021   TRIG 235.0 (H) 03/22/2022   HDL 35.30 (L) 09/07/2021   LDLDIRECT 46.0 09/07/2021   LDLCALC 40 05/03/2019   ALT 20 09/07/2021   AST 15 09/07/2021   NA 137 03/22/2022   K 3.8 03/22/2022   CL 101 03/22/2022   CREATININE 0.86 03/22/2022   BUN 16 03/22/2022   CO2 26 03/22/2022   TSH 2.10 09/07/2021   PSA 0.39 09/07/2021   INR 0.9 ratio 04/22/2009   HGBA1C 9.0 (H) 03/22/2022   MICROALBUR 4.6 (H) 09/07/2021    DG Chest 2 View  Result Date: 08/08/2016 CLINICAL DATA:  Initial evaluation for acute right upper quadrant pain status post surgery. EXAM: CHEST  2 VIEW COMPARISON:  Prior radiograph from 07/10/2010. FINDINGS: The cardiac and mediastinal silhouettes are stable in size and contour, and remain within normal limits. Suspected underlying emphysema. No airspace consolidation, pleural effusion, or pulmonary edema is identified. There is no pneumothorax. No acute osseous abnormality identified. IMPRESSION: No active cardiopulmonary disease. Electronically Signed   By: Jeannine Boga M.D.   On: 08/08/2016 00:29   CT Abdomen Pelvis W Contrast  Result Date: 08/07/2016 CLINICAL DATA:  Right upper quadrant abdominal pain radiating to the back for 4 days. History of laparoscopic cholecystectomy and umbilical hernia repair 1 week ago. EXAM: CT ABDOMEN AND PELVIS WITH CONTRAST TECHNIQUE: Multidetector CT imaging of the abdomen and pelvis was performed using the standard protocol following bolus administration of intravenous contrast. CONTRAST:  171m ISOVUE-300 IOPAMIDOL (ISOVUE-300) INJECTION 61% COMPARISON:  07/12/2016 FINDINGS: Lower chest: Minimal dependent changes in the lung bases. Coronary artery calcifications. Hepatobiliary: Surgical absence of the gallbladder. No bile duct dilatation. No fluid collections or infiltrative changes demonstrated in the gallbladder fossa. Diffuse fatty infiltration of the liver. No focal liver lesions. Pancreas: Unremarkable.  No pancreatic ductal dilatation or surrounding inflammatory changes. Spleen: Normal in size without focal abnormality. Adrenals/Urinary Tract: Adrenal glands are unremarkable. Kidneys are normal, without renal calculi, focal lesion, or hydronephrosis. Bladder is unremarkable. Stomach/Bowel: Stomach is within normal limits. Appendix appears normal. No evidence of bowel wall thickening, distention, or inflammatory changes. Vascular/Lymphatic: Aortic atherosclerosis. No enlarged abdominal or pelvic lymph nodes. Reproductive: Borderline prostate size at 3.8 cm diameter. Prostate calcifications are present. Other: There is focal infiltration in the anterior abdominal wall at the level of the umbilicus, likely representing postoperative change. No loculated fluid collections. No free fluid or free air in the abdomen. Abdominal wall musculature appears intact. Musculoskeletal: Degenerative changes in the  spine. No destructive bone lesions. IMPRESSION: Postoperative cholecystectomy. No fluid or infiltration in the gallbladder fossa. No free fluid in the abdomen. Focal infiltration in the anterior abdominal wall around the umbilical region is likely postoperative. No loculated collection to suggest abscess. Diffuse fatty infiltration of the liver. No evidence of bowel obstruction or inflammation. Electronically Signed   By: Lucienne Capers M.D.   On: 08/07/2016 22:46    Assessment & Plan:   [name] was seen today for hypertension and diabetes.  Diagnoses and all orders for this visit:  Essential hypertension- His BP is well controlled. -     Basic metabolic panel; Future -     Basic metabolic panel  Type II diabetes mellitus with manifestations (HCC) -     Basic metabolic panel; Future -     Hemoglobin A1c; Future -     Hemoglobin A1c -     Basic metabolic panel -     Semaglutide (RYBELSUS) 14 MG TABS; Take 1 tablet (14 mg total) by mouth daily.  Pure hyperglyceridemia -     Triglycerides; Future -      Triglycerides -     omega-3 acid ethyl esters (LOVAZA) 1 g capsule; TAKE 2 CAPSULES BY MOUTH 2 TIMES DAILY. -     insulin glargine, 2 Unit Dial, (TOUJEO MAX SOLOSTAR) 300 UNIT/ML Solostar Pen; Inject 30 Units into the skin daily.  Type 2 diabetes mellitus without complication, without long-term current use of insulin ([place])- His A1C is up to 9.0% - will add basal insulin. -     Continuous Blood Gluc Sensor (FREESTYLE LIBRE 3 SENSOR) MISC; 1 Act by Does not apply route daily. Place 1 sensor on the skin every 14 days. Use to check glucose continuously -     Semaglutide (RYBELSUS) 14 MG TABS; Take 1 tablet (14 mg total) by mouth daily. -     insulin glargine, 2 Unit Dial, (TOUJEO MAX SOLOSTAR) 300 UNIT/ML Solostar Pen; Inject 30 Units into the skin daily. -     Insulin Pen Needle 32G X 6 MM MISC; 1 Act by Does not apply route daily.  Other orders -     Flu Vaccine QUAD 6+ mos PF IM (Fluarix Quad PF)   I have discontinued David Rickman "Kenny"'s Accu-Chek Guide and glucose blood. I am also having him start on FreeStyle Libre 3 Sensor, [employer], and Insulin Pen Needle. Additionally, I am having him maintain his aspirin, nitroGLYCERIN, olmesartan, metFORMIN, clopidogrel, rosuvastatin, metoprolol tartrate, omega-3 acid ethyl esters, and Rybelsus.  Meds ordered this encounter  Medications   Continuous Blood Gluc Sensor (FREESTYLE LIBRE 3 SENSOR) MISC    Sig: 1 Act by Does not apply route daily. Place 1 sensor on the skin every 14 days. Use to check glucose continuously    Dispense:  2 each    Refill:  5   omega-3 acid ethyl esters (LOVAZA) 1 g capsule    Sig: TAKE 2 CAPSULES BY MOUTH 2 TIMES DAILY.    Dispense:  360 capsule    Refill:  1   Semaglutide (RYBELSUS) 14 MG TABS    Sig: Take 1 tablet (14 mg total) by mouth daily.    Dispense:  90 tablet    Refill:  1   insulin glargine, 2 Unit Dial, (TOUJEO MAX SOLOSTAR) 300 UNIT/ML Solostar Pen    Sig: Inject 30 Units into the skin  daily.    Dispense:  9 mL    Refill:  1  Insulin Pen Needle 32G X 6 MM MISC    Sig: 1 Act by Does not apply route daily.    Dispense:  100 each    Refill:  1     Follow-up: Return in about 6 months (around 09/21/2022).  Scarlette Calico, MD

## 2022-03-22 NOTE — Patient Instructions (Signed)
                                                                    [email or web address]                       

## 2022-03-24 NOTE — Telephone Encounter (Signed)
Patient's wife is calling in frustrated as they havent heard back. Advised that all messages can take 24-48 hours for a response, wife verbalized okay and said she will wait to hear back from [place]. Would like a call back to her cell phone number if possible.

## 2022-03-25 ENCOUNTER — Other Ambulatory Visit: Payer: Self-pay | Admitting: Internal Medicine

## 2022-03-25 ENCOUNTER — Telehealth: Payer: Self-pay | Admitting: Internal Medicine

## 2022-03-25 DIAGNOSIS — E1129 Type 2 diabetes mellitus with other diabetic kidney complication: Secondary | ICD-10-CM

## 2022-03-25 DIAGNOSIS — E119 Type 2 diabetes mellitus without complications: Secondary | ICD-10-CM

## 2022-03-25 DIAGNOSIS — I517 Cardiomegaly: Secondary | ICD-10-CM

## 2022-03-25 DIAGNOSIS — E118 Type 2 diabetes mellitus with unspecified complications: Secondary | ICD-10-CM

## 2022-03-25 MED ORDER — DAPAGLIFLOZIN PROPANEDIOL 10 MG PO TABS: 10.0000 mg | ORAL_TABLET | Freq: Every day | ORAL | 1 refills | Status: DC

## 2022-03-25 NOTE — Telephone Encounter (Signed)
LVM for pt's wife, [name], to discuss.

## 2022-03-25 NOTE — Telephone Encounter (Signed)
Patient's wife called to discuss medications - she was returning your call - she is at work and would like for you to call her at 4:10 pm.  She will be in the car then and can answer.

## 2022-03-26 NOTE — Telephone Encounter (Signed)
Patient's wife is calling in, they added the farxiga - does he need to continue talking rybelsus and metformin or does he need to discontinue.

## 2022-03-26 NOTE — Telephone Encounter (Signed)
Called pt's wife, [name], LVM informing her of PCP notation on 10/2 phone note  "The 2 items that are too expensive have been discontinued  Start farxiga and come back in 3 months for a repeat A1C   TJ "

## 2022-03-26 NOTE — Telephone Encounter (Signed)
LVM for [name] stating that pt should continue with all 3 meds.

## 2022-04-23 ENCOUNTER — Telehealth: Payer: Self-pay

## 2022-04-23 ENCOUNTER — Encounter: Payer: Self-pay | Admitting: Internal Medicine

## 2022-04-23 ENCOUNTER — Ambulatory Visit: Payer: 59 | Admitting: Internal Medicine

## 2022-04-23 VITALS — BP 126/66 | HR 72 | Ht 66.0 in | Wt 186.0 lb

## 2022-04-23 DIAGNOSIS — Z1211 Encounter for screening for malignant neoplasm of colon: Secondary | ICD-10-CM | POA: Diagnosis not present

## 2022-04-23 NOTE — Telephone Encounter (Signed)
[facility] Pre-operative Risk Assessment     Request for surgical clearance:     Endoscopy Procedure  What type of surgery is being performed?     Colonoscopy   When is this surgery scheduled?     06/11/22  What type of clearance is required ?   Pharmacy  Are there any medications that need to be held prior to surgery and how long? Plavix X 5 days  Practice name and name of physician performing surgery?      [facility]  What is your office phone and fax number?      Phone- [phone]  [phone]  Anesthesia type (None, local, MAC, general) ?       MAC

## 2022-04-23 NOTE — Patient Instructions (Signed)
If you are age 59 or younger, your body mass index should be between 19-25. Your Body mass index is 30.02 kg/m. If this is out of the aformentioned range listed, please consider follow up with your Primary Care Provider.  ________________________________________________________  The [facility] providers would like to encourage you to use [facility] to communicate with providers for non-urgent requests or questions.  Due to long hold times on the telephone, sending your provider a message by [facility] may be a faster and more efficient way to get a response.  Please allow 48 business hours for a response.  Please remember that this is for non-urgent requests.  _______________________________________________________  [name] have been scheduled for a colonoscopy. Please follow written instructions given to you at your visit today.  Please pick up your prep supplies at the pharmacy within the next 1-3 days. If you use inhalers (even only as needed), please bring them with you on the day of your procedure.  Due to recent changes in healthcare laws, you may see the results of your imaging and laboratory studies on MyChart before your provider has had a chance to review them.  We understand that in some cases there may be results that are confusing or concerning to you. Not all laboratory results come back in the same time frame and the provider may be waiting for multiple results in order to interpret others.  Please give [place] 48 hours in order for your provider to thoroughly review all the results before contacting the office for clarification of your results.   Thank you for entrusting me with your care and choosing [facility].  Dr Lorenso Courier

## 2022-04-23 NOTE — Telephone Encounter (Signed)
   Name: [name]  DOB: 11/16/1960  MRN: [id number]   Primary Cardiologist: Sherren Mocha, MD  Chart reviewed as part of pre-operative protocol coverage. Patient was contacted 04/23/2022 in reference to pre-operative risk assessment for pending surgery as outlined below.  [name] was last seen on 02/08/2022 by Dr. Burt Knack.  Since that day, [name] has done well.  He denies any new symptoms or concerns.  He is able to complete greater than 4 METS without difficulty.  Therefore, based on ACC/AHA guidelines, the patient would be at acceptable risk for the planned procedure without further cardiovascular testing.   The patient was advised that if he develops new symptoms prior to surgery to contact our office to arrange for a follow-up visit, and he verbalized understanding.  Per office protocol, he may hold Plavix for 5 days prior to procedure. Please resume Plavix as soon as possible postprocedure, at the discretion of the surgeon.   I will route this recommendation to the requesting party via Epic fax function and remove from pre-op pool. Please call with questions.  Lenna Sciara, NP 04/23/2022, 12:32 PM

## 2022-04-23 NOTE — Progress Notes (Signed)
Chief Complaint: Colon cancer screening  HPI : 61 year old male with history of DM, CAD s/p PCI on Plavix, V-tach, OSA, and asthma presents to discuss colon cancer screening.  Patient presents to discuss colon cancer screening. He has never had a colonoscopy in the past. Denies blood in the stools, changes in bowel habits, diarrhea, constipation, or unintentional weight loss. Denies N&V, ab pain, dysphagia, chest burning, or regurgitation. Denies family history of colon cancer. He is on Plavix therapy. His last PCI was several years ago.  Past Medical History:  Diagnosis Date   Asthma    Coronary artery disease    s/p BMS to RCA and CFX. s/p DES to RCA 2/2 ISR 2010   Diabetes mellitus without complication (HCC)    Elevated cholesterol    History of kidney stones    20 yrs ago    Hyperlipidemia    Hypertension    Old myocardial infarction    Paroxysmal ventricular tachycardia ([place])    Pneumonia    Sleep apnea    not using cpap    5-6 yrs    Past Surgical History:  Procedure Laterality Date   CARDIAC CATHETERIZATION     CHOLECYSTECTOMY N/A 07/28/2016   Procedure: LAPAROSCOPIC CHOLECYSTECTOMY;  Surgeon: Coralie Keens, MD;  Location: [facility];  Service: General;  Laterality: N/A;   CORONARY STENT PLACEMENT  2002, 2007, [id number]   right   [place] N/A 07/28/2016   Procedure: HERNIA REPAIR UMBILICAL ADULT;  Surgeon: Coralie Keens, MD;  Location: [facility];  Service: General;  Laterality: N/A;   Family History  Problem Relation Age of Onset   Heart disease Mother    Diabetes Mother    Alzheimer's disease Mother    Diabetes Sister    Heart disease Brother        bypass for both   Heart disease Sister        bypass   Peripheral vascular disease Sister    Kidney disease Sister    Cancer Neg Hx    Alcohol abuse Neg Hx    Early death Neg Hx    Hyperlipidemia Neg Hx    Hypertension Neg Hx    Stroke Neg Hx    Social History   Tobacco Use   Smoking status: Former     Packs/day: 1.50    Years: 30.00    Total pack years: 45.00    Types: Cigarettes    Quit date: 08/22/2011    Years since quitting: 10.6   Smokeless tobacco: Never   Tobacco comments:    currently smoking 1 ppd.   Vaping Use   Vaping Use: Never used  Substance Use Topics   Alcohol use: No   Drug use: No   Current Outpatient Medications  Medication Sig Dispense Refill   aspirin 81 MG tablet Take 81 mg by mouth daily.     Cholecalciferol (VITAMIN D3) 50 MCG (2000 UT) TABS Take 1 tablet by mouth daily.     clopidogrel (PLAVIX) 75 MG tablet TAKE 1 TABLET BY MOUTH EVERY DAY 90 tablet 0   dapagliflozin propanediol (FARXIGA) 10 MG TABS tablet Take 1 tablet (10 mg total) by mouth daily before breakfast. 90 tablet 1   metFORMIN (GLUCOPHAGE-XR) 750 MG 24 hr tablet TAKE 2 TABLETS (1,500 MG) BY MOUTH DAILY WITH BREAKFAST. 180 tablet 1   metoprolol tartrate (LOPRESSOR) 25 MG tablet Take 1 tablet (25 mg total) by mouth 2 (two) times daily. 180 tablet 3  nitroGLYCERIN (NITROSTAT) 0.4 MG SL tablet PLACE 1 TABLET UNDER THE TONGUE EVERY 5 MINUTES AS NEEDED FOR CHEST PAIN. 25 tablet 3   olmesartan (BENICAR) 20 MG tablet Take 1 tablet (20 mg total) by mouth daily. 90 tablet 1   omega-3 acid ethyl esters (LOVAZA) 1 g capsule TAKE 2 CAPSULES BY MOUTH 2 TIMES DAILY. 360 capsule 1   rosuvastatin (CRESTOR) 40 MG tablet Take 1 tablet (40 mg total) by mouth daily. 90 tablet 3   Semaglutide (RYBELSUS) 14 MG TABS Take 1 tablet (14 mg total) by mouth daily. 90 tablet 1   Insulin Pen Needle 32G X 6 MM MISC 1 Act by Does not apply route daily. (Patient not taking: Reported on 04/23/2022) 100 each 1   No current facility-administered medications for this visit.   Allergies  Allergen Reactions   Heparin     Low bp   Review of Systems: All systems reviewed and negative except where noted in HPI.   Physical Exam: BP 126/66   Pulse 72   Ht 5\' 6"  (1.676 m)   Wt 186 lb (84.4 kg)   BMI 30.02 kg/m  Constitutional:  Pleasant,well-developed, male in no acute distress. HEENT: Normocephalic and atraumatic. Conjunctivae are normal. No scleral icterus. Cardiovascular: Normal rate, regular rhythm.  Pulmonary/chest: Effort normal and breath sounds normal. No wheezing, rales or rhonchi. Abdominal: Soft, nondistended, nontender. Bowel sounds active throughout. There are no masses palpable. No hepatomegaly. Extremities: No edema Neurological: Alert and oriented to person place and time. Skin: Skin is warm and dry. No rashes noted. Psychiatric: Normal mood and affect. Behavior is normal.  Labs 08/2021: CBC nml. TSH nml.   Labs 03/2022: BMP with mildly elevated BMP. HbA1C 9%.  CT A/P w/contrast 07/12/16: IMPRESSION: No acute findings in the abdomen or pelvis. Fatty infiltration of the liver. Cholelithiasis. Extensive coronary artery calcifications and aortic atherosclerosis.  CT A/P w/contrast 08/07/16: IMPRESSION: Postoperative cholecystectomy. No fluid or infiltration in the gallbladder fossa. No free fluid in the abdomen. Focal infiltration in the anterior abdominal wall around the umbilical region is likely postoperative. No loculated collection to suggest abscess. Diffuse fatty infiltration of the liver. No evidence of bowel obstruction or inflammation.  ASSESSMENT AND PLAN: Colon cancer screening Patient presents to discuss colon cancer screening. He has never had a colonoscopy in the past. I went over the colonoscopy procedure in detail with the patient, and he is agreeable to proceeding. - Colonoscopy [facility]. Will need to contact his cardiologist Dr. Burt Knack about holding his Plavix.  Christia Reading, MD

## 2022-06-11 ENCOUNTER — Encounter: Payer: 59 | Admitting: Internal Medicine

## 2022-09-10 ENCOUNTER — Other Ambulatory Visit: Payer: Self-pay | Admitting: Internal Medicine

## 2022-09-10 DIAGNOSIS — I10 Essential (primary) hypertension: Secondary | ICD-10-CM

## 2022-09-15 ENCOUNTER — Ambulatory Visit: Payer: BC Managed Care – PPO | Admitting: Internal Medicine

## 2022-09-15 ENCOUNTER — Encounter: Payer: Self-pay | Admitting: Internal Medicine

## 2022-09-15 VITALS — BP 124/72 | HR 79 | Temp 97.9°F | Ht 66.0 in | Wt 188.0 lb

## 2022-09-15 DIAGNOSIS — E785 Hyperlipidemia, unspecified: Secondary | ICD-10-CM

## 2022-09-15 DIAGNOSIS — R809 Proteinuria, unspecified: Secondary | ICD-10-CM

## 2022-09-15 DIAGNOSIS — E1129 Type 2 diabetes mellitus with other diabetic kidney complication: Secondary | ICD-10-CM

## 2022-09-15 DIAGNOSIS — I2510 Atherosclerotic heart disease of native coronary artery without angina pectoris: Secondary | ICD-10-CM

## 2022-09-15 DIAGNOSIS — I10 Essential (primary) hypertension: Secondary | ICD-10-CM

## 2022-09-15 DIAGNOSIS — E118 Type 2 diabetes mellitus with unspecified complications: Secondary | ICD-10-CM

## 2022-09-15 DIAGNOSIS — E119 Type 2 diabetes mellitus without complications: Secondary | ICD-10-CM | POA: Diagnosis not present

## 2022-09-15 DIAGNOSIS — Z23 Encounter for immunization: Secondary | ICD-10-CM

## 2022-09-15 DIAGNOSIS — E781 Pure hyperglyceridemia: Secondary | ICD-10-CM | POA: Diagnosis not present

## 2022-09-15 DIAGNOSIS — Z1211 Encounter for screening for malignant neoplasm of colon: Secondary | ICD-10-CM

## 2022-09-15 DIAGNOSIS — Z0000 Encounter for general adult medical examination without abnormal findings: Secondary | ICD-10-CM

## 2022-09-15 LAB — URINALYSIS, ROUTINE W REFLEX MICROSCOPIC
Bilirubin Urine: NEGATIVE
Hgb urine dipstick: NEGATIVE
Ketones, ur: NEGATIVE
Leukocytes,Ua: NEGATIVE
Nitrite: NEGATIVE
RBC / HPF: NONE SEEN (ref 0–?)
Specific Gravity, Urine: 1.015 (ref 1.000–1.030)
Total Protein, Urine: NEGATIVE
Urine Glucose: >=1000 — AB
Urobilinogen, UA: 0.2 (ref 0.0–1.0)
pH: 5.5 (ref 5.0–8.0)

## 2022-09-15 LAB — HEPATIC FUNCTION PANEL
ALT: 22 U/L (ref 0–53)
AST: 16 U/L (ref 0–37)
Albumin: 4.9 g/dL (ref 3.5–5.2)
Alkaline Phosphatase: 50 U/L (ref 39–117)
Bilirubin, Direct: 0.1 mg/dL (ref 0.0–0.3)
Total Bilirubin: 0.7 mg/dL (ref 0.2–1.2)
Total Protein: 7.4 g/dL (ref 6.0–8.3)

## 2022-09-15 LAB — BASIC METABOLIC PANEL
BUN: 16 mg/dL (ref 6–23)
CO2: 27 mEq/L (ref 19–32)
Calcium: 9.9 mg/dL (ref 8.4–10.5)
Chloride: 101 mEq/L (ref 96–112)
Creatinine, Ser: 0.78 mg/dL (ref 0.40–1.50)
GFR: 97.73 mL/min (ref >60.00)
Glucose, Bld: 132 mg/dL — ABNORMAL HIGH (ref 70–99)
Potassium: 3.9 mEq/L (ref 3.5–5.1)
Sodium: 138 mEq/L (ref 135–145)

## 2022-09-15 LAB — CBC WITH DIFFERENTIAL/PLATELET
Basophils Absolute: 0 10*3/uL (ref 0.0–0.1)
Basophils Relative: 0.5 % (ref 0.0–3.0)
Eosinophils Absolute: 0.2 10*3/uL (ref 0.0–0.7)
Eosinophils Relative: 2.3 % (ref 0.0–5.0)
HCT: 45.7 % (ref 39.0–52.0)
Hemoglobin: 15.2 g/dL (ref 13.0–17.0)
Lymphocytes Relative: 28.7 % (ref 12.0–46.0)
Lymphs Abs: 2.3 10*3/uL (ref 0.7–4.0)
MCHC: 33.3 g/dL (ref 30.0–36.0)
MCV: 78.6 fl (ref 78.0–100.0)
Monocytes Absolute: 0.5 10*3/uL (ref 0.1–1.0)
Monocytes Relative: 6.8 % (ref 3.0–12.0)
Neutro Abs: 4.9 10*3/uL (ref 1.4–7.7)
Neutrophils Relative %: 61.7 % (ref 43.0–77.0)
Platelets: 207 10*3/uL (ref 150.0–400.0)
RBC: 5.81 Mil/uL (ref 4.22–5.81)
RDW: 14.8 % (ref 11.5–15.5)
WBC: 8 10*3/uL (ref 4.0–10.5)

## 2022-09-15 LAB — LIPID PANEL
Cholesterol: 120 mg/dL (ref 0–200)
HDL: 35.6 mg/dL — ABNORMAL LOW (ref >39.00)
NonHDL: 84.14
Total CHOL/HDL Ratio: 3
Triglycerides: 302 mg/dL — ABNORMAL HIGH (ref 0.0–149.0)
VLDL: 60.4 mg/dL — ABNORMAL HIGH (ref 0.0–40.0)

## 2022-09-15 LAB — MICROALBUMIN / CREATININE URINE RATIO
Creatinine,U: 64.4 mg/dL
Microalb Creat Ratio: 9.4 mg/g (ref 0.0–30.0)
Microalb, Ur: 6.1 mg/dL — ABNORMAL HIGH (ref 0.0–1.9)

## 2022-09-15 LAB — HEMOGLOBIN A1C: Hgb A1c MFr Bld: 8.2 % — ABNORMAL HIGH (ref 4.6–6.5)

## 2022-09-15 LAB — PSA: PSA: 0.39 ng/mL (ref 0.10–4.00)

## 2022-09-15 LAB — LDL CHOLESTEROL, DIRECT: Direct LDL: 52 mg/dL

## 2022-09-15 LAB — TSH: TSH: 2 u[IU]/mL (ref 0.35–5.50)

## 2022-09-15 MED ORDER — RYBELSUS 14 MG PO TABS: 14.0000 mg | ORAL_TABLET | Freq: Every day | ORAL | 1 refills | Status: DC

## 2022-09-15 MED ORDER — OMEGA-3-ACID ETHYL ESTERS 1 G PO CAPS: ORAL_CAPSULE | ORAL | 1 refills | Status: DC

## 2022-09-15 MED ORDER — METFORMIN HCL ER 750 MG PO TB24: 1500.0000 mg | ORAL_TABLET | Freq: Every day | ORAL | 1 refills | Status: DC

## 2022-09-15 MED ORDER — DAPAGLIFLOZIN PROPANEDIOL 10 MG PO TABS: 10.0000 mg | ORAL_TABLET | Freq: Every day | ORAL | 1 refills | Status: DC

## 2022-09-15 MED ORDER — ROSUVASTATIN CALCIUM 40 MG PO TABS: 40.0000 mg | ORAL_TABLET | Freq: Every day | ORAL | 1 refills | Status: DC

## 2022-09-15 NOTE — Progress Notes (Signed)
Subjective:  Patient ID: [name], male    DOB: June 04, 1961  Age: 62 y.o. MRN: [id number]  CC: Annual Exam, Diabetes, Hypertension, and Hyperlipidemia   HPI [name] presents for a CPX and f/up ----  He is active and denies chest pain, shortness of breath, diaphoresis, or edema.  Outpatient Medications Prior to Visit  Medication Sig Dispense Refill   aspirin 81 MG tablet Take 81 mg by mouth daily.     Cholecalciferol (VITAMIN D3) 50 MCG (2000 UT) TABS Take 1 tablet by mouth daily.     clopidogrel (PLAVIX) 75 MG tablet TAKE 1 TABLET BY MOUTH EVERY DAY 90 tablet 0   Insulin Pen Needle 32G X 6 MM MISC 1 Act by Does not apply route daily. 100 each 1   metoprolol tartrate (LOPRESSOR) 25 MG tablet Take 1 tablet (25 mg total) by mouth 2 (two) times daily. 180 tablet 3   nitroGLYCERIN (NITROSTAT) 0.4 MG SL tablet PLACE 1 TABLET UNDER THE TONGUE EVERY 5 MINUTES AS NEEDED FOR CHEST PAIN. 25 tablet 3   dapagliflozin propanediol (FARXIGA) 10 MG TABS tablet Take 1 tablet (10 mg total) by mouth daily before breakfast. 90 tablet 1   metFORMIN (GLUCOPHAGE-XR) 750 MG 24 hr tablet TAKE 2 TABLETS (1,500 MG) BY MOUTH DAILY WITH BREAKFAST. 180 tablet 1   olmesartan (BENICAR) 20 MG tablet Take 1 tablet (20 mg total) by mouth daily. 90 tablet 1   omega-3 acid ethyl esters (LOVAZA) 1 g capsule TAKE 2 CAPSULES BY MOUTH 2 TIMES DAILY. 360 capsule 1   rosuvastatin (CRESTOR) 40 MG tablet Take 1 tablet (40 mg total) by mouth daily. 90 tablet 3   Semaglutide (RYBELSUS) 14 MG TABS Take 1 tablet (14 mg total) by mouth daily. 90 tablet 1   No facility-administered medications prior to visit.    ROS Review of Systems  Constitutional: Negative.  Negative for diaphoresis and fatigue.  HENT: Negative.    Eyes: Negative.   Respiratory: Negative.  Negative for cough, chest tightness, shortness of breath and wheezing.   Cardiovascular:  Negative for chest pain, palpitations and leg swelling.   Gastrointestinal:  Negative for abdominal pain, constipation, diarrhea, nausea and vomiting.  Endocrine: Negative.   Genitourinary: Negative.  Negative for difficulty urinating, dysuria and hematuria.  Musculoskeletal:  Negative for arthralgias and myalgias.  Skin: Negative.   Neurological:  Negative for dizziness, weakness, light-headedness and headaches.  Hematological:  Negative for adenopathy. Does not bruise/bleed easily.  Psychiatric/Behavioral: Negative.      Objective:  BP 124/72 (BP Location: Left Arm, Patient Position: Sitting, Cuff Size: Large)   Pulse 79   Temp 97.9 F (36.6 C) (Oral)   Ht 5\' 6"  (1.676 m)   Wt 188 lb (85.3 kg)   SpO2 90%   BMI 30.34 kg/m   BP Readings from Last 3 Encounters:  09/15/22 124/72  04/23/22 126/66  03/22/22 126/86    Wt Readings from Last 3 Encounters:  09/15/22 188 lb (85.3 kg)  04/23/22 186 lb (84.4 kg)  03/22/22 186 lb (84.4 kg)    Physical Exam Vitals reviewed.  Constitutional:      Appearance: Normal appearance.  HENT:     Nose: Nose normal.     Mouth/Throat:     Mouth: Mucous membranes are moist.  Eyes:     General: No scleral icterus.    Conjunctiva/sclera: Conjunctivae normal.  Cardiovascular:     Rate and Rhythm: Normal rate and regular rhythm.     Pulses:  Normal pulses.     Heart sounds: No murmur heard.    No gallop.  Pulmonary:     Effort: Pulmonary effort is normal.     Breath sounds: No stridor. No wheezing, rhonchi or rales.  Abdominal:     General: Abdomen is flat.     Palpations: There is no mass.     Tenderness: There is no abdominal tenderness. There is no guarding.     Hernia: No hernia is present. There is no hernia in the left inguinal area or right inguinal area.  Genitourinary:    Pubic Area: No rash.      Penis: Normal and circumcised.      Testes: Normal.     Epididymis:     Right: Normal.     Left: Normal.     Prostate: Normal. Not enlarged, not tender and no nodules present.      Rectum: Normal. Guaiac result negative. No mass, tenderness, anal fissure, external hemorrhoid or internal hemorrhoid. Normal anal tone.  Musculoskeletal:        General: Normal range of motion.     Cervical back: Neck supple.     Right lower leg: No edema.     Left lower leg: No edema.  Lymphadenopathy:     Cervical: No cervical adenopathy.     Lower Body: No right inguinal adenopathy. No left inguinal adenopathy.  Skin:    General: Skin is warm and dry.  Neurological:     General: No focal deficit present.     Mental Status: He is alert.  Psychiatric:        Mood and Affect: Mood normal.        Behavior: Behavior normal.     Lab Results  Component Value Date   WBC 8.0 09/15/2022   HGB 15.2 09/15/2022   HCT 45.7 09/15/2022   PLT 207.0 09/15/2022   GLUCOSE 132 (H) 09/15/2022   CHOL 120 09/15/2022   TRIG 302.0 (H) 09/15/2022   HDL 35.60 (L) 09/15/2022   LDLDIRECT 52.0 09/15/2022   LDLCALC 40 05/03/2019   ALT 22 09/15/2022   AST 16 09/15/2022   NA 138 09/15/2022   K 3.9 09/15/2022   CL 101 09/15/2022   CREATININE 0.78 09/15/2022   BUN 16 09/15/2022   CO2 27 09/15/2022   TSH 2.00 09/15/2022   PSA 0.39 09/15/2022   INR 0.9 ratio 04/22/2009   HGBA1C 8.2 (H) 09/15/2022   MICROALBUR 6.1 (H) 09/15/2022    DG Chest 2 View  Result Date: 08/08/2016 CLINICAL DATA:  Initial evaluation for acute right upper quadrant pain status post surgery. EXAM: CHEST  2 VIEW COMPARISON:  Prior radiograph from 07/10/2010. FINDINGS: The cardiac and mediastinal silhouettes are stable in size and contour, and remain within normal limits. Suspected underlying emphysema. No airspace consolidation, pleural effusion, or pulmonary edema is identified. There is no pneumothorax. No acute osseous abnormality identified. IMPRESSION: No active cardiopulmonary disease. Electronically Signed   By: Jeannine Boga M.D.   On: 08/08/2016 00:29   CT Abdomen Pelvis W Contrast  Result Date:  08/07/2016 CLINICAL DATA:  Right upper quadrant abdominal pain radiating to the back for 4 days. History of laparoscopic cholecystectomy and umbilical hernia repair 1 week ago. EXAM: CT ABDOMEN AND PELVIS WITH CONTRAST TECHNIQUE: Multidetector CT imaging of the abdomen and pelvis was performed using the standard protocol following bolus administration of intravenous contrast. CONTRAST:  178mL ISOVUE-300 IOPAMIDOL (ISOVUE-300) INJECTION 61% COMPARISON:  07/12/2016 FINDINGS: Lower chest: Minimal dependent  changes in the lung bases. Coronary artery calcifications. Hepatobiliary: Surgical absence of the gallbladder. No bile duct dilatation. No fluid collections or infiltrative changes demonstrated in the gallbladder fossa. Diffuse fatty infiltration of the liver. No focal liver lesions. Pancreas: Unremarkable. No pancreatic ductal dilatation or surrounding inflammatory changes. Spleen: Normal in size without focal abnormality. Adrenals/Urinary Tract: Adrenal glands are unremarkable. Kidneys are normal, without renal calculi, focal lesion, or hydronephrosis. Bladder is unremarkable. Stomach/Bowel: Stomach is within normal limits. Appendix appears normal. No evidence of bowel wall thickening, distention, or inflammatory changes. Vascular/Lymphatic: Aortic atherosclerosis. No enlarged abdominal or pelvic lymph nodes. Reproductive: Borderline prostate size at 3.8 cm diameter. Prostate calcifications are present. Other: There is focal infiltration in the anterior abdominal wall at the level of the umbilicus, likely representing postoperative change. No loculated fluid collections. No free fluid or free air in the abdomen. Abdominal wall musculature appears intact. Musculoskeletal: Degenerative changes in the spine. No destructive bone lesions. IMPRESSION: Postoperative cholecystectomy. No fluid or infiltration in the gallbladder fossa. No free fluid in the abdomen. Focal infiltration in the anterior abdominal wall around the  umbilical region is likely postoperative. No loculated collection to suggest abscess. Diffuse fatty infiltration of the liver. No evidence of bowel obstruction or inflammation. Electronically Signed   By: Lucienne Capers M.D.   On: 08/07/2016 22:46    Assessment & Plan:   Essential hypertension- His blood pressure is well-controlled. -     TSH; Future -     Urinalysis, Routine w reflex microscopic; Future -     Basic metabolic panel; Future -     CBC with Differential/Platelet; Future -     Olmesartan Medoxomil; Take 1 tablet (20 mg total) by mouth daily.  Dispense: 90 tablet; Refill: 1  Hyperlipidemia LDL goal <70 - LDL goal achieved. Doing well on the statin  -     Lipid panel; Future -     TSH; Future -     Hepatic function panel; Future -     Rosuvastatin Calcium; Take 1 tablet (40 mg total) by mouth daily.  Dispense: 90 tablet; Refill: 1  Microalbuminuria due to type 2 diabetes mellitus ([place])- Will continue to address risk factor modifications. -     Urinalysis, Routine w reflex microscopic; Future -     Microalbumin / creatinine urine ratio; Future -     Dapagliflozin Propanediol; Take 1 tablet (10 mg total) by mouth daily before breakfast.  Dispense: 90 tablet; Refill: 1 -     Olmesartan Medoxomil; Take 1 tablet (20 mg total) by mouth daily.  Dispense: 90 tablet; Refill: 1  Pure hyperglyceridemia -     Lipid panel; Future -     Omega-3-acid Ethyl Esters; TAKE 2 CAPSULES BY MOUTH 2 TIMES DAILY.  Dispense: 360 capsule; Refill: 1  Routine general medical examination at a health care facility- Exam completed, labs reviewed, vaccines reviewed and updated, cancer screenings addressed, patient education was given. -     PSA; Future  Type 2 diabetes mellitus without complication, without long-term current use of insulin (HCC) -     Urinalysis, Routine w reflex microscopic; Future -     CBC with Differential/Platelet; Future -     Hemoglobin A1c; Future -     Microalbumin /  creatinine urine ratio; Future  Type II diabetes mellitus with manifestations ([place])- His A1c has improved. -     Ambulatory referral to Ophthalmology -     HM Diabetes Foot Exam -  Dapagliflozin Propanediol; Take 1 tablet (10 mg total) by mouth daily before breakfast.  Dispense: 90 tablet; Refill: 1 -     Rybelsus; Take 1 tablet (14 mg total) by mouth daily.  Dispense: 90 tablet; Refill: 1 -     metFORMIN HCl ER; Take 2 tablets (1,500 mg total) by mouth daily with breakfast.  Dispense: 180 tablet; Refill: 1  Screen for colon cancer -     Cologuard  Coronary artery disease involving native coronary artery of native heart without angina pectoris  Other orders -     Tdap vaccine greater than or equal to 7yo IM -     LDL cholesterol, direct     Follow-up: No follow-ups on file.  Scarlette Calico, MD

## 2022-09-15 NOTE — Patient Instructions (Signed)
Health Maintenance, Male Adopting a healthy lifestyle and getting preventive care are important in promoting health and wellness. Ask your health care provider about: The right schedule for you to have regular tests and exams. Things you can do on your own to prevent diseases and keep yourself healthy. What should I know about diet, weight, and exercise? Eat a healthy diet  Eat a diet that includes plenty of vegetables, fruits, low-fat dairy products, and lean protein. Do not eat a lot of foods that are high in solid fats, added sugars, or sodium. Maintain a healthy weight Body mass index (BMI) is a measurement that can be used to identify possible weight problems. It estimates body fat based on height and weight. Your health care provider can help determine your BMI and help you achieve or maintain a healthy weight. Get regular exercise Get regular exercise. This is one of the most important things you can do for your health. Most adults should: Exercise for at least 150 minutes each week. The exercise should increase your heart rate and make you sweat (moderate-intensity exercise). Do strengthening exercises at least twice a week. This is in addition to the moderate-intensity exercise. Spend less time sitting. Even light physical activity can be beneficial. Watch cholesterol and blood lipids Have your blood tested for lipids and cholesterol at 60 years of age, then have this test every 5 years. You may need to have your cholesterol levels checked more often if: Your lipid or cholesterol levels are high. You are older than 60 years of age. You are at high risk for heart disease. What should I know about cancer screening? Many types of cancers can be detected early and may often be prevented. Depending on your health history and family history, you may need to have cancer screening at various ages. This may include screening for: Colorectal cancer. Prostate cancer. Skin cancer. Lung  cancer. What should I know about heart disease, diabetes, and high blood pressure? Blood pressure and heart disease High blood pressure causes heart disease and increases the risk of stroke. This is more likely to develop in people who have high blood pressure readings or are overweight. Talk with your health care provider about your target blood pressure readings. Have your blood pressure checked: Every 3-5 years if you are 18-39 years of age. Every year if you are 40 years old or older. If you are between the ages of 65 and 75 and are a current or former smoker, ask your health care provider if you should have a one-time screening for abdominal aortic aneurysm (AAA). Diabetes Have regular diabetes screenings. This checks your fasting blood sugar level. Have the screening done: Once every three years after age 45 if you are at a normal weight and have a low risk for diabetes. More often and at a younger age if you are overweight or have a high risk for diabetes. What should I know about preventing infection? Hepatitis B If you have a higher risk for hepatitis B, you should be screened for this virus. Talk with your health care provider to find out if you are at risk for hepatitis B infection. Hepatitis C Blood testing is recommended for: Everyone born from 1945 through 1965. Anyone with known risk factors for hepatitis C. Sexually transmitted infections (STIs) You should be screened each year for STIs, including gonorrhea and chlamydia, if: You are sexually active and are younger than 60 years of age. You are older than 60 years of age and your   health care provider tells you that you are at risk for this type of infection. Your sexual activity has changed since you were last screened, and you are at increased risk for chlamydia or gonorrhea. Ask your health care provider if you are at risk. Ask your health care provider about whether you are at high risk for HIV. Your health care provider  may recommend a prescription medicine to help prevent HIV infection. If you choose to take medicine to prevent HIV, you should first get tested for HIV. You should then be tested every 3 months for as long as you are taking the medicine. Follow these instructions at home: Alcohol use Do not drink alcohol if your health care provider tells you not to drink. If you drink alcohol: Limit how much you have to 0-2 drinks a day. Know how much alcohol is in your drink. In the [place] one drink equals one 12 oz bottle of beer (355 mL), one 5 oz glass of wine (148 mL), or one 1 oz glass of hard liquor (44 mL). Lifestyle Do not use any products that contain nicotine or tobacco. These products include cigarettes, chewing tobacco, and vaping devices, such as e-cigarettes. If you need help quitting, ask your health care provider. Do not use street drugs. Do not share needles. Ask your health care provider for help if you need support or information about quitting drugs. General instructions Schedule regular health, dental, and eye exams. Stay current with your vaccines. Tell your health care provider if: You often feel depressed. You have ever been abused or do not feel safe at home. Summary Adopting a healthy lifestyle and getting preventive care are important in promoting health and wellness. Follow your health care provider's instructions about healthy diet, exercising, and getting tested or screened for diseases. Follow your health care provider's instructions on monitoring your cholesterol and blood pressure. This information is not intended to replace advice given to you by your health care provider. Make sure you discuss any questions you have with your health care provider. Document Revised: 10/27/2020 Document Reviewed: 10/27/2020 Elsevier Patient Education  2023 Elsevier Inc.  

## 2022-09-17 MED ORDER — OLMESARTAN MEDOXOMIL 20 MG PO TABS: 20.0000 mg | ORAL_TABLET | Freq: Every day | ORAL | 1 refills | Status: DC

## 2022-09-21 ENCOUNTER — Telehealth: Payer: Self-pay | Admitting: Cardiovascular Disease

## 2022-09-21 ENCOUNTER — Other Ambulatory Visit: Payer: Self-pay | Admitting: Physician Assistant

## 2022-09-21 DIAGNOSIS — I2510 Atherosclerotic heart disease of native coronary artery without angina pectoris: Secondary | ICD-10-CM

## 2022-09-21 MED ORDER — CLOPIDOGREL BISULFATE 75 MG PO TABS: 75.0000 mg | ORAL_TABLET | Freq: Every day | ORAL | 4 refills | Status: DC

## 2022-09-21 NOTE — Telephone Encounter (Signed)
Pt's medication was sent to pt's pharmacy as requested. Confirmation received.  °

## 2022-09-21 NOTE — Telephone Encounter (Signed)
*  STAT* If patient is at the pharmacy, call can be transferred to refill team.   1. Which medications need to be refilled? (please list name of each medication and dose if known)   clopidogrel (PLAVIX) 75 MG tablet    2. Which pharmacy/location (including street and city if local pharmacy) is medication to be sent to?  [id number] [place], [place] - [address]    3. Do they need a 30 day or 90 day supply? 30 day   Pt has only 2 tablets left

## 2022-10-14 ENCOUNTER — Other Ambulatory Visit: Payer: Self-pay | Admitting: Cardiovascular Disease

## 2022-10-14 DIAGNOSIS — I2510 Atherosclerotic heart disease of native coronary artery without angina pectoris: Secondary | ICD-10-CM

## 2022-10-18 ENCOUNTER — Other Ambulatory Visit: Payer: Self-pay | Admitting: Internal Medicine

## 2022-10-18 ENCOUNTER — Telehealth: Payer: Self-pay

## 2022-10-18 DIAGNOSIS — E118 Type 2 diabetes mellitus with unspecified complications: Secondary | ICD-10-CM

## 2022-10-18 NOTE — Progress Notes (Signed)
   10/18/2022  Patient ID: [name], male   DOB: 1960/08/15, 62 y.o.   MRN: [id number]  Request from patient's PCP, Dr. Yetta Barre, to assist with medication access regarding patient's Rybelsus prescription.  The pharmacy had contacted the office requesting medication change due to failure of insurance to pay.    Contacted pharmacy to inform them we would pursue a prior authorization versus changing therapy.  Upon contacting insurance to start PA; I was informed pharmacy was processing script for 90 days, and they will only cover 30 days.  Requested that pharmacy change to 30 days and resubmit.  They were able to get a paid claim with $14 copay for patient but have to order the medication for tomorrow.    Made patient aware.  Lenna Gilford, PharmD, DPLA

## 2022-10-18 NOTE — Telephone Encounter (Signed)
"  Would you like to continue with Rybelsus and for me to initiate a PA?"  Yes, TY  TJ

## 2023-01-16 ENCOUNTER — Other Ambulatory Visit: Payer: Self-pay | Admitting: Cardiovascular Disease

## 2023-01-16 DIAGNOSIS — I2510 Atherosclerotic heart disease of native coronary artery without angina pectoris: Secondary | ICD-10-CM

## 2023-02-08 NOTE — Progress Notes (Unsigned)
Cardiology Office Note:    Date:  02/09/2023  ID:  [name], DOB 26-Apr-1961, MRN [id number] PCP: David Grandchild, MD  [facility] Providers Cardiologist:  David Bollman, MD       Patient Profile:      Coronary artery disease  Hx of ACS; s/p multiple PCI/stents [BMS to RCA, BMS to LCx, DES to RCA (inside BMS)] Long term DAPT S/p 2.75 x 33 mm Cypher DES to the RCA in 04/2009 LHC 04/25/2009: LM patent, distal LAD 40-50, D3 30; proximal LCx 30, distal LCx stent patent; RCA stent 50, 90 ISR, distal RCA stent patent, PDA 30, EF >55 Hypertension  Diabetes mellitus  Hyperlipidemia  OSA            Discussed the use of AI scribe software for clinical note transcription with the patient, who gave verbal consent to proceed.  History of Present Illness   [name], a 62 year old with a history of acute coronary syndrome (ACS), multiple percutaneous coronary interventions (PCI) with stent placements, hypertension, diabetes, hyperlipidemia, and sleep apnea, returns for a routine follow-up. His last PCI was in 2010, where a drug-eluting stent (Cypher) was placed in the right coronary artery (RCA) inside a previous bare-metal stent. He denies any new issues or concerns since his last visit a year ago. He remains active, primarily through walking, which he does for 30 minutes to 2 hours daily without any chest discomfort or shortness of breath. He also reports no difficulty with lifting heavy objects at work. He denies any dizziness, syncope, or difficulty breathing when lying flat.     ROS:  See HPI No claudication, melena, hematochezia, bruising.    Studies Reviewed:   EKG Interpretation Date/Time:  Wednesday February 09 2023 08:59:03 EDT Ventricular Rate:  70 PR Interval:  158 QRS Duration:  90 QT Interval:  386 QTC Calculation: 416 R Axis:   55  Text Interpretation: Normal sinus rhythm Normal axis No significant change since last tracing Confirmed by [name] [phone]) on  02/09/2023 9:06:45 AM    Risk Assessment/Calculations:             Physical Exam:   VS:  BP 110/70   Pulse 70   Ht 5\' 6"  (1.676 m)   Wt 182 lb 9.6 oz (82.8 kg)   SpO2 96%   BMI 29.47 kg/m    Wt Readings from Last 3 Encounters:  02/09/23 182 lb 9.6 oz (82.8 kg)  09/15/22 188 lb (85.3 kg)  04/23/22 186 lb (84.4 kg)    Constitutional:      Appearance: Healthy appearance. Not in distress.  Neck:     Vascular: No carotid bruit. JVD normal.  Pulmonary:     Breath sounds: Normal breath sounds. No wheezing. No rales.  Cardiovascular:     Normal rate. Regular rhythm.     Murmurs: There is no murmur.  Edema:    Peripheral edema absent.  Abdominal:     Palpations: Abdomen is soft.        Assessment and Plan:  CAD (coronary artery disease) History of acute coronary syndrome (ACS) and multiple percutaneous coronary interventions (PCI) with stents, last in 2010. No current symptoms of chest discomfort or shortness of breath with exertion or at rest. Medications include Aspirin 81mg  daily, Plavix 75mg  daily, Metoprolol 25mg  twice daily, Nitroglycerin as needed, Lovaza 2g twice daily, and Rosuvastatin 40mg  daily. -Continue current medications. -Return for follow-up in 1 year unless symptoms develop.  Essential hypertension Well controlled on  Metoprolol Tartrate 25mg  twice daily and Olmesartan 20mg  daily. -Continue current medications.  Hyperlipidemia LDL goal <70 LDL at goal (52), but elevated triglycerides (300) on Lovaza 2g twice daily and Rosuvastatin 40mg  daily. -Continue current medications -Continue to work on diet  Type II diabetes mellitus with manifestations (HCC) Last HbA1c 8.2. Managed by primary care.       Dispo:  Return in about 1 year (around 02/09/2024) for Routine Follow Up, w/ [name].  Signed, David Newcomer, PA-C

## 2023-02-09 ENCOUNTER — Ambulatory Visit: Payer: BC Managed Care – PPO | Attending: Physician Assistant | Admitting: Physician Assistant

## 2023-02-09 ENCOUNTER — Encounter: Payer: Self-pay | Admitting: Physician Assistant

## 2023-02-09 VITALS — BP 110/70 | HR 70 | Ht 66.0 in | Wt 182.6 lb

## 2023-02-09 DIAGNOSIS — I2510 Atherosclerotic heart disease of native coronary artery without angina pectoris: Secondary | ICD-10-CM

## 2023-02-09 DIAGNOSIS — I10 Essential (primary) hypertension: Secondary | ICD-10-CM

## 2023-02-09 DIAGNOSIS — E118 Type 2 diabetes mellitus with unspecified complications: Secondary | ICD-10-CM

## 2023-02-09 DIAGNOSIS — E781 Pure hyperglyceridemia: Secondary | ICD-10-CM | POA: Diagnosis not present

## 2023-02-09 DIAGNOSIS — E785 Hyperlipidemia, unspecified: Secondary | ICD-10-CM | POA: Diagnosis not present

## 2023-02-09 MED ORDER — NITROGLYCERIN 0.4 MG SL SUBL: 0.4000 mg | SUBLINGUAL_TABLET | SUBLINGUAL | 11 refills | Status: DC | PRN

## 2023-02-09 MED ORDER — CLOPIDOGREL BISULFATE 75 MG PO TABS: 75.0000 mg | ORAL_TABLET | Freq: Every day | ORAL | 3 refills | Status: DC

## 2023-02-09 MED ORDER — METOPROLOL TARTRATE 25 MG PO TABS: 25.0000 mg | ORAL_TABLET | Freq: Two times a day (BID) | ORAL | 3 refills | Status: DC

## 2023-02-09 NOTE — Assessment & Plan Note (Signed)
History of acute coronary syndrome (ACS) and multiple percutaneous coronary interventions (PCI) with stents, last in 2010. No current symptoms of chest discomfort or shortness of breath with exertion or at rest. Medications include Aspirin 81mg  daily, Plavix 75mg  daily, Metoprolol 25mg  twice daily, Nitroglycerin as needed, Lovaza 2g twice daily, and Rosuvastatin 40mg  daily. -Continue current medications. -Return for follow-up in 1 year unless symptoms develop.

## 2023-02-09 NOTE — Patient Instructions (Signed)
Medication Instructions:  No changes. Continue current medications. We sent refills in for: Metoprolol tartrate Nitroglycerin Clopidogrel (Plavix) *If you need a refill on your cardiac medications before your next appointment, please call your pharmacy*  Follow-Up: At [facility], you and your health needs are our priority.  As part of our continuing mission to provide you with exceptional heart care, we have created designated Provider Care Teams.  These Care Teams include your primary Cardiologist (physician) and Advanced Practice Providers (APPs -  Physician Assistants and Nurse Practitioners) who all work together to provide you with the care you need, when you need it.  We recommend signing up for the patient portal called "MyChart".  Sign up information is provided on this After Visit Summary.  MyChart is used to connect with patients for Virtual Visits (Telemedicine).  Patients are able to view lab/test results, encounter notes, upcoming appointments, etc.  Non-urgent messages can be sent to your provider as well.   To learn more about what you can do with MyChart, go to [email or web address].    Your next appointment:   12 month(s)  Provider:   Tonny Bollman, MD

## 2023-02-09 NOTE — Assessment & Plan Note (Signed)
Last HbA1c 8.2. Managed by primary care.

## 2023-02-09 NOTE — Assessment & Plan Note (Signed)
LDL at goal (52), but elevated triglycerides (300) on Lovaza 2g twice daily and Rosuvastatin 40mg  daily. -Continue current medications -Continue to work on diet

## 2023-02-09 NOTE — Assessment & Plan Note (Signed)
Well controlled on Metoprolol Tartrate 25mg  twice daily and Olmesartan 20mg  daily. -Continue current medications.

## 2023-05-11 ENCOUNTER — Other Ambulatory Visit: Payer: Self-pay | Admitting: Internal Medicine

## 2023-05-11 DIAGNOSIS — E1129 Type 2 diabetes mellitus with other diabetic kidney complication: Secondary | ICD-10-CM

## 2023-05-11 DIAGNOSIS — E118 Type 2 diabetes mellitus with unspecified complications: Secondary | ICD-10-CM

## 2023-06-05 ENCOUNTER — Other Ambulatory Visit: Payer: Self-pay | Admitting: Internal Medicine

## 2023-06-05 DIAGNOSIS — E118 Type 2 diabetes mellitus with unspecified complications: Secondary | ICD-10-CM

## 2023-06-05 DIAGNOSIS — E1129 Type 2 diabetes mellitus with other diabetic kidney complication: Secondary | ICD-10-CM

## 2023-06-25 DIAGNOSIS — E119 Type 2 diabetes mellitus without complications: Secondary | ICD-10-CM | POA: Diagnosis not present

## 2023-06-29 ENCOUNTER — Ambulatory Visit: Payer: BLUE CROSS/BLUE SHIELD | Admitting: Internal Medicine

## 2023-06-29 ENCOUNTER — Encounter: Payer: Self-pay | Admitting: Internal Medicine

## 2023-06-29 VITALS — BP 102/64 | HR 74 | Temp 98.0°F | Ht 66.0 in | Wt 188.0 lb

## 2023-06-29 DIAGNOSIS — Z23 Encounter for immunization: Secondary | ICD-10-CM

## 2023-06-29 DIAGNOSIS — Z7984 Long term (current) use of oral hypoglycemic drugs: Secondary | ICD-10-CM

## 2023-06-29 DIAGNOSIS — I10 Essential (primary) hypertension: Secondary | ICD-10-CM

## 2023-06-29 DIAGNOSIS — R809 Proteinuria, unspecified: Secondary | ICD-10-CM

## 2023-06-29 DIAGNOSIS — Z0000 Encounter for general adult medical examination without abnormal findings: Secondary | ICD-10-CM | POA: Diagnosis not present

## 2023-06-29 DIAGNOSIS — E1129 Type 2 diabetes mellitus with other diabetic kidney complication: Secondary | ICD-10-CM

## 2023-06-29 DIAGNOSIS — E118 Type 2 diabetes mellitus with unspecified complications: Secondary | ICD-10-CM | POA: Diagnosis not present

## 2023-06-29 DIAGNOSIS — E781 Pure hyperglyceridemia: Secondary | ICD-10-CM

## 2023-06-29 DIAGNOSIS — I2510 Atherosclerotic heart disease of native coronary artery without angina pectoris: Secondary | ICD-10-CM

## 2023-06-29 DIAGNOSIS — E785 Hyperlipidemia, unspecified: Secondary | ICD-10-CM

## 2023-06-29 MED ORDER — OMEGA-3-ACID ETHYL ESTERS 1 G PO CAPS: ORAL_CAPSULE | ORAL | 1 refills | Status: DC

## 2023-06-29 MED ORDER — NITROGLYCERIN 0.4 MG SL SUBL: 0.4000 mg | SUBLINGUAL_TABLET | SUBLINGUAL | 11 refills | Status: AC | PRN

## 2023-06-29 MED ORDER — METFORMIN HCL ER 750 MG PO TB24
1500.0000 mg | ORAL_TABLET | Freq: Every day | ORAL | 1 refills | Status: DC
Start: 2023-06-29 — End: 2023-10-02

## 2023-06-29 MED ORDER — DAPAGLIFLOZIN PROPANEDIOL 10 MG PO TABS
10.0000 mg | ORAL_TABLET | Freq: Every day | ORAL | 1 refills | Status: DC
Start: 2023-06-29 — End: 2023-10-02

## 2023-06-29 MED ORDER — OLMESARTAN MEDOXOMIL 20 MG PO TABS: 20.0000 mg | ORAL_TABLET | Freq: Every day | ORAL | 1 refills | Status: DC

## 2023-06-29 MED ORDER — ROSUVASTATIN CALCIUM 40 MG PO TABS: 40.0000 mg | ORAL_TABLET | Freq: Every day | ORAL | 1 refills | Status: DC

## 2023-06-29 MED ORDER — RYBELSUS 14 MG PO TABS
14.0000 mg | ORAL_TABLET | Freq: Every day | ORAL | 1 refills | Status: DC
Start: 2023-06-29 — End: 2023-10-02

## 2023-06-29 NOTE — Assessment & Plan Note (Signed)
 RTC in 3 mo to see Dr Yetta Barre w/labs prior Cont on Rybelsus, Metformin

## 2023-06-29 NOTE — Assessment & Plan Note (Signed)
 No angina NTG Rx reviewed

## 2023-06-29 NOTE — Assessment & Plan Note (Signed)
 Cont w/current BP meds

## 2023-06-29 NOTE — Progress Notes (Signed)
 Subjective:  Patient ID: [name], male    DOB: November 24, 1960  Age: 63 y.o. MRN: [id number]  CC: Medication Refill   HPI [name] presents for meds need to be refilled. F/u DM, HTN, CAD  Outpatient Medications Prior to Visit  Medication Sig Dispense Refill   aspirin  81 MG tablet Take 81 mg by mouth daily.     Cholecalciferol  (VITAMIN D3) 50 MCG (2000 UT) TABS Take 1 tablet by mouth daily.     clopidogrel  (PLAVIX ) 75 MG tablet Take 1 tablet (75 mg total) by mouth daily. 90 tablet 3   Insulin  Pen Needle 32G X 6 MM MISC 1 Act by Does not apply route daily. 100 each 1   metoprolol  tartrate (LOPRESSOR ) 25 MG tablet Take 1 tablet (25 mg total) by mouth 2 (two) times daily. 180 tablet 3   dapagliflozin  propanediol (FARXIGA ) 10 MG TABS tablet Take 1 tablet (10 mg total) by mouth daily before breakfast. 90 tablet 1   metFORMIN  (GLUCOPHAGE -XR) 750 MG 24 hr tablet Take 2 tablets (1,500 mg total) by mouth daily with breakfast. 180 tablet 1   nitroGLYCERIN  (NITROSTAT ) 0.4 MG SL tablet Place 1 tablet (0.4 mg total) under the tongue every 5 (five) minutes as needed for chest pain. 25 tablet 11   olmesartan  (BENICAR ) 20 MG tablet Take 1 tablet (20 mg total) by mouth daily. 90 tablet 1   omega-3 acid ethyl esters (LOVAZA ) 1 g capsule TAKE 2 CAPSULES BY MOUTH 2 TIMES DAILY. 360 capsule 1   rosuvastatin  (CRESTOR ) 40 MG tablet Take 1 tablet (40 mg total) by mouth daily. 90 tablet 1   Semaglutide  (RYBELSUS ) 14 MG TABS Take 1 tablet (14 mg total) by mouth daily. 90 tablet 1   No facility-administered medications prior to visit.    ROS: Review of Systems  Constitutional:  Negative for appetite change, fatigue and unexpected weight change.  HENT:  Negative for congestion, nosebleeds, sneezing, sore throat and trouble swallowing.   Eyes:  Negative for itching and visual disturbance.  Respiratory:  Negative for cough.   Cardiovascular:  Negative for chest pain, palpitations and leg swelling.   Gastrointestinal:  Negative for abdominal distention, blood in stool, diarrhea and nausea.  Genitourinary:  Negative for frequency and hematuria.  Musculoskeletal:  Negative for back pain, gait problem, joint swelling and neck pain.  Skin:  Negative for rash.  Neurological:  Negative for dizziness, tremors, speech difficulty and weakness.  Psychiatric/Behavioral:  Negative for agitation, dysphoric mood and sleep disturbance. The patient is not nervous/anxious.     Objective:  BP 102/64 (BP Location: Left Arm, Patient Position: Sitting, Cuff Size: Normal)   Pulse 74   Temp 98 F (36.7 C) (Oral)   Ht 5' 6 (1.676 m)   Wt 188 lb (85.3 kg)   SpO2 93%   BMI 30.34 kg/m   BP Readings from Last 3 Encounters:  06/29/23 102/64  02/09/23 110/70  09/15/22 124/72    Wt Readings from Last 3 Encounters:  06/29/23 188 lb (85.3 kg)  02/09/23 182 lb 9.6 oz (82.8 kg)  09/15/22 188 lb (85.3 kg)    Physical Exam Constitutional:      General: He is not in acute distress.    Appearance: He is well-developed. He is obese.     Comments: NAD  Eyes:     Conjunctiva/sclera: Conjunctivae normal.     Pupils: Pupils are equal, round, and reactive to light.  Neck:     Thyroid : No thyromegaly.  Vascular: No JVD.  Cardiovascular:     Rate and Rhythm: Normal rate and regular rhythm.     Heart sounds: Normal heart sounds. No murmur heard.    No friction rub. No gallop.  Pulmonary:     Effort: Pulmonary effort is normal. No respiratory distress.     Breath sounds: Normal breath sounds. No wheezing or rales.  Chest:     Chest wall: No tenderness.  Abdominal:     General: Bowel sounds are normal. There is no distension.     Palpations: Abdomen is soft. There is no mass.     Tenderness: There is no abdominal tenderness. There is no guarding or rebound.  Musculoskeletal:        General: No tenderness. Normal range of motion.     Cervical back: Normal range of motion.  Lymphadenopathy:      Cervical: No cervical adenopathy.  Skin:    General: Skin is warm and dry.     Findings: No rash.  Neurological:     Mental Status: He is alert and oriented to person, place, and time.     Cranial Nerves: No cranial nerve deficit.     Motor: No abnormal muscle tone.     Coordination: Coordination normal.     Gait: Gait normal.     Deep Tendon Reflexes: Reflexes are normal and symmetric.  Psychiatric:        Behavior: Behavior normal.        Thought Content: Thought content normal.        Judgment: Judgment normal.     Lab Results  Component Value Date   WBC 8.0 09/15/2022   HGB 15.2 09/15/2022   HCT 45.7 09/15/2022   PLT 207.0 09/15/2022   GLUCOSE 132 (H) 09/15/2022   CHOL 120 09/15/2022   TRIG 302.0 (H) 09/15/2022   HDL 35.60 (L) 09/15/2022   LDLDIRECT 52.0 09/15/2022   LDLCALC 40 05/03/2019   ALT 22 09/15/2022   AST 16 09/15/2022   NA 138 09/15/2022   K 3.9 09/15/2022   CL 101 09/15/2022   CREATININE 0.78 09/15/2022   BUN 16 09/15/2022   CO2 27 09/15/2022   TSH 2.00 09/15/2022   PSA 0.39 09/15/2022   INR 0.9 ratio 04/22/2009   HGBA1C 8.2 (H) 09/15/2022   MICROALBUR 6.1 (H) 09/15/2022    DG Chest 2 View Result Date: 08/08/2016 CLINICAL DATA:  Initial evaluation for acute right upper quadrant pain status post surgery. EXAM: CHEST  2 VIEW COMPARISON:  Prior radiograph from 07/10/2010. FINDINGS: The cardiac and mediastinal silhouettes are stable in size and contour, and remain within normal limits. Suspected underlying emphysema. No airspace consolidation, pleural effusion, or pulmonary edema is identified. There is no pneumothorax. No acute osseous abnormality identified. IMPRESSION: No active cardiopulmonary disease. Electronically Signed   By: Morene Hoard M.D.   On: 08/08/2016 00:29   CT Abdomen Pelvis W Contrast Result Date: 08/07/2016 CLINICAL DATA:  Right upper quadrant abdominal pain radiating to the back for 4 days. History of laparoscopic  cholecystectomy and umbilical hernia repair 1 week ago. EXAM: CT ABDOMEN AND PELVIS WITH CONTRAST TECHNIQUE: Multidetector CT imaging of the abdomen and pelvis was performed using the standard protocol following bolus administration of intravenous contrast. CONTRAST:  100mL ISOVUE -300 IOPAMIDOL  (ISOVUE -300) INJECTION 61% COMPARISON:  07/12/2016 FINDINGS: Lower chest: Minimal dependent changes in the lung bases. Coronary artery calcifications. Hepatobiliary: Surgical absence of the gallbladder. No bile duct dilatation. No fluid collections or infiltrative changes demonstrated in  the gallbladder fossa. Diffuse fatty infiltration of the liver. No focal liver lesions. Pancreas: Unremarkable. No pancreatic ductal dilatation or surrounding inflammatory changes. Spleen: Normal in size without focal abnormality. Adrenals/Urinary Tract: Adrenal glands are unremarkable. Kidneys are normal, without renal calculi, focal lesion, or hydronephrosis. Bladder is unremarkable. Stomach/Bowel: Stomach is within normal limits. Appendix appears normal. No evidence of bowel wall thickening, distention, or inflammatory changes. Vascular/Lymphatic: Aortic atherosclerosis. No enlarged abdominal or pelvic lymph nodes. Reproductive: Borderline prostate size at 3.8 cm diameter. Prostate calcifications are present. Other: There is focal infiltration in the anterior abdominal wall at the level of the umbilicus, likely representing postoperative change. No loculated fluid collections. No free fluid or free air in the abdomen. Abdominal wall musculature appears intact. Musculoskeletal: Degenerative changes in the spine. No destructive bone lesions. IMPRESSION: Postoperative cholecystectomy. No fluid or infiltration in the gallbladder fossa. No free fluid in the abdomen. Focal infiltration in the anterior abdominal wall around the umbilical region is likely postoperative. No loculated collection to suggest abscess. Diffuse fatty infiltration of the  liver. No evidence of bowel obstruction or inflammation. Electronically Signed   By: Elsie Gravely M.D.   On: 08/07/2016 22:46    Assessment & Plan:   Problem List Items Addressed This Visit     Hyperlipidemia LDL goal <70   Relevant Medications   nitroGLYCERIN  (NITROSTAT ) 0.4 MG SL tablet   olmesartan  (BENICAR ) 20 MG tablet   omega-3 acid ethyl esters (LOVAZA ) 1 g capsule   rosuvastatin  (CRESTOR ) 40 MG tablet   Essential hypertension   Cont w/current BP meds      Relevant Medications   nitroGLYCERIN  (NITROSTAT ) 0.4 MG SL tablet   olmesartan  (BENICAR ) 20 MG tablet   omega-3 acid ethyl esters (LOVAZA ) 1 g capsule   rosuvastatin  (CRESTOR ) 40 MG tablet   CAD (coronary artery disease)   No angina NTG Rx reviewed      Relevant Medications   nitroGLYCERIN  (NITROSTAT ) 0.4 MG SL tablet   olmesartan  (BENICAR ) 20 MG tablet   omega-3 acid ethyl esters (LOVAZA ) 1 g capsule   rosuvastatin  (CRESTOR ) 40 MG tablet   Type II diabetes mellitus with manifestations (HCC)   RTC in 3 mo to see Dr Joshua w/labs prior Cont on Rybelsus , Metformin       Relevant Medications   dapagliflozin  propanediol (FARXIGA ) 10 MG TABS tablet   metFORMIN  (GLUCOPHAGE -XR) 750 MG 24 hr tablet   Semaglutide  (RYBELSUS ) 14 MG TABS   olmesartan  (BENICAR ) 20 MG tablet   rosuvastatin  (CRESTOR ) 40 MG tablet   Other Relevant Orders   Hemoglobin A1c   Microalbumin / creatinine urine ratio   Pure hyperglyceridemia   Relevant Medications   nitroGLYCERIN  (NITROSTAT ) 0.4 MG SL tablet   olmesartan  (BENICAR ) 20 MG tablet   omega-3 acid ethyl esters (LOVAZA ) 1 g capsule   rosuvastatin  (CRESTOR ) 40 MG tablet   Microalbuminuria due to type 2 diabetes mellitus (HCC)   Relevant Medications   dapagliflozin  propanediol (FARXIGA ) 10 MG TABS tablet   metFORMIN  (GLUCOPHAGE -XR) 750 MG 24 hr tablet   Semaglutide  (RYBELSUS ) 14 MG TABS   olmesartan  (BENICAR ) 20 MG tablet   rosuvastatin  (CRESTOR ) 40 MG tablet   Other Visit  Diagnoses       Well adult exam    -  Primary   Relevant Orders   TSH   Urinalysis   CBC with Differential/Platelet   Lipid panel   PSA   Comprehensive metabolic panel         Meds ordered  this encounter  Medications   dapagliflozin  propanediol (FARXIGA ) 10 MG TABS tablet    Sig: Take 1 tablet (10 mg total) by mouth daily before breakfast.    Dispense:  90 tablet    Refill:  1   metFORMIN  (GLUCOPHAGE -XR) 750 MG 24 hr tablet    Sig: Take 2 tablets (1,500 mg total) by mouth daily with breakfast.    Dispense:  180 tablet    Refill:  1   nitroGLYCERIN  (NITROSTAT ) 0.4 MG SL tablet    Sig: Place 1 tablet (0.4 mg total) under the tongue every 5 (five) minutes as needed for chest pain.    Dispense:  25 tablet    Refill:  11   Semaglutide  (RYBELSUS ) 14 MG TABS    Sig: Take 1 tablet (14 mg total) by mouth daily.    Dispense:  90 tablet    Refill:  1   olmesartan  (BENICAR ) 20 MG tablet    Sig: Take 1 tablet (20 mg total) by mouth daily.    Dispense:  90 tablet    Refill:  1   omega-3 acid ethyl esters (LOVAZA ) 1 g capsule    Sig: TAKE 2 CAPSULES BY MOUTH 2 TIMES DAILY.    Dispense:  360 capsule    Refill:  1   rosuvastatin  (CRESTOR ) 40 MG tablet    Sig: Take 1 tablet (40 mg total) by mouth daily.    Dispense:  90 tablet    Refill:  1      Follow-up: Return in about 3 months (around 09/27/2023) for f/u with PCP.  Marolyn Noel, MD

## 2023-09-28 ENCOUNTER — Encounter: Payer: Self-pay | Admitting: Internal Medicine

## 2023-09-28 ENCOUNTER — Ambulatory Visit: Payer: BC Managed Care – PPO | Admitting: Internal Medicine

## 2023-09-28 VITALS — BP 138/76 | HR 69 | Temp 97.8°F | Resp 16 | Ht 66.0 in | Wt 187.0 lb

## 2023-09-28 DIAGNOSIS — Z0000 Encounter for general adult medical examination without abnormal findings: Secondary | ICD-10-CM

## 2023-09-28 DIAGNOSIS — E118 Type 2 diabetes mellitus with unspecified complications: Secondary | ICD-10-CM | POA: Diagnosis not present

## 2023-09-28 DIAGNOSIS — Z125 Encounter for screening for malignant neoplasm of prostate: Secondary | ICD-10-CM | POA: Diagnosis not present

## 2023-09-28 DIAGNOSIS — E785 Hyperlipidemia, unspecified: Secondary | ICD-10-CM

## 2023-09-28 DIAGNOSIS — I2510 Atherosclerotic heart disease of native coronary artery without angina pectoris: Secondary | ICD-10-CM | POA: Diagnosis not present

## 2023-09-28 DIAGNOSIS — I10 Essential (primary) hypertension: Secondary | ICD-10-CM | POA: Diagnosis not present

## 2023-09-28 DIAGNOSIS — E781 Pure hyperglyceridemia: Secondary | ICD-10-CM

## 2023-09-28 DIAGNOSIS — Z0001 Encounter for general adult medical examination with abnormal findings: Secondary | ICD-10-CM | POA: Insufficient documentation

## 2023-09-28 DIAGNOSIS — R809 Proteinuria, unspecified: Secondary | ICD-10-CM | POA: Diagnosis not present

## 2023-09-28 DIAGNOSIS — Z1211 Encounter for screening for malignant neoplasm of colon: Secondary | ICD-10-CM | POA: Insufficient documentation

## 2023-09-28 DIAGNOSIS — E1129 Type 2 diabetes mellitus with other diabetic kidney complication: Secondary | ICD-10-CM

## 2023-09-28 LAB — BASIC METABOLIC PANEL WITH GFR
BUN: 13 mg/dL (ref 6–23)
CO2: 29 meq/L (ref 19–32)
Calcium: 9.9 mg/dL (ref 8.4–10.5)
Chloride: 103 meq/L (ref 96–112)
Creatinine, Ser: 0.8 mg/dL (ref 0.40–1.50)
GFR: 96.28 mL/min (ref >60.00)
Glucose, Bld: 211 mg/dL — ABNORMAL HIGH (ref 70–99)
Potassium: 4.3 meq/L (ref 3.5–5.1)
Sodium: 141 meq/L (ref 135–145)

## 2023-09-28 LAB — PSA: PSA: 0.43 ng/mL (ref 0.10–4.00)

## 2023-09-28 LAB — CBC WITH DIFFERENTIAL/PLATELET
Basophils Absolute: 0 10*3/uL (ref 0.0–0.1)
Basophils Relative: 0.5 % (ref 0.0–3.0)
Eosinophils Absolute: 0.2 10*3/uL (ref 0.0–0.7)
Eosinophils Relative: 2.2 % (ref 0.0–5.0)
HCT: 44.8 % (ref 39.0–52.0)
Hemoglobin: 14.9 g/dL (ref 13.0–17.0)
Lymphocytes Relative: 24.4 % (ref 12.0–46.0)
Lymphs Abs: 2 10*3/uL (ref 0.7–4.0)
MCHC: 33.2 g/dL (ref 30.0–36.0)
MCV: 80.1 fl (ref 78.0–100.0)
Monocytes Absolute: 0.5 10*3/uL (ref 0.1–1.0)
Monocytes Relative: 6.3 % (ref 3.0–12.0)
Neutro Abs: 5.4 10*3/uL (ref 1.4–7.7)
Neutrophils Relative %: 66.6 % (ref 43.0–77.0)
Platelets: 218 10*3/uL (ref 150.0–400.0)
RBC: 5.59 Mil/uL (ref 4.22–5.81)
RDW: 14.7 % (ref 11.5–15.5)
WBC: 8 10*3/uL (ref 4.0–10.5)

## 2023-09-28 LAB — URINALYSIS, ROUTINE W REFLEX MICROSCOPIC
Bilirubin Urine: NEGATIVE
Hgb urine dipstick: NEGATIVE
Ketones, ur: NEGATIVE
Leukocytes,Ua: NEGATIVE
Nitrite: NEGATIVE
Specific Gravity, Urine: 1.015 (ref 1.000–1.030)
Total Protein, Urine: NEGATIVE
Urine Glucose: >=1000 — AB
Urobilinogen, UA: 0.2 (ref 0.0–1.0)
pH: 6 (ref 5.0–8.0)

## 2023-09-28 LAB — LIPID PANEL
Cholesterol: 102 mg/dL (ref 0–200)
HDL: 33.3 mg/dL — ABNORMAL LOW (ref >39.00)
LDL Cholesterol: 15 mg/dL (ref 0–99)
NonHDL: 68.71
Total CHOL/HDL Ratio: 3
Triglycerides: 269 mg/dL — ABNORMAL HIGH (ref 0.0–149.0)
VLDL: 53.8 mg/dL — ABNORMAL HIGH (ref 0.0–40.0)

## 2023-09-28 LAB — HEPATIC FUNCTION PANEL
ALT: 35 U/L (ref 0–53)
AST: 18 U/L (ref 0–37)
Albumin: 4.9 g/dL (ref 3.5–5.2)
Alkaline Phosphatase: 49 U/L (ref 39–117)
Bilirubin, Direct: 0.1 mg/dL (ref 0.0–0.3)
Total Bilirubin: 0.7 mg/dL (ref 0.2–1.2)
Total Protein: 7.2 g/dL (ref 6.0–8.3)

## 2023-09-28 LAB — MICROALBUMIN / CREATININE URINE RATIO
Creatinine,U: 46.8 mg/dL
Microalb Creat Ratio: 146.3 mg/g — ABNORMAL HIGH (ref 0.0–30.0)
Microalb, Ur: 6.8 mg/dL — ABNORMAL HIGH (ref 0.0–1.9)

## 2023-09-28 LAB — TSH: TSH: 2.26 u[IU]/mL (ref 0.35–5.50)

## 2023-09-28 LAB — HEMOGLOBIN A1C: Hgb A1c MFr Bld: 9 % — ABNORMAL HIGH (ref 4.6–6.5)

## 2023-09-28 NOTE — Patient Instructions (Signed)
 Health Maintenance, Male  Adopting a healthy lifestyle and getting preventive care are important in promoting health and wellness. Ask your health care provider about:  The right schedule for you to have regular tests and exams.  Things you can do on your own to prevent diseases and keep yourself healthy.  What should I know about diet, weight, and exercise?  Eat a healthy diet    Eat a diet that includes plenty of vegetables, fruits, low-fat dairy products, and lean protein.  Do not eat a lot of foods that are high in solid fats, added sugars, or sodium.  Maintain a healthy weight  Body mass index (BMI) is a measurement that can be used to identify possible weight problems. It estimates body fat based on height and weight. Your health care provider can help determine your BMI and help you achieve or maintain a healthy weight.  Get regular exercise  Get regular exercise. This is one of the most important things you can do for your health. Most adults should:  Exercise for at least 150 minutes each week. The exercise should increase your heart rate and make you sweat (moderate-intensity exercise).  Do strengthening exercises at least twice a week. This is in addition to the moderate-intensity exercise.  Spend less time sitting. Even light physical activity can be beneficial.  Watch cholesterol and blood lipids  Have your blood tested for lipids and cholesterol at 61 years of age, then have this test every 5 years.  You may need to have your cholesterol levels checked more often if:  Your lipid or cholesterol levels are high.  You are older than 61 years of age.  You are at high risk for heart disease.  What should I know about cancer screening?  Many types of cancers can be detected early and may often be prevented. Depending on your health history and family history, you may need to have cancer screening at various ages. This may include screening for:  Colorectal cancer.  Prostate cancer.  Skin cancer.  Lung  cancer.  What should I know about heart disease, diabetes, and high blood pressure?  Blood pressure and heart disease  High blood pressure causes heart disease and increases the risk of stroke. This is more likely to develop in people who have high blood pressure readings or are overweight.  Talk with your health care provider about your target blood pressure readings.  Have your blood pressure checked:  Every 3-5 years if you are 9-95 years of age.  Every year if you are 85 years old or older.  If you are between the ages of 29 and 29 and are a current or former smoker, ask your health care provider if you should have a one-time screening for abdominal aortic aneurysm (AAA).  Diabetes  Have regular diabetes screenings. This checks your fasting blood sugar level. Have the screening done:  Once every three years after age 23 if you are at a normal weight and have a low risk for diabetes.  More often and at a younger age if you are overweight or have a high risk for diabetes.  What should I know about preventing infection?  Hepatitis B  If you have a higher risk for hepatitis B, you should be screened for this virus. Talk with your health care provider to find out if you are at risk for hepatitis B infection.  Hepatitis C  Blood testing is recommended for:  Everyone born from [birth year] through 1963.  Anyone  with known risk factors for hepatitis C.  Sexually transmitted infections (STIs)  You should be screened each year for STIs, including gonorrhea and chlamydia, if:  You are sexually active and are younger than 62 years of age.  You are older than 61 years of age and your health care provider tells you that you are at risk for this type of infection.  Your sexual activity has changed since you were last screened, and you are at increased risk for chlamydia or gonorrhea. Ask your health care provider if you are at risk.  Ask your health care provider about whether you are at high risk for HIV. Your health care provider  may recommend a prescription medicine to help prevent HIV infection. If you choose to take medicine to prevent HIV, you should first get tested for HIV. You should then be tested every 3 months for as long as you are taking the medicine.  Follow these instructions at home:  Alcohol use  Do not drink alcohol if your health care provider tells you not to drink.  If you drink alcohol:  Limit how much you have to 0-2 drinks a day.  Know how much alcohol is in your drink. In the [place] one drink equals one 12 oz bottle of beer (355 mL), one 5 oz glass of wine (148 mL), or one 1 oz glass of hard liquor (44 mL).  Lifestyle  Do not use any products that contain nicotine or tobacco. These products include cigarettes, chewing tobacco, and vaping devices, such as e-cigarettes. If you need help quitting, ask your health care provider.  Do not use street drugs.  Do not share needles.  Ask your health care provider for help if you need support or information about quitting drugs.  General instructions  Schedule regular health, dental, and eye exams.  Stay current with your vaccines.  Tell your health care provider if:  You often feel depressed.  You have ever been abused or do not feel safe at home.  Summary  Adopting a healthy lifestyle and getting preventive care are important in promoting health and wellness.  Follow your health care provider's instructions about healthy diet, exercising, and getting tested or screened for diseases.  Follow your health care provider's instructions on monitoring your cholesterol and blood pressure.  This information is not intended to replace advice given to you by your health care provider. Make sure you discuss any questions you have with your health care provider.  Document Revised: 10/27/2020 Document Reviewed: 10/27/2020  Elsevier Patient Education  2024 [employer].

## 2023-09-28 NOTE — Progress Notes (Signed)
 Subjective:  Patient ID: [name], male    DOB: 18-Feb-1961  Age: 63 y.o. MRN: [id number]  CC: Annual Exam, Hypertension, Diabetes, and Hyperlipidemia   HPI [name] presents for a CPX and f/up ----  Discussed the use of AI scribe software for clinical note transcription with the patient, who gave verbal consent to proceed.  History of Present Illness   David Archila "Clayborne Cunning" is a 63 year old male who presents for a routine head-to-toe checkup.  He feels great and remains active without any symptoms of chest pain, shortness of breath, dizziness, or lightheadedness. He has not experienced any episodes of presyncope.  He reports persistent thirst and keeps water on hand at all times. There are no other symptoms of hyperglycemia or hypoglycemia, such as excessive urination, and no changes in medications since the last visit.       Outpatient Medications Prior to Visit  Medication Sig Dispense Refill   Cholecalciferol (VITAMIN D3) 50 MCG (2000 UT) TABS Take 1 tablet by mouth daily.     clopidogrel (PLAVIX) 75 MG tablet Take 1 tablet (75 mg total) by mouth daily. 90 tablet 3   Insulin Pen Needle 32G X 6 MM MISC 1 Act by Does not apply route daily. 100 each 1   metoprolol tartrate (LOPRESSOR) 25 MG tablet Take 1 tablet (25 mg total) by mouth 2 (two) times daily. 180 tablet 3   nitroGLYCERIN (NITROSTAT) 0.4 MG SL tablet Place 1 tablet (0.4 mg total) under the tongue every 5 (five) minutes as needed for chest pain. 25 tablet 11   olmesartan (BENICAR) 20 MG tablet Take 1 tablet (20 mg total) by mouth daily. 90 tablet 1   aspirin 81 MG tablet Take 81 mg by mouth daily.     dapagliflozin propanediol (FARXIGA) 10 MG TABS tablet Take 1 tablet (10 mg total) by mouth daily before breakfast. 90 tablet 1   metFORMIN (GLUCOPHAGE-XR) 750 MG 24 hr tablet Take 2 tablets (1,500 mg total) by mouth daily with breakfast. 180 tablet 1   omega-3 acid ethyl esters (LOVAZA) 1 g capsule TAKE 2 CAPSULES  BY MOUTH 2 TIMES DAILY. 360 capsule 1   rosuvastatin (CRESTOR) 40 MG tablet Take 1 tablet (40 mg total) by mouth daily. 90 tablet 1   Semaglutide (RYBELSUS) 14 MG TABS Take 1 tablet (14 mg total) by mouth daily. 90 tablet 1   No facility-administered medications prior to visit.    ROS Review of Systems  Constitutional:  Positive for unexpected weight change (wt gain). Negative for appetite change, chills, diaphoresis and fatigue.  HENT: Negative.    Respiratory: Negative.  Negative for cough, chest tightness, shortness of breath and wheezing.   Cardiovascular:  Negative for chest pain, palpitations and leg swelling.  Gastrointestinal:  Negative for abdominal pain, constipation, diarrhea, nausea and vomiting.  Endocrine: Positive for polydipsia. Negative for polyphagia and polyuria.  Genitourinary: Negative.   Musculoskeletal:  Negative for arthralgias and myalgias.  Skin: Negative.   Neurological:  Negative for dizziness and weakness.  Hematological:  Negative for adenopathy. Does not bruise/bleed easily.  Psychiatric/Behavioral: Negative.      Objective:  BP 138/76 (BP Location: Left Arm, Patient Position: Sitting)   Pulse 69   Temp 97.8 F (36.6 C) (Oral)   Resp 16   Ht 5\' 6"  (1.676 m)   Wt 187 lb (84.8 kg)   SpO2 97%   BMI 30.18 kg/m   BP Readings from Last 3 Encounters:  09/28/23 138/76  06/29/23 102/64  02/09/23 110/70    Wt Readings from Last 3 Encounters:  09/28/23 187 lb (84.8 kg)  06/29/23 188 lb (85.3 kg)  02/09/23 182 lb 9.6 oz (82.8 kg)    Physical Exam Vitals reviewed.  Constitutional:      Appearance: Normal appearance.  HENT:     Nose: Nose normal.     Mouth/Throat:     Mouth: Mucous membranes are moist.  Eyes:     General: No scleral icterus.    Conjunctiva/sclera: Conjunctivae normal.  Cardiovascular:     Rate and Rhythm: Normal rate and regular rhythm.     Heart sounds: Normal heart sounds, S1 normal and S2 normal. No murmur heard.    No  friction rub. No gallop.     Comments: EKG-- NSR, 75 bpm TWI in III is new No LVH or Q waves  Pulmonary:     Effort: Pulmonary effort is normal.     Breath sounds: No stridor. No wheezing, rhonchi or rales.  Abdominal:     General: Abdomen is flat.     Palpations: There is no mass.     Tenderness: There is no abdominal tenderness. There is no guarding.     Hernia: No hernia is present. There is no hernia in the left inguinal area or right inguinal area.  Genitourinary:    Pubic Area: No rash.      Penis: Normal and circumcised.      Testes: Normal.     Epididymis:     Right: Normal.     Left: Normal.     Prostate: Normal. Not enlarged, not tender and no nodules present.     Rectum: Normal. Guaiac result negative. No mass, tenderness, anal fissure, external hemorrhoid or internal hemorrhoid. Normal anal tone.  Musculoskeletal:     Cervical back: Neck supple.     Right lower leg: No edema.     Left lower leg: No edema.  Lymphadenopathy:     Lower Body: No right inguinal adenopathy. No left inguinal adenopathy.  Skin:    General: Skin is warm and dry.  Neurological:     General: No focal deficit present.     Mental Status: He is alert.  Psychiatric:        Mood and Affect: Mood normal.        Behavior: Behavior normal.     Lab Results  Component Value Date   WBC 8.0 09/28/2023   HGB 14.9 09/28/2023   HCT 44.8 09/28/2023   PLT 218.0 09/28/2023   GLUCOSE 211 (H) 09/28/2023   CHOL 102 09/28/2023   TRIG 269.0 (H) 09/28/2023   HDL 33.30 (L) 09/28/2023   LDLDIRECT 52.0 09/15/2022   LDLCALC 15 09/28/2023   ALT 35 09/28/2023   AST 18 09/28/2023   NA 141 09/28/2023   K 4.3 09/28/2023   CL 103 09/28/2023   CREATININE 0.80 09/28/2023   BUN 13 09/28/2023   CO2 29 09/28/2023   TSH 2.26 09/28/2023   PSA 0.43 09/28/2023   INR 0.9 ratio 04/22/2009   HGBA1C 9.0 (H) 09/28/2023   MICROALBUR 6.8 (H) 09/28/2023    DG Chest 2 View Result Date: 08/08/2016 CLINICAL DATA:   Initial evaluation for acute right upper quadrant pain status post surgery. EXAM: CHEST  2 VIEW COMPARISON:  Prior radiograph from 07/10/2010. FINDINGS: The cardiac and mediastinal silhouettes are stable in size and contour, and remain within normal limits. Suspected underlying emphysema. No airspace consolidation, pleural effusion, or pulmonary edema  is identified. There is no pneumothorax. No acute osseous abnormality identified. IMPRESSION: No active cardiopulmonary disease. Electronically Signed   By: Virgia Griffins M.D.   On: 08/08/2016 00:29   CT Abdomen Pelvis W Contrast Result Date: 08/07/2016 CLINICAL DATA:  Right upper quadrant abdominal pain radiating to the back for 4 days. History of laparoscopic cholecystectomy and umbilical hernia repair 1 week ago. EXAM: CT ABDOMEN AND PELVIS WITH CONTRAST TECHNIQUE: Multidetector CT imaging of the abdomen and pelvis was performed using the standard protocol following bolus administration of intravenous contrast. CONTRAST:  100mL ISOVUE-300 IOPAMIDOL (ISOVUE-300) INJECTION 61% COMPARISON:  07/12/2016 FINDINGS: Lower chest: Minimal dependent changes in the lung bases. Coronary artery calcifications. Hepatobiliary: Surgical absence of the gallbladder. No bile duct dilatation. No fluid collections or infiltrative changes demonstrated in the gallbladder fossa. Diffuse fatty infiltration of the liver. No focal liver lesions. Pancreas: Unremarkable. No pancreatic ductal dilatation or surrounding inflammatory changes. Spleen: Normal in size without focal abnormality. Adrenals/Urinary Tract: Adrenal glands are unremarkable. Kidneys are normal, without renal calculi, focal lesion, or hydronephrosis. Bladder is unremarkable. Stomach/Bowel: Stomach is within normal limits. Appendix appears normal. No evidence of bowel wall thickening, distention, or inflammatory changes. Vascular/Lymphatic: Aortic atherosclerosis. No enlarged abdominal or pelvic lymph nodes.  Reproductive: Borderline prostate size at 3.8 cm diameter. Prostate calcifications are present. Other: There is focal infiltration in the anterior abdominal wall at the level of the umbilicus, likely representing postoperative change. No loculated fluid collections. No free fluid or free air in the abdomen. Abdominal wall musculature appears intact. Musculoskeletal: Degenerative changes in the spine. No destructive bone lesions. IMPRESSION: Postoperative cholecystectomy. No fluid or infiltration in the gallbladder fossa. No free fluid in the abdomen. Focal infiltration in the anterior abdominal wall around the umbilical region is likely postoperative. No loculated collection to suggest abscess. Diffuse fatty infiltration of the liver. No evidence of bowel obstruction or inflammation. Electronically Signed   By: Boyce Byes M.D.   On: 08/07/2016 22:46    Assessment & Plan:   Hyperlipidemia LDL goal <70- LDL goal achieved. Doing well on the statin  -     Lipid panel; Future -     Hepatic function panel; Future -     Rosuvastatin Calcium; Take 1 tablet (40 mg total) by mouth daily.  Dispense: 90 tablet; Refill: 1  Essential hypertension- BP is well controlled. EKG is negative for LVH. -     Basic metabolic panel with GFR; Future -     CBC with Differential/Platelet; Future -     Urinalysis, Routine w reflex microscopic; Future -     TSH; Future -     EKG 12-Lead  Type II diabetes mellitus with manifestations (HCC)- He has not achieved his A1C goal. He is working on his lifestyle modifications. -     Microalbumin / creatinine urine ratio; Future -     Basic metabolic panel with GFR; Future -     Hemoglobin A1c; Future -     Urinalysis, Routine w reflex microscopic; Future -     HM Diabetes Foot Exam -     Ambulatory referral to Ophthalmology -     metFORMIN HCl ER; Take 2 tablets (1,500 mg total) by mouth daily with breakfast.  Dispense: 180 tablet; Refill: 1 -     Rybelsus; Take 1 tablet  (14 mg total) by mouth daily.  Dispense: 90 tablet; Refill: 1 -     Dapagliflozin Propanediol; Take 1 tablet (10 mg total) by mouth daily  before breakfast.  Dispense: 90 tablet; Refill: 1 -     AMB Referral VBCI Care Management  Pure hyperglyceridemia -     Lipid panel; Future  Microalbuminuria due to type 2 diabetes mellitus (HCC) -     Microalbumin / creatinine urine ratio; Future -     Urinalysis, Routine w reflex microscopic; Future -     Dapagliflozin Propanediol; Take 1 tablet (10 mg total) by mouth daily before breakfast.  Dispense: 90 tablet; Refill: 1  Encounter for general adult medical examination with abnormal findings- Exam completed, labs reviewed, vaccines reviewed, cancer screenings addressed, pt ed material was given.  -     PSA; Future  Screening for colon cancer -     Ambulatory referral to Gastroenterology  Coronary artery disease involving native coronary artery of native heart without angina pectoris- Risk factor modifications addressed. -     EKG 12-Lead -     Rosuvastatin Calcium; Take 1 tablet (40 mg total) by mouth daily.  Dispense: 90 tablet; Refill: 1 -     Aspirin; Take 1 tablet (81 mg total) by mouth daily. Swallow whole.  Dispense: 90 tablet; Refill: 1     Follow-up: Return in about 6 months (around 03/29/2024).  Sandra Crouch, MD

## 2023-10-02 MED ORDER — RYBELSUS 14 MG PO TABS: 14.0000 mg | ORAL_TABLET | Freq: Every day | ORAL | 1 refills | Status: DC

## 2023-10-02 MED ORDER — ICOSAPENT ETHYL 1 G PO CAPS: 2.0000 g | ORAL_CAPSULE | Freq: Two times a day (BID) | ORAL | 1 refills | Status: DC

## 2023-10-02 MED ORDER — ROSUVASTATIN CALCIUM 40 MG PO TABS: 40.0000 mg | ORAL_TABLET | Freq: Every day | ORAL | 1 refills | Status: DC

## 2023-10-02 MED ORDER — DAPAGLIFLOZIN PROPANEDIOL 10 MG PO TABS: 10.0000 mg | ORAL_TABLET | Freq: Every day | ORAL | 1 refills | Status: DC

## 2023-10-02 MED ORDER — ASPIRIN 81 MG PO TBEC: 81.0000 mg | DELAYED_RELEASE_TABLET | Freq: Every day | ORAL | 1 refills | Status: AC

## 2023-10-02 MED ORDER — METFORMIN HCL ER 750 MG PO TB24: 1500.0000 mg | ORAL_TABLET | Freq: Every day | ORAL | 1 refills | Status: DC

## 2023-10-06 ENCOUNTER — Telehealth: Payer: Self-pay | Admitting: *Deleted

## 2023-10-06 NOTE — Progress Notes (Signed)
 Care Guide Pharmacy Note  10/06/2023 Name: [name] MRN: [id number] DOB: [birth year]  Referred By: Arcadio Knuckles, MD Reason for referral: Complex Care Management and Call Attempt #1 (Outreach to schedule referral with pharmacist )   [name] is a 63 y.o. year old male who is a primary care patient of Arcadio Knuckles, MD.  [name] was referred to the pharmacist for assistance related to: DMII  An unsuccessful telephone outreach was attempted today to contact the patient who was referred to the pharmacy team for assistance with medication management. Additional attempts will be made to contact the patient.  [name], CMA [place]  [facility], [facility] Guide Direct Dial: [phone]  Fax: [phone] Website: [email or web address]

## 2023-10-06 NOTE — Progress Notes (Signed)
 Care Guide Pharmacy Note  10/06/2023 Name: [name] MRN: [id number] DOB: [birth year]  Referred By: Arcadio Knuckles, MD Reason for referral: Complex Care Management and Call Attempt #1 (Outreach to schedule referral with pharmacist )   [name] is a 63 y.o. year old male who is a primary care patient of Arcadio Knuckles, MD.  [name] was referred to the pharmacist for assistance related to: DMII  Successful contact was made with the patient to discuss pharmacy services including being ready for the pharmacist to call at least 5 minutes before the scheduled appointment time and to have medication bottles and any blood pressure readings ready for review. The patient agreed to meet with the pharmacist via telephone visit on 10/24/2023  Kandis Ormond, CMA [place]  [facility], [facility] Guide Direct Dial: [phone]  Fax: [phone] Website: [email or web address]

## 2023-10-24 ENCOUNTER — Other Ambulatory Visit

## 2023-10-24 ENCOUNTER — Telehealth: Payer: Self-pay | Admitting: Pharmacist

## 2023-10-24 NOTE — Telephone Encounter (Signed)
 Called patient for scheduled pharmacist telephone appointment. No answer, LVM with direct call back number.  Rainelle Bur, PharmD, BCPS, CPP Clinical Pharmacist Practitioner [facility] [phone]

## 2023-11-01 ENCOUNTER — Telehealth: Payer: Self-pay | Admitting: *Deleted

## 2023-11-01 NOTE — Progress Notes (Unsigned)
 Complex Care Management Care Guide Note  11/01/2023 Name: [name] MRN: [id number] DOB: 07-13-1960  [name] is a 63 y.o. year old male who is a primary care patient of Arcadio Knuckles, MD and is actively engaged with the care management team. I reached out to Renae Carney by phone today to assist with re-scheduling  with the Pharmacist.  Follow up plan: Unsuccessful telephone outreach attempt made. A HIPAA compliant phone message was left for the patient providing contact information and requesting a return call.  Kandis Ormond, CMA [place]  [facility], [facility] Guide Direct Dial: [phone]  Fax: [phone] Website: [email or web address]

## 2023-11-02 NOTE — Progress Notes (Signed)
 Complex Care Management Care Guide Note  11/02/2023 Name: [name] MRN: [id number] DOB: 1961/06/02  [name] is a 63 y.o. year old male who is a primary care patient of Arcadio Knuckles, MD and is actively engaged with the care management team. I reached out to Renae Carney by phone today to assist with re-scheduling  with the Pharmacist.  Follow up plan: pt declined further outreaches at this time   Kandis Ormond, CMA [facility]  [facility], [facility] Guide Direct Dial: [phone]  Fax: [phone] Website: [email or web address]

## 2023-12-01 ENCOUNTER — Encounter: Payer: Self-pay | Admitting: Internal Medicine

## 2023-12-03 DIAGNOSIS — E119 Type 2 diabetes mellitus without complications: Secondary | ICD-10-CM | POA: Diagnosis not present

## 2023-12-26 NOTE — Progress Notes (Unsigned)
 12/27/2023 [name] [id number] [birth year]  Referring provider: Joshua Debby CROME, MD Primary GI doctor: Dr. Federico  ASSESSMENT AND PLAN:  Colon cancer screening  No family history of colon cancer Will plan on colonoscopy at [facility] with Dr. Suzann due to scheduling restraints, as he is on call every 2 weeks ,will get permission to hold plavix  from Dr. Wonda  We have discussed the risks of bleeding, infection, perforation, medication reactions, and remote risk of death associated with colonoscopy. All questions were answered and the patient acknowledges these risk and wishes to proceed.  GERD new x several months, improved with OTC PPI Status post cholecystectomy No tobacco use, no NSAIDS, no ETOH No melena, no dysphagia, no weight gain Information given Consider EGD if any worsening symptoms  CAD status post PCI On Plavix  follows with Dr. Wonda Hold Plavix  for 5 days before procedure will instruct when and how to resume after procedure.  Patient understands that there is a low but real risk of cardiovascular event such as heart attack, stroke, or embolism /  thrombosis, or ischemia while off Plavix .  The patient consents to proceed.  Will communicate by phone or EMR with patient's prescribing provider to confirm that holding Plavix  is reasonable in this case.   OSA and asthma Not on CPAP  Type 2 diabetes A1C 9.0 On Rybelsus   Fatty liver seen on CT 2018    Latest Ref Rng & Units 09/28/2023    8:43 AM 09/15/2022   11:30 AM 09/07/2021   11:51 AM  Hepatic Function  Total Protein 6.0 - 8.3 g/dL 7.2  7.4  7.5   Albumin 3.5 - 5.2 g/dL 4.9  4.9  5.1   AST 0 - 37 U/L 18  16  15    ALT 0 - 53 U/L 35  22  20   Alk Phosphatase 39 - 117 U/L 49  50  42   Total Bilirubin 0.2 - 1.2 mg/dL 0.7  0.7  0.6   Bilirubin, Direct 0.0 - 0.3 mg/dL 0.1  0.1  0.1    Platelets 218.0  FIB 4 0.84 - need LFTs and CBC monitored every 6 months, - evaluation with imaging every 2-3 years, consider  elastography, declines today -Continue to work on risk factor modification including diet exercise and control of risk factors including blood sugars. - cessation of alcohol    Patient Care Team: Joshua Debby CROME, MD as PCP - General (Internal Medicine) Wonda Sharper, MD as PCP - Cardiology (Cardiology)  HISTORY OF PRESENT ILLNESS: 63 y.o. male with a past medical history listed below presents for evaluation of colonoscopy.   Patient was last seen in the clinic 04/2022 by Dr. Federico for colon cancer screening discussion but this was never done.   Discussed the use of AI scribe software for clinical note transcription with the patient, who gave verbal consent to proceed.  History of Present Illness   [name] is a 63 year old male with heart disease who presents with new onset heartburn.  He has been experiencing heartburn for the past two to three months, describing it as a new symptom. The heartburn is triggered by tomato-based products and greasy foods. He manages the symptoms with over-the-counter omeprazole, taking approximately two pills per week, which he finds helpful. No nausea, vomiting, trouble swallowing, or dark stools.  He has a history of heart disease and is under the care of a cardiologist. He takes a baby aspirin  daily. He is diabetic and  takes Rybelsus  and Farxiga  for his condition, with no recent changes in medication dosage. No significant weight changes recently.  He has a history of smoking for about fourteen years but quit a long time ago. He does not consume alcohol and denies the use of NSAIDs like Aleve or ibuprofen. He previously used a CPAP machine but discontinued its use several years ago, citing discomfort. He reports improved sleep and breathing since losing weight and quitting smoking.        He  reports that he quit smoking about 12 years ago. His smoking use included cigarettes. He started smoking about 42 years ago. He has a 45 pack-year  smoking history. He has never used smokeless tobacco. He reports that he does not drink alcohol and does not use drugs.  RELEVANT GI HISTORY, IMAGING AND LABS: Results   LABS HbA1c: 3.84%      CBC    Component Value Date/Time   WBC 8.0 09/28/2023 0843   RBC 5.59 09/28/2023 0843   HGB 14.9 09/28/2023 0843   HCT 44.8 09/28/2023 0843   PLT 218.0 09/28/2023 0843   MCV 80.1 09/28/2023 0843   MCH 26.8 08/07/2016 2018   MCHC 33.2 09/28/2023 0843   RDW 14.7 09/28/2023 0843   LYMPHSABS 2.0 09/28/2023 0843   MONOABS 0.5 09/28/2023 0843   EOSABS 0.2 09/28/2023 0843   BASOSABS 0.0 09/28/2023 0843   Recent Labs    09/28/23 0843  HGB 14.9    CMP     Component Value Date/Time   NA 141 09/28/2023 0843   NA 138 08/20/2012 0000   K 4.3 09/28/2023 0843   K 4.4 08/20/2012 0000   CL 103 09/28/2023 0843   CL 103 08/20/2012 0000   CO2 29 09/28/2023 0843   GLUCOSE 211 (H) 09/28/2023 0843   BUN 13 09/28/2023 0843   BUN 15 08/20/2012 0000   CREATININE 0.80 09/28/2023 0843   CREATININE 0.8 08/20/2012 0000   CALCIUM  9.9 09/28/2023 0843   CALCIUM  10.1 08/20/2012 0000   PROT 7.2 09/28/2023 0843   PROT 7.4 05/03/2019 0746   PROT 8.1 08/20/2012 0000   ALBUMIN 4.9 09/28/2023 0843   ALBUMIN 4.7 05/03/2019 0746   AST 18 09/28/2023 0843   AST 20 08/20/2012 0000   ALT 35 09/28/2023 0843   ALKPHOS 49 09/28/2023 0843   ALKPHOS 60 08/20/2012 0000   BILITOT 0.7 09/28/2023 0843   BILITOT 0.5 05/03/2019 0746   GFRNONAA >60 08/07/2016 2018   GFRAA >60 08/07/2016 2018      Latest Ref Rng & Units 09/28/2023    8:43 AM 09/15/2022   11:30 AM 09/07/2021   11:51 AM  Hepatic Function  Total Protein 6.0 - 8.3 g/dL 7.2  7.4  7.5   Albumin 3.5 - 5.2 g/dL 4.9  4.9  5.1   AST 0 - 37 U/L 18  16  15    ALT 0 - 53 U/L 35  22  20   Alk Phosphatase 39 - 117 U/L 49  50  42   Total Bilirubin 0.2 - 1.2 mg/dL 0.7  0.7  0.6   Bilirubin, Direct 0.0 - 0.3 mg/dL 0.1  0.1  0.1       Current Medications:    Current Outpatient Medications (Endocrine & Metabolic):    dapagliflozin  propanediol (FARXIGA ) 10 MG TABS tablet, Take 1 tablet (10 mg total) by mouth daily before breakfast.   metFORMIN  (GLUCOPHAGE -XR) 750 MG 24 hr tablet, Take 2 tablets (1,500 mg total) by  mouth daily with breakfast.   Semaglutide  (RYBELSUS ) 14 MG TABS, Take 1 tablet (14 mg total) by mouth daily.  Current Outpatient Medications (Cardiovascular):    icosapent  Ethyl (VASCEPA ) 1 g capsule, Take 2 capsules (2 g total) by mouth 2 (two) times daily.   metoprolol  tartrate (LOPRESSOR ) 25 MG tablet, Take 1 tablet (25 mg total) by mouth 2 (two) times daily.   nitroGLYCERIN  (NITROSTAT ) 0.4 MG SL tablet, Place 1 tablet (0.4 mg total) under the tongue every 5 (five) minutes as needed for chest pain.   olmesartan  (BENICAR ) 20 MG tablet, Take 1 tablet (20 mg total) by mouth daily.   rosuvastatin  (CRESTOR ) 40 MG tablet, Take 1 tablet (40 mg total) by mouth daily.   Current Outpatient Medications (Analgesics):    aspirin  EC 81 MG tablet, Take 1 tablet (81 mg total) by mouth daily. Swallow whole.  Current Outpatient Medications (Hematological):    clopidogrel  (PLAVIX ) 75 MG tablet, Take 1 tablet (75 mg total) by mouth daily.  Current Outpatient Medications (Other):    Cholecalciferol  (VITAMIN D3) 50 MCG (2000 UT) TABS, Take 1 tablet by mouth daily.   Na Sulfate-K Sulfate-Mg Sulfate concentrate (SUPREP) 17.5-3.13-1.6 GM/177ML SOLN, Take 1 kit (354 mLs total) by mouth once for 1 dose.  Medical History:  Past Medical History:  Diagnosis Date   Asthma    Coronary artery disease    s/p BMS to RCA and CFX. s/p DES to RCA 2/2 ISR 2010   Diabetes mellitus without complication (HCC)    Elevated cholesterol    History of kidney stones    20 yrs ago    Hyperlipidemia    Hypertension    Old myocardial infarction    Paroxysmal ventricular tachycardia (HCC)    Pneumonia    Sleep apnea    not using cpap    5-6 yrs    Allergies:   Allergies  Allergen Reactions   Heparin     Low bp     Surgical History:  He  has a past surgical history that includes Coronary stent placement (2002, 2007, 2010); Cardiac catheterization; Cholecystectomy (N/A, 07/28/2016); and Umbilical hernia repair (N/A, 07/28/2016). Family History:  His family history includes Alzheimer's disease in his mother; Diabetes in his mother and sister; Heart disease in his brother, mother, and sister; Kidney disease in his sister; Peripheral vascular disease in his sister.  REVIEW OF SYSTEMS  : All other systems reviewed and negative except where noted in the History of Present Illness.  PHYSICAL EXAM: BP 100/64   Pulse 70   Ht 5' 6 (1.676 m)   Wt 184 lb (83.5 kg)   BMI 29.70 kg/m  Physical Exam   GENERAL APPEARANCE: Well nourished, in no apparent distress. HEENT: No cervical lymphadenopathy, unremarkable thyroid , sclerae anicteric, conjunctiva pink. RESPIRATORY: Respiratory effort normal, breath sounds equal bilaterally without rales, rhonchi, or wheezing. Lungs clear to auscultation bilaterally. CARDIO: Regular rate and rhythm with no murmurs, rubs, or gallops, peripheral pulses intact. ABDOMEN: Soft, non-distended, active bowel sounds in all four quadrants, non-tender to palpation, no rebound tenderness, no mass appreciated. RECTAL: Declines examination. MUSCULOSKELETAL: Full range of motion, normal gait, without edema. SKIN: Dry, intact without rashes or lesions. No jaundice. NEURO: Alert, oriented, no focal deficits. PSYCH: Cooperative, normal mood and affect.      Alan JONELLE Coombs, PA-C 11:02 AM

## 2023-12-27 ENCOUNTER — Ambulatory Visit: Admitting: Physician Assistant

## 2023-12-27 ENCOUNTER — Telehealth: Payer: Self-pay | Admitting: *Deleted

## 2023-12-27 ENCOUNTER — Encounter: Payer: Self-pay | Admitting: Physician Assistant

## 2023-12-27 VITALS — BP 100/64 | HR 70 | Ht 66.0 in | Wt 184.0 lb

## 2023-12-27 DIAGNOSIS — K760 Fatty (change of) liver, not elsewhere classified: Secondary | ICD-10-CM | POA: Diagnosis not present

## 2023-12-27 DIAGNOSIS — K219 Gastro-esophageal reflux disease without esophagitis: Secondary | ICD-10-CM | POA: Diagnosis not present

## 2023-12-27 DIAGNOSIS — Z1211 Encounter for screening for malignant neoplasm of colon: Secondary | ICD-10-CM

## 2023-12-27 MED ORDER — NA SULFATE-K SULFATE-MG SULF 17.5-3.13-1.6 GM/177ML PO SOLN
1.0000 | Freq: Once | ORAL | 0 refills | Status: AC
Start: 2023-12-27 — End: 2023-12-27

## 2023-12-27 NOTE — Telephone Encounter (Signed)
 [facility] Pre-operative Risk Assessment     Request for surgical clearance:     Endoscopy Procedure  What type of surgery is being performed?     Colonoscopy  When is this surgery scheduled?     02/01/2024  What type of clearance is required ?   Pharmacy  Are there any medications that need to be held prior to surgery and how long? Plavix  5 days  Practice name and name of physician performing surgery?      [facility]  What is your office phone and fax number?      Phone- [phone]  Fax- [phone]  Anesthesia type (None, local, MAC, general) ?       MAC   Please route your response to Grayce Loge CMA

## 2023-12-27 NOTE — Telephone Encounter (Signed)
   Name: [name]  DOB: 1961/06/11  MRN: [id number]  Primary Cardiologist: Ozell Fell, MD   Preoperative team, please contact this patient and set up a phone call appointment for further preoperative risk assessment. Please obtain consent and complete medication review. Thank you for your help.  I confirm that guidance regarding antiplatelet and oral anticoagulation therapy has been completed and, if necessary, noted below.  Per office protocol, if patient is without any new symptoms or concerns at the time of their virtual visit, he may hold Plavix  for 5 days prior to procedure. Please resume Plavix  as soon as possible postprocedure, at the discretion of the surgeon. Regarding ASA therapy, we recommend continuation of ASA throughout the perioperative period.   I also confirmed the patient resides in the state of [place] . As per [facility] Medical Board telemedicine laws, the patient must reside in the state in which the provider is licensed.   Damien JAYSON Braver, NP 12/27/2023, 11:23 AM [facility]

## 2023-12-27 NOTE — Progress Notes (Signed)
 I agree with the assessment and plan as outlined by Ms. Steffanie Dunn.

## 2023-12-27 NOTE — Patient Instructions (Addendum)
 Can take famotidine at night every night OR take the over the counter prilosec as needed Avoid spicy and acidic foods Avoid fatty foods Limit your intake of coffee, tea, alcohol, and carbonated drinks Work to maintain a healthy weight Keep the head of the bed elevated at least 3 inches with blocks or a wedge pillow if you are having any nighttime symptoms Stay upright for 2 hours after eating Avoid meals and snacks three to four hours before bedtime  If any worsening symptoms will consider EGD  Metabolic dysfunction associated seatohepatitis  Now the leading cause of liver failure in the [place] .  It is normally from such risk factors as obesity, diabetes, insulin  resistance, high cholesterol, or metabolic syndrome.  The only definitive therapy is weight loss and exercise.   Suggest walking 20-30 mins daily.  Decreasing carbohydrates, increasing veggies.    Fatty Liver Fatty liver is the accumulation of fat in liver cells. It is also called hepatosteatosis or steatohepatitis. It is normal for your liver to contain some fat. If fat is more than 5 to 10% of your liver's weight, you have fatty liver.  There are often no symptoms (problems) for years while damage is still occurring. People often learn about their fatty liver when they have medical tests for other reasons. Fat can damage your liver for years or even decades without causing problems. When it becomes severe, it can cause fatigue, weight loss, weakness, and confusion. This makes you more likely to develop more serious liver problems. The liver is the largest organ in the body. It does a lot of work and often gives no warning signs when it is sick until late in a disease. The liver has many important jobs including: Breaking down foods. Storing vitamins, iron, and other minerals. Making proteins. Making bile for food digestion. Breaking down many products including medications, alcohol and some poisons.  PROGNOSIS  Fatty  liver may cause no damage or it can lead to an inflammation of the liver. This is, called steatohepatitis.  Over time the liver may become scarred and hardened. This condition is called cirrhosis. Cirrhosis is serious and may lead to liver failure or cancer. NASH is one of the leading causes of cirrhosis. About 10-20% of Americans have fatty liver and a smaller 2-5% has NASH.  TREATMENT  Weight loss, fat restriction, and exercise in overweight patients produces inconsistent results but is worth trying. Good control of diabetes may reduce fatty liver. Eat a balanced, healthy diet. Increase your physical activity. There are no medical or surgical treatments for a fatty liver or NASH, but improving your diet and increasing your exercise may help prevent or reverse some of the damage.   Due to recent changes in healthcare laws, you may see the results of your imaging and laboratory studies on MyChart before your provider has had a chance to review them.  We understand that in some cases there may be results that are confusing or concerning to you. Not all laboratory results come back in the same time frame and the provider may be waiting for multiple results in order to interpret others.  Please give us  48 hours in order for your provider to thoroughly review all the results before contacting the office for clarification of your results.

## 2023-12-27 NOTE — Telephone Encounter (Signed)
 I s/w the pt and we discuss options of keep 01/2024 appt with Glendia Ferrier, [facility] and schedule a tele appt for preop clearance; or move appt sooner with Glendia Ferrier, PAC so that there are not 2 appts that are fairly close in time being billed to insurance.   Pt has moved appt with Glendia Ferrier, PAC to 01/18/24 8:50 am. Pt thanked me for my time and going over options. Pt agreed he will d/w [facility] 01/18/24 if the 01/2024 appt will still be needed, if not then [facility] will cancel that appt.   I will update all parties involved.

## 2024-01-17 NOTE — Progress Notes (Unsigned)
 OFFICE NOTE:    Date:  01/18/2024  ID:  [name], DOB 1961-03-10, MRN [id number] PCP: Joshua Debby CROME, MD  [facility] Providers Cardiologist:  Ozell Fell, MD        Coronary artery disease  Hx of ACS; s/p multiple PCI/stents [BMS to RCA, BMS to LCx, DES to RCA (inside BMS)] Long term DAPT S/p 2.75 x 33 mm Cypher DES to the RCA in 04/2009 LHC 04/25/2009: LM patent, distal LAD 40-50, D3 30; proximal LCx 30, distal LCx stent patent; RCA stent 50, 90 ISR, distal RCA stent patent, PDA 30, EF >55 Hypertension  Diabetes mellitus  Hyperlipidemia  OSA        Discussed the use of AI scribe software for clinical note transcription with the patient, who gave verbal consent to proceed. History of Present Illness [name] is a 63 y.o. male who returns for surgical clearance. He was last seen in 01/2023. He needs an EGD 02/01/24. Clopidogrel  needs to be held for 5 days.   He has not experienced any new chest symptoms since his last visit. No chest pressure, tightness, jaw or arm discomfort, or shortness of breath. He remains active, working at an apartment complex, and does not experience discomfort during regular activities such as walking up hills or stairs. He quit smoking 13 years ago after smoking for many years starting in his teens.  He has not experienced any swelling in his legs or difficulty breathing when lying flat.      Review of Systems  Constitutional: Negative for fever.  Respiratory:  Negative for cough.   -See HPI    Studies Reviewed:  EKG Interpretation Date/Time:  Wednesday January 18 2024 08:21:36 EDT Ventricular Rate:  71 PR Interval:  160 QRS Duration:  90 QT Interval:  392 QTC Calculation: 425 R Axis:   50  Text Interpretation: Normal sinus rhythm Nonspecific ST and T wave abnormality No significant change since last tracing Confirmed by Lelon Hamilton [phone]) on 01/18/2024 8:31:33 AM   Labs-09/28/2023 Total cholesterol 102, HDL 33.3, LDL 15  triglycerides 269, hemoglobin 14.9 creatinine 0.8 potassium 4.3, ALT 35         Physical Exam:  VS:  BP 120/68   Pulse 71   Ht 5' 6 (1.676 m)   Wt 183 lb (83 kg)   SpO2 94%   BMI 29.54 kg/m        Wt Readings from Last 3 Encounters:  01/18/24 183 lb (83 kg)  12/27/23 184 lb (83.5 kg)  09/28/23 187 lb (84.8 kg)    Constitutional:      Appearance: Healthy appearance. Not in distress.  Neck:     Vascular: JVD normal.  Pulmonary:     Breath sounds: No wheezing. Rales (L basilar) present.  Cardiovascular:     Normal rate. Regular rhythm.     Murmurs: There is no murmur.  Edema:    Peripheral edema absent.  Abdominal:     Palpations: Abdomen is soft.       Assessment and Plan:    Assessment & Plan Preoperative cardiovascular examination Mr. Massenburg's perioperative risk of a major cardiac event is 0.9% according to the Revised Cardiac Risk Index (RCRI).  Therefore, he is at low risk for perioperative complications.   His functional capacity is good at 4.31 METs according to the Duke Activity Status Index (DASI). Recommendations: According to ACC/AHA guidelines, no further cardiovascular testing needed.  The patient may proceed to surgery at acceptable risk.  Antiplatelet and/or Anticoagulation Recommendations: The patient should remain on Aspirin  without interruption.   Clopidogrel  (Plavix ) can be held for 5 days prior to his surgery and resumed as soon as possible post op. Coronary artery disease involving native coronary artery of native heart without angina pectoris History of multiple PCI in the past.  Most recent PCI was with a DES to the RCA in November 2010.  He has done well.  He remains very active with chest discomfort only with very extreme exertion.  Otherwise he remains asymptomatic. -Continue aspirin  81 mg daily, Plavix  End 5 mg daily, metoprolol  tartrate 25 mg twice daily, nitroglycerin  as needed, olmesartan  20 mg daily, Crestor  40 mg daily -Follow-up 1  year Pure hyperglyceridemia LDL optimal.  Triglycerides remain elevated.  We discussed managing diet to help reduce triglycerides further.  His A1c is 9.  We also discussed better management of his diabetes to help improve triglycerides. -Continue Vascepa  2 g twice daily, rosuvastatin  40 mg daily Essential hypertension Blood pressure controlled -Continue metoprolol  tartrate 25 mg twice daily, olmesartan  20 mg daily Former smoker Lung field abnormal finding on examination He has rales in the left base that did not clear with cough.  He has not had any recent shortness of breath, cough or fever.  He is a former smoker.  It has been several years since his last chest x-ray.  Will arrange 2 view chest x-ray.      Dispo:  Return in about 1 year (around 01/17/2025) for Routine Follow Up, w/ Dr. Wonda, or Glendia Ferrier, PA-C.  Signed, Glendia Ferrier, PA-C

## 2024-01-18 ENCOUNTER — Ambulatory Visit (HOSPITAL_COMMUNITY)
Admission: RE | Admit: 2024-01-18 | Discharge: 2024-01-18 | Disposition: A | Discharge status: Home / Self Care | Source: Ambulatory Visit | Attending: Cardiovascular Disease | Admitting: Cardiovascular Disease

## 2024-01-18 ENCOUNTER — Encounter: Payer: Self-pay | Admitting: Physician Assistant

## 2024-01-18 ENCOUNTER — Other Ambulatory Visit (HOSPITAL_COMMUNITY)

## 2024-01-18 ENCOUNTER — Ambulatory Visit: Attending: Cardiology | Admitting: Physician Assistant

## 2024-01-18 VITALS — BP 120/68 | HR 71 | Ht 66.0 in | Wt 183.0 lb

## 2024-01-18 DIAGNOSIS — Z87891 Personal history of nicotine dependence: Secondary | ICD-10-CM | POA: Insufficient documentation

## 2024-01-18 DIAGNOSIS — I10 Essential (primary) hypertension: Secondary | ICD-10-CM | POA: Diagnosis not present

## 2024-01-18 DIAGNOSIS — Z01810 Encounter for preprocedural cardiovascular examination: Secondary | ICD-10-CM

## 2024-01-18 DIAGNOSIS — I2510 Atherosclerotic heart disease of native coronary artery without angina pectoris: Secondary | ICD-10-CM

## 2024-01-18 DIAGNOSIS — E781 Pure hyperglyceridemia: Secondary | ICD-10-CM

## 2024-01-18 DIAGNOSIS — R918 Other nonspecific abnormal finding of lung field: Secondary | ICD-10-CM

## 2024-01-18 NOTE — Assessment & Plan Note (Signed)
 Blood pressure controlled -Continue metoprolol  tartrate 25 mg twice daily, olmesartan  20 mg daily

## 2024-01-18 NOTE — Assessment & Plan Note (Signed)
 History of multiple PCI in the past.  Most recent PCI was with a DES to the RCA in November 2010.  He has done well.  He remains very active with chest discomfort only with very extreme exertion.  Otherwise he remains asymptomatic. -Continue aspirin  81 mg daily, Plavix  End 5 mg daily, metoprolol  tartrate 25 mg twice daily, nitroglycerin  as needed, olmesartan  20 mg daily, Crestor  40 mg daily -Follow-up 1 year

## 2024-01-18 NOTE — Assessment & Plan Note (Signed)
 LDL optimal.  Triglycerides remain elevated.  We discussed managing diet to help reduce triglycerides further.  His A1c is 9.  We also discussed better management of his diabetes to help improve triglycerides. -Continue Vascepa  2 g twice daily, rosuvastatin  40 mg daily

## 2024-01-18 NOTE — Patient Instructions (Signed)
 Medication Instructions:  Your physician recommends that you continue on your current medications as directed. Please refer to the Current Medication list given to you today.  *If you need a refill on your cardiac medications before your next appointment, please call your pharmacy*  Lab Work: None ordered  If you have labs (blood work) drawn today and your tests are completely normal, you will receive your results only by: MyChart Message (if you have MyChart) OR A paper copy in the mail If you have any lab test that is abnormal or we need to change your treatment, we will call you to review the results.  Testing/Procedures: A chest x-ray takes a picture of the organs and structures inside the chest, including the heart, lungs, and blood vessels. This test can show several things, including, whether the heart is enlarges; whether fluid is building up in the lungs; and whether pacemaker / defibrillator leads are still in place.   Follow-Up: At [facility], you and your health needs are our priority.  As part of our continuing mission to provide you with exceptional heart care, our providers are all part of one team.  This team includes your primary Cardiologist (physician) and Advanced Practice Providers or APPs (Physician Assistants and Nurse Practitioners) who all work together to provide you with the care you need, when you need it.  Your next appointment:   1 year(s)  Provider:   Ozell Fell, MD or Glendia Ferrier, PA-C          We recommend signing up for the patient portal called MyChart.  Sign up information is provided on this After Visit Summary.  MyChart is used to connect with patients for Virtual Visits (Telemedicine).  Patients are able to view lab/test results, encounter notes, upcoming appointments, etc.  Non-urgent messages can be sent to your provider as well.   To learn more about what you can do with MyChart, go to [email or web address].   Other  Instructions

## 2024-01-19 NOTE — Telephone Encounter (Signed)
 Spoke to patient and he is aware.

## 2024-01-19 NOTE — Telephone Encounter (Signed)
 See [facility] note [facility] from 01/18/2024, Dr Ozell Fell ok's patient to HOLD 5 days  Will call patient to verify he is aware

## 2024-01-25 ENCOUNTER — Ambulatory Visit: Payer: Self-pay | Admitting: Physician Assistant

## 2024-02-01 ENCOUNTER — Encounter: Payer: Self-pay | Admitting: Pediatrics

## 2024-02-01 ENCOUNTER — Encounter: Admitting: Pediatrics

## 2024-02-07 NOTE — Progress Notes (Unsigned)
 [facility] History and Physical   Primary Care Physician:  Joshua Debby CROME, MD   Reason for Procedure:  Colorectal cancer screening  Plan:    Screening colonoscopy     HPI: [name] is a 63 y.o. male undergoing screening colonoscopy for colorectal cancer screening.  This is the patient's first colonoscopy.  No family history of colorectal cancer or polyps.  Patient denies current symptoms of change in bowel habits or rectal bleeding.  Patient takes Plavix -last dose***   Past Medical History:  Diagnosis Date   Asthma    Coronary artery disease    s/p BMS to RCA and CFX. s/p DES to RCA 2/2 ISR 2010   Diabetes mellitus without complication (HCC)    Elevated cholesterol    History of kidney stones    20 yrs ago    Hyperlipidemia    Hypertension    Old myocardial infarction    Paroxysmal ventricular tachycardia (HCC)    Pneumonia    Sleep apnea    not using cpap    5-6 yrs     Past Surgical History:  Procedure Laterality Date   CARDIAC CATHETERIZATION     CHOLECYSTECTOMY N/A 07/28/2016   Procedure: LAPAROSCOPIC CHOLECYSTECTOMY;  Surgeon: Vicenta Poli, MD;  Location: [facility] OR;  Service: General;  Laterality: N/A;   CORONARY STENT PLACEMENT  2002, 2007, 2010   right   UMBILICAL HERNIA REPAIR N/A 07/28/2016   Procedure: HERNIA REPAIR UMBILICAL ADULT;  Surgeon: Vicenta Poli, MD;  Location: [facility] OR;  Service: General;  Laterality: N/A;    Prior to Admission medications   Medication Sig Start Date End Date Taking? Authorizing Provider  aspirin  EC 81 MG tablet Take 1 tablet (81 mg total) by mouth daily. Swallow whole. 10/02/23   Joshua Debby CROME, MD  Cholecalciferol  (VITAMIN D3) 50 MCG (2000 UT) TABS Take 1 tablet by mouth daily.    [provider]  clopidogrel  (PLAVIX ) 75 MG tablet Take 1 tablet (75 mg total) by mouth daily. 02/09/23   Lelon Hamilton T, PA-C  dapagliflozin  propanediol (FARXIGA ) 10 MG TABS tablet Take 1 tablet (10 mg total) by mouth  daily before breakfast. 10/02/23   Joshua Debby CROME, MD  icosapent  Ethyl (VASCEPA ) 1 g capsule Take 2 capsules (2 g total) by mouth 2 (two) times daily. 10/02/23   Joshua Debby CROME, MD  metFORMIN  (GLUCOPHAGE -XR) 750 MG 24 hr tablet Take 2 tablets (1,500 mg total) by mouth daily with breakfast. 10/02/23   Joshua Debby CROME, MD  metoprolol  tartrate (LOPRESSOR ) 25 MG tablet Take 1 tablet (25 mg total) by mouth 2 (two) times daily. 02/09/23   Lelon Hamilton T, PA-C  nitroGLYCERIN  (NITROSTAT ) 0.4 MG SL tablet Place 1 tablet (0.4 mg total) under the tongue every 5 (five) minutes as needed for chest pain. 06/29/23   Plotnikov, Karlynn GAILS, MD  olmesartan  (BENICAR ) 20 MG tablet Take 1 tablet (20 mg total) by mouth daily. 06/29/23   Plotnikov, Karlynn GAILS, MD  rosuvastatin  (CRESTOR ) 40 MG tablet Take 1 tablet (40 mg total) by mouth daily. 10/02/23   Joshua Debby CROME, MD  Semaglutide  (RYBELSUS ) 14 MG TABS Take 1 tablet (14 mg total) by mouth daily. 10/02/23   Joshua Debby CROME, MD    Current Outpatient Medications  Medication Sig Dispense Refill   aspirin  EC 81 MG tablet Take 1 tablet (81 mg total) by mouth daily. Swallow whole. 90 tablet 1   Cholecalciferol  (VITAMIN D3) 50 MCG (2000 UT) TABS Take 1 tablet by  mouth daily.     clopidogrel  (PLAVIX ) 75 MG tablet Take 1 tablet (75 mg total) by mouth daily. 90 tablet 3   dapagliflozin  propanediol (FARXIGA ) 10 MG TABS tablet Take 1 tablet (10 mg total) by mouth daily before breakfast. 90 tablet 1   icosapent  Ethyl (VASCEPA ) 1 g capsule Take 2 capsules (2 g total) by mouth 2 (two) times daily. 360 capsule 1   metFORMIN  (GLUCOPHAGE -XR) 750 MG 24 hr tablet Take 2 tablets (1,500 mg total) by mouth daily with breakfast. 180 tablet 1   metoprolol  tartrate (LOPRESSOR ) 25 MG tablet Take 1 tablet (25 mg total) by mouth 2 (two) times daily. 180 tablet 3   nitroGLYCERIN  (NITROSTAT ) 0.4 MG SL tablet Place 1 tablet (0.4 mg total) under the tongue every 5 (five) minutes as needed for chest pain. 25  tablet 11   olmesartan  (BENICAR ) 20 MG tablet Take 1 tablet (20 mg total) by mouth daily. 90 tablet 1   rosuvastatin  (CRESTOR ) 40 MG tablet Take 1 tablet (40 mg total) by mouth daily. 90 tablet 1   Semaglutide  (RYBELSUS ) 14 MG TABS Take 1 tablet (14 mg total) by mouth daily. 90 tablet 1   No current facility-administered medications for this visit.    Allergies as of 02/08/2024 - Review Complete 01/18/2024  Allergen Reaction Noted   Heparin  09/17/2010    Family History  Problem Relation Age of Onset   Heart disease Mother    Diabetes Mother    Alzheimer's disease Mother    Diabetes Sister    Heart disease Brother        bypass for both   Heart disease Sister        bypass   Peripheral vascular disease Sister    Kidney disease Sister    Cancer Neg Hx    Alcohol abuse Neg Hx    Early death Neg Hx    Hyperlipidemia Neg Hx    Hypertension Neg Hx    Stroke Neg Hx     Social History   Socioeconomic History   Marital status: Married    Spouse name: Not on file   Number of children: Not on file   Years of education: Not on file   Highest education level: Not on file  Occupational History   Occupation: painter    Employer: [employer]  Tobacco Use   Smoking status: Former    Current packs/day: 0.00    Average packs/day: 1.5 packs/day for 30.0 years (45.0 ttl pk-yrs)    Types: Cigarettes    Start date: 08/21/1981    Quit date: 08/22/2011    Years since quitting: 12.4   Smokeless tobacco: Never   Tobacco comments:    currently smoking 1 ppd.   Vaping Use   Vaping status: Never Used  Substance and Sexual Activity   Alcohol use: No   Drug use: No   Sexual activity: Yes    Birth control/protection: None  Other Topics Concern   Not on file  Social History Narrative   Not on file   Social Drivers of Health   Financial Resource Strain: Not on file  Food Insecurity: Not on file  Transportation Needs: Not on file  Physical Activity: Not on file  Stress: Not on  file  Social Connections: Not on file  Intimate Partner Violence: Not on file    Review of Systems:  All other review of systems negative except as mentioned in the HPI.  Physical Exam: Vital signs There were  no vitals taken for this visit.  General:   Alert,  Well-developed, well-nourished, pleasant and cooperative in NAD Airway:  Mallampati  Lungs:  Clear throughout to auscultation.   Heart:  Regular rate and rhythm; no murmurs, clicks, rubs,  or gallops. Abdomen:  Soft, nontender and nondistended. Normal bowel sounds.   Neuro/Psych:  Normal mood and affect. A and O x 3  Inocente Hausen, MD [facility]

## 2024-02-08 ENCOUNTER — Ambulatory Visit (AMBULATORY_SURGERY_CENTER): Admitting: Pediatrics

## 2024-02-08 ENCOUNTER — Encounter: Payer: Self-pay | Admitting: Pediatrics

## 2024-02-08 VITALS — BP 125/79 | HR 76 | Temp 97.3°F | Resp 16 | Ht 66.0 in | Wt 184.0 lb

## 2024-02-08 DIAGNOSIS — D123 Benign neoplasm of transverse colon: Secondary | ICD-10-CM | POA: Diagnosis not present

## 2024-02-08 DIAGNOSIS — D128 Benign neoplasm of rectum: Secondary | ICD-10-CM | POA: Diagnosis not present

## 2024-02-08 DIAGNOSIS — Z1211 Encounter for screening for malignant neoplasm of colon: Secondary | ICD-10-CM

## 2024-02-08 DIAGNOSIS — K635 Polyp of colon: Secondary | ICD-10-CM | POA: Diagnosis not present

## 2024-02-08 MED ORDER — SODIUM CHLORIDE 0.9 % IV SOLN
500.0000 mL | Freq: Once | INTRAVENOUS | Status: DC
Start: 2024-02-08 — End: 2024-02-08

## 2024-02-08 NOTE — Progress Notes (Signed)
 Called to room to assist during endoscopic procedure.  Patient ID and intended procedure confirmed with present staff. Received instructions for my participation in the procedure from the performing physician.

## 2024-02-08 NOTE — Progress Notes (Signed)
 Pt sedate, gd SR's, VSS, report to RN

## 2024-02-08 NOTE — Op Note (Signed)
 [facility] Patient Name: [name] Procedure Date: 02/08/2024 8:10 AM MRN: [id number] Endoscopist: Inocente Hausen , MD, [id number] Age: 63 Referring MD:  Date of Birth: February 02, 1961 Gender: Male Account #: [id number] Procedure:                Colonoscopy Indications:              Screening for colorectal malignant neoplasm, This                            is the patient's first colonoscopy Medicines:                Monitored Anesthesia Care Procedure:                Pre-Anesthesia Assessment:                           - Prior to the procedure, a History and Physical                            was performed, and patient medications and                            allergies were reviewed. The patient's tolerance of                            previous anesthesia was also reviewed. The risks                            and benefits of the procedure and the sedation                            options and risks were discussed with the patient.                            All questions were answered, and informed consent                            was obtained. Prior Anticoagulants: The patient has                            taken Plavix  (clopidogrel ), last dose was 7 days                            prior to procedure. ASA Grade Assessment: III - A                            patient with severe systemic disease. After                            reviewing the risks and benefits, the patient was                            deemed in satisfactory condition to undergo the  procedure.                           After obtaining informed consent, the colonoscope                            was passed under direct vision. Throughout the                            procedure, the patient's blood pressure, pulse, and                            oxygen saturations were monitored continuously. The                            Olympus Scope [id number] was introduced through  the                            anus and advanced to the cecum, identified by                            appendiceal orifice and ileocecal valve. The                            colonoscopy was performed without difficulty. The                            patient tolerated the procedure well. The quality                            of the bowel preparation was good. The ileocecal                            valve, appendiceal orifice, and rectum were                            photographed. Scope In: 8:22:49 AM Scope Out: 8:42:56 AM Scope Withdrawal Time: 0 hours 16 minutes 31 seconds  Total Procedure Duration: 0 hours 20 minutes 7 seconds  Findings:                 The perianal and digital rectal examinations were                            normal. Pertinent negatives include normal                            sphincter tone and no palpable rectal lesions.                           Three sessile polyps were found in the rectum (1)                            and transverse colon (2). The polyps were 3 to 4 mm  in size. These polyps were removed with a cold                            biopsy forceps. Resection and retrieval were                            complete.                           A 5 mm polyp was found in the transverse colon. The                            polyp was sessile. The polyp was removed with a                            cold snare. Resection and retrieval were complete.                           The retroflexed view of the distal rectum and anal                            verge was normal and showed no anal or rectal                            abnormalities. Complications:            No immediate complications. Estimated blood loss:                            Minimal. Estimated Blood Loss:     Estimated blood loss was minimal. Impression:               - Three 3 to 4 mm polyps in the rectum and in the                            transverse colon,  removed with a cold biopsy                            forceps. Resected and retrieved.                           - One 5 mm polyp in the transverse colon, removed                            with a cold snare. Resected and retrieved. Recommendation:           - Discharge patient to home (ambulatory).                           - Await pathology results.                           - Repeat colonoscopy for surveillance based on  pathology results.                           - Resume Plavix  (clopidogrel ) at prior dose                            tomorrow.                           - The findings and recommendations were discussed                            with the patient's family.                           - Patient has a contact number available for                            emergencies. The signs and symptoms of potential                            delayed complications were discussed with the                            patient. Return to normal activities tomorrow.                            Written discharge instructions were provided to the                            patient. Inocente Hausen, MD 02/08/2024 8:47:48 AM This report has been signed electronically.

## 2024-02-08 NOTE — Progress Notes (Signed)
 Pt's states no medical or surgical changes since previsit or office visit.

## 2024-02-08 NOTE — Patient Instructions (Signed)
 YOU HAD AN ENDOSCOPIC PROCEDURE TODAY AT THE [facility]:   Refer to the procedure report that was given to you for any specific questions about what was found during the examination.  If the procedure report does not answer your questions, please call your gastroenterologist to clarify.  If you requested that your care partner not be given the details of your procedure findings, then the procedure report has been included in a sealed envelope for you to review at your convenience later.  YOU SHOULD EXPECT: Some feelings of bloating in the abdomen. Passage of more gas than usual.  Walking can help get rid of the air that was put into your GI tract during the procedure and reduce the bloating. If you had a lower endoscopy (such as a colonoscopy or flexible sigmoidoscopy) you may notice spotting of blood in your stool or on the toilet paper. If you underwent a bowel prep for your procedure, you may not have a normal bowel movement for a few days.  Please Note:  You might notice some irritation and congestion in your nose or some drainage.  This is from the oxygen used during your procedure.  There is no need for concern and it should clear up in a day or so.  SYMPTOMS TO REPORT IMMEDIATELY:  Following lower endoscopy (colonoscopy or flexible sigmoidoscopy):  Excessive amounts of blood in the stool  Significant tenderness or worsening of abdominal pains  Swelling of the abdomen that is new, acute  Fever of 100F or higher  Await pathology results Resume previous diet Resume Plavix  at prior dose tomorrow  For urgent or emergent issues, a gastroenterologist can be reached at any hour by calling [phone]. Do not use MyChart messaging for urgent concerns.    DIET:  We do recommend a small meal at first, but then you may proceed to your regular diet.  Drink plenty of fluids but you should avoid alcoholic beverages for 24 hours.  ACTIVITY:  You should plan to take it easy for the  rest of today and you should NOT DRIVE or use heavy machinery until tomorrow (because of the sedation medicines used during the test).    FOLLOW UP: Our staff will call the number listed on your records the next business day following your procedure.  We will call around 7:15- 8:00 am to check on you and address any questions or concerns that you may have regarding the information given to you following your procedure. If we do not reach you, we will leave a message.     If any biopsies were taken you will be contacted by phone or by letter within the next 1-3 weeks.  Please call us  at [phone] if you have not heard about the biopsies in 3 weeks.    SIGNATURES/CONFIDENTIALITY: You and/or your care partner have signed paperwork which will be entered into your electronic medical record.  These signatures attest to the fact that that the information above on your After Visit Summary has been reviewed and is understood.  Full responsibility of the confidentiality of this discharge information lies with you and/or your care-partner.

## 2024-02-09 ENCOUNTER — Telehealth: Payer: Self-pay | Admitting: *Deleted

## 2024-02-09 NOTE — Telephone Encounter (Signed)
  Follow up Call-     02/08/2024    7:32 AM  Call back number  Post procedure Call Back phone  # [phone]  Permission to leave phone message Yes     Patient questions:  Do you have a fever, pain , or abdominal swelling? No. Pain Score  0 *  Have you tolerated food without any problems? Yes.    Have you been able to return to your normal activities? Yes.    Do you have any questions about your discharge instructions: Diet   No. Medications  No. Follow up visit  No.  Do you have questions or concerns about your Care? No.  Actions: * If pain score is 4 or above: No action needed, pain <4.

## 2024-02-10 LAB — SURGICAL PATHOLOGY

## 2024-02-12 ENCOUNTER — Ambulatory Visit: Payer: Self-pay | Admitting: Pediatrics

## 2024-02-13 ENCOUNTER — Ambulatory Visit: Admitting: Physician Assistant

## 2024-02-20 ENCOUNTER — Other Ambulatory Visit: Payer: Self-pay | Admitting: Internal Medicine

## 2024-02-20 ENCOUNTER — Other Ambulatory Visit: Payer: Self-pay | Admitting: Physician Assistant

## 2024-02-20 DIAGNOSIS — E785 Hyperlipidemia, unspecified: Secondary | ICD-10-CM

## 2024-02-20 DIAGNOSIS — I2510 Atherosclerotic heart disease of native coronary artery without angina pectoris: Secondary | ICD-10-CM

## 2024-02-21 ENCOUNTER — Other Ambulatory Visit: Payer: Self-pay

## 2024-02-22 MED ORDER — CLOPIDOGREL BISULFATE 75 MG PO TABS: 75.0000 mg | ORAL_TABLET | Freq: Every day | ORAL | 3 refills | Status: AC

## 2024-02-29 ENCOUNTER — Other Ambulatory Visit: Payer: Self-pay | Admitting: Internal Medicine

## 2024-02-29 DIAGNOSIS — I10 Essential (primary) hypertension: Secondary | ICD-10-CM

## 2024-02-29 DIAGNOSIS — E1129 Type 2 diabetes mellitus with other diabetic kidney complication: Secondary | ICD-10-CM

## 2024-02-29 DIAGNOSIS — I2510 Atherosclerotic heart disease of native coronary artery without angina pectoris: Secondary | ICD-10-CM

## 2024-02-29 DIAGNOSIS — E781 Pure hyperglyceridemia: Secondary | ICD-10-CM

## 2024-04-12 ENCOUNTER — Other Ambulatory Visit: Payer: Self-pay | Admitting: Internal Medicine

## 2024-04-12 DIAGNOSIS — I2510 Atherosclerotic heart disease of native coronary artery without angina pectoris: Secondary | ICD-10-CM

## 2024-04-12 DIAGNOSIS — E781 Pure hyperglyceridemia: Secondary | ICD-10-CM

## 2024-04-16 ENCOUNTER — Telehealth: Payer: Self-pay

## 2024-04-16 ENCOUNTER — Other Ambulatory Visit (HOSPITAL_COMMUNITY): Payer: Self-pay

## 2024-04-16 ENCOUNTER — Other Ambulatory Visit: Payer: Self-pay | Admitting: Internal Medicine

## 2024-04-16 DIAGNOSIS — E781 Pure hyperglyceridemia: Secondary | ICD-10-CM

## 2024-04-16 NOTE — Telephone Encounter (Signed)
 Pharmacy Patient Advocate Encounter  Insurance verification completed.   The patient is insured through [facility]   Ran test claim for Icosapent  Ethyl 1GM capsules Currently a quantity of 120 is a 30 day supply and the co-pay is 25.00. PLAN PREFERRED BRAND NAME (VASCEPA ) 1 g capsule No PA needed at this time.  This test claim was processed through [facility]- copay amounts may vary at other pharmacies due to pharmacy/plan contracts, or as the patient moves through the different stages of their insurance plan.

## 2024-04-19 ENCOUNTER — Telehealth: Payer: Self-pay

## 2024-04-19 ENCOUNTER — Other Ambulatory Visit (HOSPITAL_COMMUNITY): Payer: Self-pay

## 2024-04-19 NOTE — Telephone Encounter (Signed)
 Pharmacy Patient Advocate Encounter   Received notification from Onbase that prior authorization for Rybelsus  14MG  tablets  is required/requested.   Insurance verification completed.   The patient is insured through [facility].   Per test claim: PA required; PA submitted to above mentioned insurance via Latent Key/confirmation #/EOC [id number] Status is pending

## 2024-04-20 NOTE — Telephone Encounter (Signed)
 Patient has been made aware via VM.

## 2024-04-23 ENCOUNTER — Other Ambulatory Visit (HOSPITAL_COMMUNITY): Payer: Self-pay

## 2024-04-23 NOTE — Telephone Encounter (Signed)
 Pharmacy Patient Advocate Encounter  Received notification from [facility] that Prior Authorization for Rybelsus  14MG  tablets has been APPROVED from 04/19/24 to 04/19/25. Ran test claim, Copay is $44.99. This test claim was processed through [facility]- copay amounts may vary at other pharmacies due to pharmacy/plan contracts, or as the patient moves through the different stages of their insurance plan.   PA #/Case ID/Reference #: [id number]

## 2024-05-14 ENCOUNTER — Other Ambulatory Visit: Payer: Self-pay | Admitting: Internal Medicine

## 2024-05-14 DIAGNOSIS — I2510 Atherosclerotic heart disease of native coronary artery without angina pectoris: Secondary | ICD-10-CM

## 2024-05-14 DIAGNOSIS — E781 Pure hyperglyceridemia: Secondary | ICD-10-CM

## 2024-05-18 ENCOUNTER — Other Ambulatory Visit: Payer: Self-pay | Admitting: Internal Medicine

## 2024-05-18 DIAGNOSIS — E118 Type 2 diabetes mellitus with unspecified complications: Secondary | ICD-10-CM

## 2024-05-21 ENCOUNTER — Telehealth: Payer: Self-pay | Admitting: Internal Medicine

## 2024-05-21 DIAGNOSIS — E781 Pure hyperglyceridemia: Secondary | ICD-10-CM

## 2024-05-21 DIAGNOSIS — E118 Type 2 diabetes mellitus with unspecified complications: Secondary | ICD-10-CM

## 2024-05-21 NOTE — Telephone Encounter (Unsigned)
 Copied from CRM #[id number]. Topic: Clinical - Medication Refill >> May 21, 2024  8:47 AM Brittany M wrote: Medication: Semaglutide  (RYBELSUS ) 14 MG TABS ; omega-3 acid ethyl esters (LOVAZA ) 1 g capsule  Has the patient contacted their pharmacy? Yes (Agent: If no, request that the patient contact the pharmacy for the refill. If patient does not wish to contact the pharmacy document the reason why and proceed with request.) (Agent: If yes, when and what did the pharmacy advise?)  This is the patient's preferred pharmacy:  [id number] [place], [place] - [address] [place] [address] Phone: [phone] Fax: [phone]  Is this the correct pharmacy for this prescription? Yes If no, delete pharmacy and type the correct one.   Has the prescription been filled recently? Yes  Is the patient out of the medication? Yes  Has the patient been seen for an appointment in the last year OR does the patient have an upcoming appointment? Yes  Can we respond through MyChart? Yes  Agent: Please be advised that Rx refills may take up to 3 business days. We ask that you follow-up with your pharmacy.

## 2024-05-22 ENCOUNTER — Other Ambulatory Visit (HOSPITAL_COMMUNITY): Payer: Self-pay

## 2024-05-22 ENCOUNTER — Telehealth: Payer: Self-pay

## 2024-05-22 ENCOUNTER — Other Ambulatory Visit: Payer: Self-pay | Admitting: Internal Medicine

## 2024-05-22 DIAGNOSIS — E781 Pure hyperglyceridemia: Secondary | ICD-10-CM

## 2024-05-22 MED ORDER — RYBELSUS 14 MG PO TABS: 14.0000 mg | ORAL_TABLET | Freq: Every day | ORAL | 1 refills | Status: DC

## 2024-05-22 MED ORDER — OMEGA-3-ACID ETHYL ESTERS 1 G PO CAPS: 2.0000 | ORAL_CAPSULE | Freq: Two times a day (BID) | ORAL | 1 refills | Status: AC

## 2024-05-22 NOTE — Telephone Encounter (Signed)
 Pharmacy Patient Advocate Encounter   Received notification from Onbase that prior authorization for omega-3 acid ethyl esters (LOVAZA ) 1 g capsule  is required/requested.   Insurance verification completed.   The patient is insured through [facility].   Per test claim: Per test claim, medication is not covered due to plan/benefit exclusion, PA not submitted at this time PLEASE SEE TEST BILLING BELOW    SEE TEST BILLING BELOW  icosapent  Ethyl (VASCEPA ) 1 g capsule IS IN THE SAME CLASS OF DRUG BRAND NAME is preferred by the insurance.  If suggested medication is appropriate, Please send in a new RX and discontinue this one. If not, please advise as to why it's not appropriate so that we may request a Prior Authorization. Please note, some preferred medications may still require a PA.  If the suggested medications have not been trialed and there are no contraindications to their use, the PA will not be submitted, as it will not be approved.

## 2024-05-22 NOTE — Telephone Encounter (Signed)
 Can we change this medication to the preferred one in this message ?

## 2024-05-29 NOTE — Telephone Encounter (Signed)
 done

## 2024-05-29 NOTE — Telephone Encounter (Signed)
Medication has been changed

## 2024-07-17 ENCOUNTER — Ambulatory Visit: Admitting: Internal Medicine

## 2024-07-18 ENCOUNTER — Other Ambulatory Visit: Payer: Self-pay | Admitting: Internal Medicine

## 2024-07-18 ENCOUNTER — Ambulatory Visit: Payer: Self-pay | Admitting: Internal Medicine

## 2024-07-18 ENCOUNTER — Encounter: Payer: Self-pay | Admitting: Internal Medicine

## 2024-07-18 VITALS — BP 136/80 | HR 73 | Temp 98.1°F | Ht 66.0 in | Wt 192.0 lb

## 2024-07-18 DIAGNOSIS — E1159 Type 2 diabetes mellitus with other circulatory complications: Secondary | ICD-10-CM | POA: Diagnosis not present

## 2024-07-18 DIAGNOSIS — I2510 Atherosclerotic heart disease of native coronary artery without angina pectoris: Secondary | ICD-10-CM | POA: Diagnosis not present

## 2024-07-18 DIAGNOSIS — I10 Essential (primary) hypertension: Secondary | ICD-10-CM | POA: Diagnosis not present

## 2024-07-18 DIAGNOSIS — E118 Type 2 diabetes mellitus with unspecified complications: Secondary | ICD-10-CM

## 2024-07-18 DIAGNOSIS — E785 Hyperlipidemia, unspecified: Secondary | ICD-10-CM

## 2024-07-18 DIAGNOSIS — Z794 Long term (current) use of insulin: Secondary | ICD-10-CM

## 2024-07-18 DIAGNOSIS — E1129 Type 2 diabetes mellitus with other diabetic kidney complication: Secondary | ICD-10-CM | POA: Diagnosis not present

## 2024-07-18 DIAGNOSIS — Z23 Encounter for immunization: Secondary | ICD-10-CM

## 2024-07-18 DIAGNOSIS — E781 Pure hyperglyceridemia: Secondary | ICD-10-CM | POA: Diagnosis not present

## 2024-07-18 DIAGNOSIS — R809 Proteinuria, unspecified: Secondary | ICD-10-CM

## 2024-07-18 DIAGNOSIS — Z7985 Long-term (current) use of injectable non-insulin antidiabetic drugs: Secondary | ICD-10-CM

## 2024-07-18 LAB — URINALYSIS, ROUTINE W REFLEX MICROSCOPIC
Bilirubin Urine: NEGATIVE
Hgb urine dipstick: NEGATIVE
Ketones, ur: NEGATIVE
Leukocytes,Ua: NEGATIVE
Nitrite: NEGATIVE
RBC / HPF: NONE SEEN
Specific Gravity, Urine: 1.01 (ref 1.000–1.030)
Total Protein, Urine: NEGATIVE
Urine Glucose: >=1000 — AB
Urobilinogen, UA: 0.2 (ref 0.0–1.0)
WBC, UA: NONE SEEN
pH: 6 (ref 5.0–8.0)

## 2024-07-18 LAB — CBC WITH DIFFERENTIAL/PLATELET
Basophils Absolute: 0 10*3/uL (ref 0.0–0.1)
Basophils Relative: 0.8 % (ref 0.0–3.0)
Eosinophils Absolute: 0.1 10*3/uL (ref 0.0–0.7)
Eosinophils Relative: 2.7 % (ref 0.0–5.0)
HCT: 41.6 % (ref 39.0–52.0)
Hemoglobin: 13.8 g/dL (ref 13.0–17.0)
Lymphocytes Relative: 31.4 % (ref 12.0–46.0)
Lymphs Abs: 1.7 10*3/uL (ref 0.7–4.0)
MCHC: 33.1 g/dL (ref 30.0–36.0)
MCV: 79.4 fl (ref 78.0–100.0)
Monocytes Absolute: 0.4 10*3/uL (ref 0.1–1.0)
Monocytes Relative: 7.3 % (ref 3.0–12.0)
Neutro Abs: 3.1 10*3/uL (ref 1.4–7.7)
Neutrophils Relative %: 57.8 % (ref 43.0–77.0)
Platelets: 166 10*3/uL (ref 150.0–400.0)
RBC: 5.24 Mil/uL (ref 4.22–5.81)
RDW: 14.8 % (ref 11.5–15.5)
WBC: 5.3 10*3/uL (ref 4.0–10.5)

## 2024-07-18 LAB — BASIC METABOLIC PANEL WITH GFR
BUN: 12 mg/dL (ref 6–23)
CO2: 30 meq/L (ref 19–32)
Calcium: 9.9 mg/dL (ref 8.4–10.5)
Chloride: 102 meq/L (ref 96–112)
Creatinine, Ser: 0.72 mg/dL (ref 0.40–1.50)
GFR: 98.84 mL/min
Glucose, Bld: 196 mg/dL — ABNORMAL HIGH (ref 70–99)
Potassium: 3.7 meq/L (ref 3.5–5.1)
Sodium: 139 meq/L (ref 135–145)

## 2024-07-18 LAB — LIPID PANEL
Cholesterol: 155 mg/dL (ref 28–200)
HDL: 36.6 mg/dL — ABNORMAL LOW
LDL Cholesterol: 69 mg/dL (ref 10–99)
NonHDL: 118.26
Total CHOL/HDL Ratio: 4
Triglycerides: 244 mg/dL — ABNORMAL HIGH (ref 10.0–149.0)
VLDL: 48.8 mg/dL — ABNORMAL HIGH (ref 0.0–40.0)

## 2024-07-18 LAB — HEPATIC FUNCTION PANEL
ALT: 18 U/L (ref 3–53)
AST: 16 U/L (ref 5–37)
Albumin: 4.3 g/dL (ref 3.5–5.2)
Alkaline Phosphatase: 45 U/L (ref 39–117)
Bilirubin, Direct: 0.1 mg/dL (ref 0.1–0.3)
Total Bilirubin: 0.5 mg/dL (ref 0.2–1.2)
Total Protein: 7.2 g/dL (ref 6.0–8.3)

## 2024-07-18 LAB — MICROALBUMIN / CREATININE URINE RATIO
Creatinine,U: 34.8 mg/dL
Microalb Creat Ratio: 97.6 mg/g — ABNORMAL HIGH (ref 0.0–30.0)
Microalb, Ur: 3.4 mg/dL — ABNORMAL HIGH (ref 0.7–1.9)

## 2024-07-18 LAB — HEMOGLOBIN A1C: Hgb A1c MFr Bld: 9.7 % — ABNORMAL HIGH (ref 4.6–6.5)

## 2024-07-18 MED ORDER — RYBELSUS 14 MG PO TABS: 14.0000 mg | ORAL_TABLET | Freq: Every day | ORAL | 0 refills | Status: AC

## 2024-07-18 MED ORDER — TOUJEO SOLOSTAR 300 UNIT/ML ~~LOC~~ SOPN: 20.0000 [IU] | PEN_INJECTOR | Freq: Every day | SUBCUTANEOUS | 0 refills | Status: AC

## 2024-07-18 MED ORDER — FREESTYLE LIBRE 3 PLUS SENSOR MISC: 1.0000 | 1 refills | Status: AC

## 2024-07-18 MED ORDER — INSULIN PEN NEEDLE 32G X 6 MM MISC: 1.0000 | Freq: Every day | 0 refills | Status: AC

## 2024-07-18 MED ORDER — METFORMIN HCL ER 750 MG PO TB24: 750.0000 mg | ORAL_TABLET | Freq: Every day | ORAL | 0 refills | Status: AC

## 2024-07-18 NOTE — Progress Notes (Signed)
 "  Subjective:  Patient ID: [name], male    DOB: 11/15/1960  Age: 64 y.o. MRN: [id number]  CC: Hypertension, Diabetes, and Hyperlipidemia  HPI [name] presents for f/up  ---  Discussed the use of AI scribe software for clinical note transcription with the patient, who gave verbal consent to proceed.  History of Present Illness [name] is a 64 year old male who presents for medication management and routine follow-up.  No current symptoms of chest pain, shortness of breath, dizziness, or lightheadedness. He reports that he has always been thirsty and drinks about twenty bottles of water a day, but does not experience frequent urination despite his high water intake. He does not experience frequent urination despite his high water intake.  He is experiencing issues with his medication coverage, specifically with vascepa  and lovaza , which are no longer covered by his insurance. He received a letter regarding this but has misplaced it. He is unsure if further action is required on his part.  No symptoms associated with high triglycerides, such as nausea, vomiting, abdominal pain, or weight loss. His last EKG was conducted last summer.  He mentions that he is due for an eye exam, as his last one was in January of the previous year.     Outpatient Medications Prior to Visit  Medication Sig Dispense Refill   aspirin  EC 81 MG tablet Take 1 tablet (81 mg total) by mouth daily. Swallow whole. 90 tablet 1   Cholecalciferol  (VITAMIN D3) 50 MCG (2000 UT) TABS Take 1 tablet by mouth daily.     clopidogrel  (PLAVIX ) 75 MG tablet Take 1 tablet (75 mg total) by mouth daily. 90 tablet 3   dapagliflozin  propanediol (FARXIGA ) 10 MG TABS tablet Take 1 tablet (10 mg total) by mouth daily before breakfast. 90 tablet 1   icosapent  Ethyl (VASCEPA ) 1 g capsule Take 2 capsules (2 g total) by mouth 2 (two) times daily. NEEDS APPOINTMENT FOR REFILLS. 120 capsule 0   metoprolol  tartrate  (LOPRESSOR ) 25 MG tablet TAKE 1 TABLET BY MOUTH TWICE A DAY 180 tablet 3   nitroGLYCERIN  (NITROSTAT ) 0.4 MG SL tablet Place 1 tablet (0.4 mg total) under the tongue every 5 (five) minutes as needed for chest pain. 25 tablet 11   olmesartan  (BENICAR ) 20 MG tablet TAKE 1 TABLET BY MOUTH EVERY DAY 90 tablet 1   omega-3 acid ethyl esters (LOVAZA ) 1 g capsule Take 2 capsules (2 g total) by mouth 2 (two) times daily. 360 capsule 1   rosuvastatin  (CRESTOR ) 40 MG tablet TAKE 1 TABLET BY MOUTH EVERY DAY 90 tablet 1   metFORMIN  (GLUCOPHAGE -XR) 750 MG 24 hr tablet TAKE 2 TABLETS (1,500 MG TOTAL) BY MOUTH EVERY DAY WITH BREAKFAST 60 tablet 1   Semaglutide  (RYBELSUS ) 14 MG TABS Take 1 tablet (14 mg total) by mouth daily. 30 tablet 1   No facility-administered medications prior to visit.    ROS Review of Systems  Constitutional:  Negative for appetite change, chills, diaphoresis, fatigue and fever.  HENT: Negative.    Eyes: Negative.  Negative for visual disturbance.  Respiratory: Negative.  Negative for cough, chest tightness, shortness of breath and wheezing.   Cardiovascular:  Negative for chest pain, palpitations and leg swelling.  Gastrointestinal: Negative.  Negative for abdominal pain, constipation, diarrhea, nausea and vomiting.  Endocrine: Positive for polydipsia and polyuria. Negative for polyphagia.  Genitourinary:  Positive for frequency. Negative for decreased urine volume, difficulty urinating, hematuria and urgency.  Musculoskeletal: Negative.  Negative for arthralgias and myalgias.  Skin: Negative.   Neurological:  Negative for dizziness, speech difficulty, weakness and light-headedness.  Hematological:  Negative for adenopathy. Does not bruise/bleed easily.  Psychiatric/Behavioral: Negative.      Objective:  BP 136/80 (BP Location: Left Arm, Patient Position: Sitting, Cuff Size: Normal)   Pulse 73   Temp 98.1 F (36.7 C) (Oral)   Ht 5' 6 (1.676 m)   Wt 192 lb (87.1 kg)   SpO2  97%   BMI 30.99 kg/m   BP Readings from Last 3 Encounters:  07/18/24 136/80  02/08/24 125/79  01/18/24 120/68    Wt Readings from Last 3 Encounters:  07/18/24 192 lb (87.1 kg)  02/08/24 184 lb (83.5 kg)  01/18/24 183 lb (83 kg)    Physical Exam Vitals reviewed.  Constitutional:      General: He is not in acute distress.    Appearance: Normal appearance. He is not toxic-appearing or diaphoretic.  HENT:     Nose: Nose normal.     Mouth/Throat:     Mouth: Mucous membranes are moist.  Eyes:     General: No scleral icterus.    Conjunctiva/sclera: Conjunctivae normal.  Cardiovascular:     Rate and Rhythm: Normal rate and regular rhythm.     Heart sounds: No murmur heard.    No friction rub. No gallop.  Pulmonary:     Effort: Pulmonary effort is normal.     Breath sounds: No stridor. No wheezing, rhonchi or rales.  Abdominal:     General: Abdomen is flat. Bowel sounds are normal. There is no distension.     Palpations: There is no mass.     Tenderness: There is no abdominal tenderness. There is no guarding.     Hernia: No hernia is present.  Musculoskeletal:        General: Normal range of motion.     Cervical back: Neck supple.     Right lower leg: No edema.     Left lower leg: No edema.  Lymphadenopathy:     Cervical: No cervical adenopathy.  Skin:    General: Skin is warm and dry.  Neurological:     General: No focal deficit present.     Mental Status: He is alert.  Psychiatric:        Mood and Affect: Mood normal.        Behavior: Behavior normal.     Lab Results  Component Value Date   WBC 5.3 07/18/2024   HGB 13.8 07/18/2024   HCT 41.6 07/18/2024   PLT 166.0 07/18/2024   GLUCOSE 196 (H) 07/18/2024   CHOL 155 07/18/2024   TRIG 244.0 (H) 07/18/2024   HDL 36.60 (L) 07/18/2024   LDLDIRECT 52.0 09/15/2022   LDLCALC 69 07/18/2024   ALT 18 07/18/2024   AST 16 07/18/2024   NA 139 07/18/2024   K 3.7 07/18/2024   CL 102 07/18/2024   CREATININE 0.72  07/18/2024   BUN 12 07/18/2024   CO2 30 07/18/2024   TSH 2.26 09/28/2023   PSA 0.43 09/28/2023   INR 0.9 ratio 04/22/2009   HGBA1C 9.7 (H) 07/18/2024   MICROALBUR 3.4 (H) 07/18/2024    DG Chest 2 View Result Date: 01/25/2024 EXAM: 2 VIEW(S) XRAY OF THE CHEST 01/18/2024 09:30:00 AM COMPARISON: 08/07/2016 CLINICAL HISTORY: Abnormal lung exam; former smoker. FINDINGS: LUNGS AND PLEURA: No focal pulmonary opacity. No pulmonary edema. No pleural effusion. No pneumothorax. HEART AND MEDIASTINUM: No acute abnormality of the cardiac  and mediastinal silhouettes. BONES AND SOFT TISSUES: No acute osseous abnormality. IMPRESSION: 1. No acute process. Electronically signed by: Norman Gatlin MD 01/25/2024 03:00 AM EDT RP Workstation: [id number]    Assessment & Plan:  Hyperlipidemia LDL goal <70 -     Lipid panel; Future -     Hepatic function panel; Future  Essential hypertension- BP is well controlled. -     Urinalysis, Routine w reflex microscopic; Future -     Basic metabolic panel with GFR; Future -     CBC with Differential/Platelet; Future  Type II diabetes mellitus with manifestations (HCC) -     Microalbumin / creatinine urine ratio; Future -     Hemoglobin A1c; Future -     Basic metabolic panel with GFR; Future -     Ambulatory referral to Ophthalmology -     metFORMIN  HCl ER; Take 1 tablet (750 mg total) by mouth daily with breakfast.  Dispense: 180 tablet; Refill: 0 -     Rybelsus ; Take 1 tablet (14 mg total) by mouth daily.  Dispense: 90 tablet; Refill: 0 -     AMB Referral [facility]BCI Care Management  Pure hyperglyceridemia- Will start basal insulin . -     Lipid panel; Future -     AMB Referral VBCI Care Management  Microalbuminuria due to type 2 diabetes mellitus (HCC)- Blood sugar is to high to restart an SGLT-2 inh. -     Microalbumin / creatinine urine ratio; Future  Flu vaccine need -     Flu vaccine trivalent PF, 6mos and older(Flulaval,Afluria,Fluarix,Fluzone)  Coronary  artery disease involving native coronary artery of native heart without angina pectoris -     Ambulatory referral to Cardiology  Type 2 diabetes mellitus with other circulatory complication, with long-term current use of insulin  (HCC)- A1C is up to 9.7%. He has s/s of glucose toxicity. Will add basal insulin . -     Toujeo  SoloStar; Inject 20 Units into the skin daily.  Dispense: 6 mL; Refill: 0 -     FreeStyle Libre 3 Plus Sensor; Apply 1 Act topically every 14 (fourteen) days. Change sensor every 15 days.  Dispense: 6 each; Refill: 1 -     Insulin  Pen Needle; 1 Act by Does not apply route daily.  Dispense: 100 each; Refill: 0     Follow-up: Return in about 3 months (around 10/16/2024).  Debby Molt, MD "

## 2024-07-18 NOTE — Patient Instructions (Signed)

## 2024-07-19 ENCOUNTER — Telehealth: Payer: Self-pay

## 2024-07-19 NOTE — Progress Notes (Signed)
 Care Guide Pharmacy Note  07/19/2024 Name: [name] MRN: [id number] DOB: [birth year]  Referred By: Joshua Debby CROME, MD Reason for referral: Call Attempt #1 and Complex Care Management (Outreach to sch ref w/ pharm)   [name] is a 64 y.o. year old male who is a primary care patient of Joshua Debby CROME, MD.  Brodan Grewell was referred to the pharmacist for assistance related to: HTN and HLD  Successful contact was made with the patient to discuss pharmacy services.  Patient declines engagement at this time. Contact information was provided to the patient should they wish to reach out for assistance at a later time. Spoke w/ Abby and he requested for me to contact his wife as he is unware of the medications he currently takes and would not know if resources are needed. Afterwards, I made successful contact with Ricka and she declined services.   Doyce Razor [facility] Guide Direct Dial: [phone]  Fax: [phone]

## 2024-07-23 ENCOUNTER — Telehealth: Payer: Self-pay

## 2024-07-23 NOTE — Telephone Encounter (Signed)
 Please advise.

## 2024-07-24 NOTE — Telephone Encounter (Signed)
 FYI: Patient states that we can cancel all of his appointments because he is not coming back in the office and he is not doing anything with insulin .

## 2024-07-24 NOTE — Telephone Encounter (Signed)
 I don't know what to say.
